# Patient Record
Sex: Male | Born: 1945 | Race: White | Hispanic: No | Marital: Married | Smoking: Former smoker
Health system: Southern US, Community
[De-identification: ages and names within clinical notes are randomized; demographics above are authoritative.]

## PROBLEM LIST (undated history)

## (undated) DIAGNOSIS — J449 Chronic obstructive pulmonary disease, unspecified: Secondary | ICD-10-CM

## (undated) DIAGNOSIS — F419 Anxiety disorder, unspecified: Secondary | ICD-10-CM

## (undated) DIAGNOSIS — Z87442 Personal history of urinary calculi: Secondary | ICD-10-CM

## (undated) DIAGNOSIS — I10 Essential (primary) hypertension: Secondary | ICD-10-CM

## (undated) DIAGNOSIS — C801 Malignant (primary) neoplasm, unspecified: Secondary | ICD-10-CM

## (undated) DIAGNOSIS — R0602 Shortness of breath: Secondary | ICD-10-CM

## (undated) DIAGNOSIS — M1990 Unspecified osteoarthritis, unspecified site: Secondary | ICD-10-CM

## (undated) HISTORY — PX: EYE SURGERY: SHX253

## (undated) HISTORY — PX: OTHER SURGICAL HISTORY: SHX169

## (undated) HISTORY — PX: BACK SURGERY: SHX140

---

## 2001-06-17 ENCOUNTER — Emergency Department (HOSPITAL_COMMUNITY): Admission: EM | Admit: 2001-06-17 | Discharge: 2001-06-17 | Payer: Self-pay | Admitting: Emergency Medicine

## 2003-09-08 ENCOUNTER — Emergency Department (HOSPITAL_COMMUNITY): Admission: EM | Admit: 2003-09-08 | Discharge: 2003-09-08 | Payer: Self-pay | Admitting: Emergency Medicine

## 2003-09-15 ENCOUNTER — Encounter (INDEPENDENT_AMBULATORY_CARE_PROVIDER_SITE_OTHER): Payer: Self-pay | Admitting: Specialist

## 2003-09-15 ENCOUNTER — Ambulatory Visit (HOSPITAL_BASED_OUTPATIENT_CLINIC_OR_DEPARTMENT_OTHER): Admission: RE | Admit: 2003-09-15 | Discharge: 2003-09-15 | Payer: Self-pay | Admitting: Urology

## 2003-09-15 ENCOUNTER — Ambulatory Visit (HOSPITAL_COMMUNITY): Admission: RE | Admit: 2003-09-15 | Discharge: 2003-09-15 | Payer: Self-pay | Admitting: Urology

## 2004-01-12 ENCOUNTER — Emergency Department (HOSPITAL_COMMUNITY): Admission: EM | Admit: 2004-01-12 | Discharge: 2004-01-13 | Payer: Self-pay | Admitting: Emergency Medicine

## 2004-04-16 ENCOUNTER — Ambulatory Visit: Payer: Self-pay | Admitting: Internal Medicine

## 2004-06-26 ENCOUNTER — Ambulatory Visit: Payer: Self-pay | Admitting: Internal Medicine

## 2004-07-05 ENCOUNTER — Ambulatory Visit: Payer: Self-pay | Admitting: Gastroenterology

## 2004-07-17 ENCOUNTER — Ambulatory Visit (HOSPITAL_COMMUNITY): Admission: RE | Admit: 2004-07-17 | Discharge: 2004-07-17 | Payer: Self-pay | Admitting: Gastroenterology

## 2004-07-17 ENCOUNTER — Ambulatory Visit: Payer: Self-pay | Admitting: Gastroenterology

## 2004-08-06 ENCOUNTER — Ambulatory Visit: Payer: Self-pay | Admitting: Internal Medicine

## 2004-09-24 ENCOUNTER — Ambulatory Visit: Payer: Self-pay | Admitting: Internal Medicine

## 2004-10-17 ENCOUNTER — Ambulatory Visit: Payer: Self-pay | Admitting: Internal Medicine

## 2005-01-09 ENCOUNTER — Ambulatory Visit: Payer: Self-pay | Admitting: Internal Medicine

## 2005-01-15 ENCOUNTER — Ambulatory Visit: Payer: Self-pay

## 2005-02-19 ENCOUNTER — Ambulatory Visit: Payer: Self-pay | Admitting: Internal Medicine

## 2005-04-23 ENCOUNTER — Ambulatory Visit: Payer: Self-pay | Admitting: Internal Medicine

## 2005-06-23 ENCOUNTER — Ambulatory Visit: Payer: Self-pay | Admitting: Internal Medicine

## 2005-08-18 ENCOUNTER — Ambulatory Visit: Payer: Self-pay | Admitting: Internal Medicine

## 2005-10-20 ENCOUNTER — Ambulatory Visit: Payer: Self-pay | Admitting: Internal Medicine

## 2005-11-27 ENCOUNTER — Ambulatory Visit: Payer: Self-pay | Admitting: Internal Medicine

## 2006-01-26 ENCOUNTER — Ambulatory Visit: Payer: Self-pay | Admitting: Internal Medicine

## 2006-03-31 ENCOUNTER — Ambulatory Visit: Payer: Self-pay | Admitting: Internal Medicine

## 2006-05-25 ENCOUNTER — Ambulatory Visit: Payer: Self-pay | Admitting: Internal Medicine

## 2006-07-11 ENCOUNTER — Emergency Department (HOSPITAL_COMMUNITY): Admission: EM | Admit: 2006-07-11 | Discharge: 2006-07-11 | Payer: Self-pay | Admitting: Emergency Medicine

## 2006-07-13 ENCOUNTER — Ambulatory Visit: Payer: Self-pay | Admitting: Internal Medicine

## 2006-07-15 ENCOUNTER — Ambulatory Visit: Payer: Self-pay | Admitting: Internal Medicine

## 2006-07-17 ENCOUNTER — Inpatient Hospital Stay (HOSPITAL_COMMUNITY): Admission: EM | Admit: 2006-07-17 | Discharge: 2006-07-20 | Payer: Self-pay | Admitting: Emergency Medicine

## 2006-07-17 ENCOUNTER — Ambulatory Visit: Payer: Self-pay | Admitting: Internal Medicine

## 2006-07-19 ENCOUNTER — Ambulatory Visit: Payer: Self-pay | Admitting: Internal Medicine

## 2006-07-21 ENCOUNTER — Ambulatory Visit: Payer: Self-pay | Admitting: Internal Medicine

## 2006-07-21 ENCOUNTER — Inpatient Hospital Stay (HOSPITAL_COMMUNITY): Admission: EM | Admit: 2006-07-21 | Discharge: 2006-07-25 | Payer: Self-pay | Admitting: Emergency Medicine

## 2006-07-22 ENCOUNTER — Ambulatory Visit: Payer: Self-pay | Admitting: Vascular Surgery

## 2006-07-24 ENCOUNTER — Encounter: Payer: Self-pay | Admitting: Neurosurgery

## 2006-07-29 ENCOUNTER — Ambulatory Visit: Payer: Self-pay | Admitting: Internal Medicine

## 2006-08-14 ENCOUNTER — Ambulatory Visit (HOSPITAL_COMMUNITY): Admission: RE | Admit: 2006-08-14 | Discharge: 2006-08-14 | Payer: Self-pay | Admitting: Neurosurgery

## 2006-08-26 ENCOUNTER — Ambulatory Visit: Payer: Self-pay | Admitting: Internal Medicine

## 2006-09-03 ENCOUNTER — Ambulatory Visit: Payer: Self-pay | Admitting: Cardiology

## 2006-09-03 ENCOUNTER — Inpatient Hospital Stay (HOSPITAL_COMMUNITY): Admission: AC | Admit: 2006-09-03 | Discharge: 2006-09-16 | Payer: Self-pay

## 2006-09-03 ENCOUNTER — Ambulatory Visit: Payer: Self-pay | Admitting: Cardiothoracic Surgery

## 2006-09-04 ENCOUNTER — Ambulatory Visit: Payer: Self-pay | Admitting: Internal Medicine

## 2006-09-16 ENCOUNTER — Ambulatory Visit: Payer: Self-pay | Admitting: Psychiatry

## 2006-09-16 ENCOUNTER — Inpatient Hospital Stay (HOSPITAL_COMMUNITY): Admission: RE | Admit: 2006-09-16 | Discharge: 2006-09-18 | Payer: Self-pay | Admitting: Psychiatry

## 2006-09-24 ENCOUNTER — Ambulatory Visit: Payer: Self-pay | Admitting: Family Medicine

## 2006-10-01 ENCOUNTER — Ambulatory Visit: Payer: Self-pay | Admitting: Family Medicine

## 2006-10-06 ENCOUNTER — Ambulatory Visit (HOSPITAL_COMMUNITY): Admission: RE | Admit: 2006-10-06 | Discharge: 2006-10-06 | Payer: Self-pay | Admitting: Orthopedic Surgery

## 2006-10-12 ENCOUNTER — Ambulatory Visit: Payer: Self-pay | Admitting: Family Medicine

## 2006-10-12 ENCOUNTER — Encounter: Admission: RE | Admit: 2006-10-12 | Discharge: 2006-10-12 | Payer: Self-pay | Admitting: Family Medicine

## 2006-10-13 ENCOUNTER — Encounter: Admission: RE | Admit: 2006-10-13 | Discharge: 2006-10-13 | Payer: Self-pay | Admitting: Family Medicine

## 2006-10-27 ENCOUNTER — Ambulatory Visit: Payer: Self-pay | Admitting: Family Medicine

## 2006-10-28 ENCOUNTER — Encounter: Admission: RE | Admit: 2006-10-28 | Discharge: 2006-10-28 | Payer: Self-pay | Admitting: Family Medicine

## 2006-10-28 ENCOUNTER — Ambulatory Visit: Payer: Self-pay | Admitting: Family Medicine

## 2006-11-05 ENCOUNTER — Ambulatory Visit: Payer: Self-pay | Admitting: Family Medicine

## 2006-11-24 ENCOUNTER — Ambulatory Visit: Payer: Self-pay | Admitting: Cardiology

## 2006-11-24 LAB — CONVERTED CEMR LAB
Basophils Relative: 0.9 % (ref 0.0–1.0)
Bilirubin, Direct: 0.1 mg/dL (ref 0.0–0.3)
CO2: 20 meq/L (ref 19–32)
Cortisol, Plasma: 8.5 ug/dL
Creatinine, Ser: 2 mg/dL — ABNORMAL HIGH (ref 0.4–1.5)
Free T4: 0.9 ng/dL (ref 0.6–1.6)
GFR calc Af Amer: 44 mL/min
Glucose, Bld: 97 mg/dL (ref 70–99)
HCT: 31.4 % — ABNORMAL LOW (ref 39.0–52.0)
Hemoglobin: 10.7 g/dL — ABNORMAL LOW (ref 13.0–17.0)
Lymphocytes Relative: 24.2 % (ref 12.0–46.0)
Monocytes Absolute: 1 10*3/uL — ABNORMAL HIGH (ref 0.2–0.7)
Monocytes Relative: 9.4 % (ref 3.0–11.0)
Neutro Abs: 6.4 10*3/uL (ref 1.4–7.7)
Neutrophils Relative %: 60.1 % (ref 43.0–77.0)
Potassium: 4.7 meq/L (ref 3.5–5.1)
RDW: 16.7 % — ABNORMAL HIGH (ref 11.5–14.6)
Sodium: 134 meq/L — ABNORMAL LOW (ref 135–145)
T3, Free: 2.4 pg/mL (ref 2.3–4.2)
Total Bilirubin: 0.5 mg/dL (ref 0.3–1.2)
Total Protein: 7 g/dL (ref 6.0–8.3)

## 2006-11-25 ENCOUNTER — Ambulatory Visit: Payer: Self-pay | Admitting: Internal Medicine

## 2006-11-26 ENCOUNTER — Ambulatory Visit (HOSPITAL_COMMUNITY): Admission: RE | Admit: 2006-11-26 | Discharge: 2006-11-26 | Payer: Self-pay | Admitting: Cardiology

## 2006-12-01 ENCOUNTER — Ambulatory Visit (HOSPITAL_COMMUNITY): Admission: RE | Admit: 2006-12-01 | Discharge: 2006-12-01 | Payer: Self-pay | Admitting: Internal Medicine

## 2006-12-09 ENCOUNTER — Encounter: Payer: Self-pay | Admitting: Cardiology

## 2006-12-09 ENCOUNTER — Ambulatory Visit: Payer: Self-pay

## 2006-12-10 ENCOUNTER — Ambulatory Visit: Payer: Self-pay | Admitting: Internal Medicine

## 2006-12-16 ENCOUNTER — Ambulatory Visit: Payer: Self-pay | Admitting: Cardiology

## 2006-12-22 ENCOUNTER — Encounter: Payer: Self-pay | Admitting: Internal Medicine

## 2006-12-22 ENCOUNTER — Ambulatory Visit: Payer: Self-pay | Admitting: Internal Medicine

## 2006-12-26 DIAGNOSIS — F329 Major depressive disorder, single episode, unspecified: Secondary | ICD-10-CM

## 2006-12-26 DIAGNOSIS — I10 Essential (primary) hypertension: Secondary | ICD-10-CM

## 2006-12-26 DIAGNOSIS — R51 Headache: Secondary | ICD-10-CM | POA: Insufficient documentation

## 2006-12-26 DIAGNOSIS — K219 Gastro-esophageal reflux disease without esophagitis: Secondary | ICD-10-CM

## 2006-12-26 DIAGNOSIS — R519 Headache, unspecified: Secondary | ICD-10-CM | POA: Insufficient documentation

## 2007-01-04 ENCOUNTER — Ambulatory Visit: Payer: Self-pay | Admitting: Family Medicine

## 2007-01-18 ENCOUNTER — Ambulatory Visit: Payer: Self-pay | Admitting: Family Medicine

## 2007-01-26 ENCOUNTER — Encounter: Admission: RE | Admit: 2007-01-26 | Discharge: 2007-04-26 | Payer: Self-pay | Admitting: Family Medicine

## 2007-02-09 ENCOUNTER — Ambulatory Visit: Payer: Self-pay | Admitting: Family Medicine

## 2007-04-01 ENCOUNTER — Inpatient Hospital Stay (HOSPITAL_COMMUNITY): Admission: AD | Admit: 2007-04-01 | Discharge: 2007-04-03 | Payer: Self-pay | Admitting: General Surgery

## 2007-04-05 ENCOUNTER — Ambulatory Visit: Payer: Self-pay | Admitting: Family Medicine

## 2007-05-21 ENCOUNTER — Encounter (INDEPENDENT_AMBULATORY_CARE_PROVIDER_SITE_OTHER): Payer: Self-pay | Admitting: Urology

## 2007-05-21 ENCOUNTER — Ambulatory Visit (HOSPITAL_BASED_OUTPATIENT_CLINIC_OR_DEPARTMENT_OTHER): Admission: RE | Admit: 2007-05-21 | Discharge: 2007-05-21 | Payer: Self-pay | Admitting: Urology

## 2007-07-28 ENCOUNTER — Emergency Department (HOSPITAL_COMMUNITY): Admission: EM | Admit: 2007-07-28 | Discharge: 2007-07-28 | Payer: Self-pay | Admitting: Emergency Medicine

## 2007-10-02 DIAGNOSIS — F1010 Alcohol abuse, uncomplicated: Secondary | ICD-10-CM | POA: Insufficient documentation

## 2007-10-02 DIAGNOSIS — E785 Hyperlipidemia, unspecified: Secondary | ICD-10-CM | POA: Insufficient documentation

## 2007-10-02 DIAGNOSIS — R0602 Shortness of breath: Secondary | ICD-10-CM

## 2007-10-02 DIAGNOSIS — R5383 Other fatigue: Secondary | ICD-10-CM | POA: Insufficient documentation

## 2007-10-02 DIAGNOSIS — K222 Esophageal obstruction: Secondary | ICD-10-CM | POA: Insufficient documentation

## 2007-10-02 DIAGNOSIS — C679 Malignant neoplasm of bladder, unspecified: Secondary | ICD-10-CM | POA: Insufficient documentation

## 2007-10-02 DIAGNOSIS — S21109A Unspecified open wound of unspecified front wall of thorax without penetration into thoracic cavity, initial encounter: Secondary | ICD-10-CM | POA: Insufficient documentation

## 2007-10-02 DIAGNOSIS — R5381 Other malaise: Secondary | ICD-10-CM | POA: Insufficient documentation

## 2007-10-02 DIAGNOSIS — K208 Other esophagitis: Secondary | ICD-10-CM

## 2007-10-02 DIAGNOSIS — K449 Diaphragmatic hernia without obstruction or gangrene: Secondary | ICD-10-CM | POA: Insufficient documentation

## 2010-06-30 ENCOUNTER — Encounter: Payer: Self-pay | Admitting: Internal Medicine
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
## 2010-10-22 NOTE — Discharge Summary (Signed)
[name], [name]                ACCOUNT NO.:  [id number]   MEDICAL RECORD NO.:  [id number]          PATIENT TYPE:  INP   LOCATION:  3025                         FACILITY:  [facility]   PHYSICIAN:  Coletta Memos, M.D.     DATE OF BIRTH:  02-Mar-1946   DATE OF ADMISSION:  07/22/2006  DATE OF DISCHARGE:  07/25/2006                               DISCHARGE SUMMARY   ADMISSION DIAGNOSIS:  Right lower extremity pain.   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   PROCEDURE:  Right L3-4 semihemilaminectomy and diskectomy.   DISCHARGE STATUS:  Alive and well.   DISCHARGE DESTINATION:  Home.   MEDICATIONS:  Include:  1. Percocet.  2. Flexeril.   COMPLICATIONS:  None.  Wound clean, dry, and no signs of infection.   INDICATION:  [name] presented to the hospital on July 24, 2006.  He was at [facility] where he was admitted and I was asked to provide a  consultation secondary to right lower extremity pain.  A scan was done  and it showed that he had a herniated disk at L3-4 on the right side  which I felt was causing his pain in a far lateral position.  He  initially did not want surgery, then he went ahead and wanted to try an  epidural steroid injection.  He changed his mind and then decided he  wanted to have surgery.  He was actually admitted on July 22, 2006.  The fact that he was transferred to [facility] does not mean he was admitted  on the 15th since this is one hospital system, he was never discharged  from [facility].           ______________________________  Coletta Memos, M.D.     KC/MEDQ  D:  01/08/2007  T:  01/09/2007  Job:  [id number]

## 2010-10-22 NOTE — Assessment & Plan Note (Signed)
[facility]                            CARDIOLOGY OFFICE NOTE   NAME:Belknap, Nida Boatman                       MRN:          [id number]  DATE:12/16/2006                            DOB:          30-Jun-1945    FOLLOWUP VISIT   PRIMARY CARE PHYSICIAN:  Sharlot Gowda, M.D.   REASON FOR VISIT:  Followup testing.   HISTORY OF PRESENT ILLNESS:  I saw Mr. Hritz back in June.  His history  is detailed in my previous note.  I referred him for a number of studies  for further investigation of general weakness, dyspnea on exertion, and  increased heart rate.  From a cardiac perspective, he underwent an  echocardiogram, which demonstrated normal left ventricular systolic  function at 55-60% with possible mild hypokinesis of the basal inferior  posterior wall, but no major valvular abnormalities.  He did have  evidence of mild diastolic dysfunction.  An adenosine Myoview did not  indicate any obvious ischemia.  His ejection fraction was 59% and he had  some decreased activity in the inferior wall, which could have been  related to soft tissue attenuation, less likely an element of scarring.  These studies were, overall, low risk, and I reviewed these with the  patient and his daughter today.  He is not reporting any active chest  pain and actually states that he feels better after we cut back his  enalapril and discontinued his triamterine hydrochlorothiazide.  I also  had him undergo a ventilation perfusion lung scan, which was low  probability for pulmonary embolus.  These studies, including a chest x-  ray, suggested underlying emphysema.  Followup blood work showed a mild  anemia with a hemoglobin of 10.7, WBCs 10.7, platelets 501,000, sodium  134, potassium 4.7, BUN and creatinine 28 and 2.0, which was down from a  creatinine high of 2.4.  His TSH was normal at 1.94, and his liver  function tests were also normal.  Microscopic urinalysis showed no white  cells  or red cells.  His urine sodium was 114.  In reviewing all of this  information, I doubt that we need to proceed with any additional cardiac  testing.  He has a relatively low blood pressure at baseline, and some  degree of orthostasis on his last visit.  I suspect that he may have  been intravascularly volume depleted, and at this point I would not  reinstitute diuretic therapy.  It may even be that he needs to  discontinue enalapril all together.   ALLERGIES:  MORPHINE.   PRESENT MEDICATIONS:  1. Megace 625 mg p.o. daily.  2. Citalopram 4 mg p.o. daily.  3. Enalapril 10 mg p.o. daily.   REVIEW OF SYSTEMS:  As described in the history of present illness.   EXAMINATION:  Blood pressure is 104/66, heart rate is 88, weight is 138  pounds.  Of note, his weight has increased 5 pounds since his last visit  and his heart rate has come down nicely from 106 to 88.  There has been no major change in his baseline examination other  than  this.   IMPRESSION/RECOMMENDATIONS:  1. Relative fatigue, dyspnea on exertion, and elevated heart rate,      largely improved in the last few weeks.  I suspect that      intravascular volume depletion was at least a fair part of these      symptoms based on testing and his progress following      discontinuation of diuretic therapy and decrease in his enalapril.      I doubt that additional cardiac testing is required at this point,      based on his relatively low risk studies.  I have recommended that      he continue to follow up with Dr. Susann Givens for close blood pressure      evaluation.  It may be that he needs to be taken off of enalapril      all together, although at this point he seems to still be      improving.  I would not reinstitute diuretics at this point.  His      heart rate has improved and he had no evidence of thromboembolic      disease based on a low-probability ventilation perfusion scan.  We      can see him back as needed.  2.  Continue regular followup with Dr. Susann Givens.     Jonelle Sidle, MD  Electronically Signed    SGM/MedQ  DD: 12/16/2006  DT: 12/17/2006  Job #: [id number]   cc:   Sharlot Gowda, M.D.
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
## 2010-10-22 NOTE — Op Note (Signed)
[name], [name]                ACCOUNT NO.:  [id number]   MEDICAL RECORD NO.:  [id number]          PATIENT TYPE:  AMB   LOCATION:  [facility]                         FACILITY:  [facility]   PHYSICIAN:  Mark C. Vernie Ammons, M.D.  DATE OF BIRTH:  [birth year]   DATE OF PROCEDURE:  05/21/2007  DATE OF DISCHARGE:                               OPERATIVE REPORT   PREOPERATIVE DIAGNOSIS:  Recurrent bladder tumor.   POSTOPERATIVE DIAGNOSIS:  Recurrent bladder tumor.   PROCEDURES:  1. Cystoscopy with urethral dilatation.  2. Transurethral resection of bladder tumor (approximately 1 cm).  3. Bladder biopsies.   SURGEON:  Mark C. Vernie Ammons, M.D.   ANESTHESIA:  General.   SPECIMENS:  1. Bladder tumor.  2. Cold cup biopsy of posterior wall.  3. Cold cup biopsy near right ureteral orifice.   All to pathology.   BLOOD LOSS:  Minimal.   DRAINS:  None.   COMPLICATIONS:  None.   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  It was found at the time of an incidental ureteroscopy for a right  ureteral stone.  He was scheduled for surveillance cystoscopy, however  failed to return.  When he returned to my office earlier this month,  cystoscopy was performed and the bladder tumor was identified.  We  discussed the risks, complications and alternatives to surgical  resection of the tumor.  He understands and has elected to proceed.   DESCRIPTION OF OPERATION:  After informed consent, the patient brought  to the major OR, placed on the table, administered general anesthesia,  then moved to the dorsal lithotomy position.  His genitalia was  sterilely prepped and draped and an official time-out was performed.   I initially attempted to pass the 28-French resectoscope with the  Timberlake obturator but the meatus would not accept this easily.  I  therefore dilated with Sissy Hoff sounds starting at 24-French  and  progressing to 30-French.  I was then able to pass the sheath with  obturator.  I then removed the obturator and inserted the resectoscope  element with 30-degree lens and then passed the scope on down the  urethra, through the prostatic urethra into the bladder.   The bladder was then fully inspected.  I found the tumor on the right  wall.  It did not appear to involve the bladder neck.  It was papillary  in configuration, and then I noted some areas that were slightly redder  than the surrounding mucosa, primarily near the right ureteral orifice,  posterior and lateral to that.  There were no definite papillary tumors.  There was also with a patch of this reddened area on the posterior wall  just to the right of midline.   Resection was then performed of the bladder tumor.  Once this was fully  resected, I then fulgurated the edges and the base.   The cold cup biopsy forceps were then inserted and I obtained a cold cup  biopsy of the reddened the  area of the posterior wall just to the right  of midline and also a biopsy just superior and lateral to the right  ureteral orifice.  I then reinserted the resectoscope element and  fulgurated these two areas, and the remaining areas of slight reddened  mucosa were fully fulgurated until no areas were left untreated in the  bladder.  I then drained the bladder and the specimens were sent to  pathology.  At the end of the procedure there was some bleeding from the  meatus at the 6 o'clock position that was treated with gauze and  pressure with good control.  The patient tolerated the procedure well.  There were no intraoperative complications.   He will be given a prescription for 24 Vicodin HP and 24 Pyridium Plus.  He will be discharged from the recovery area with written instructions  and follow up in my office in 1-2 weeks to discuss the pathology report  and further treatment based upon that.      Mark C. Vernie Ammons, M.D.   Electronically Signed     MCO/MEDQ  D:  05/21/2007  T:  05/21/2007  Job:  [id number]

## 2010-10-25 NOTE — Op Note (Signed)
[name], [name]                          ACCOUNT NO.:  [id number]   MEDICAL RECORD NO.:  [id number]                   PATIENT TYPE:  AMB   LOCATION:  [facility]C                                 FACILITY:  [facility]   PHYSICIAN:  Mark C. Vernie Ammons, M.D.               DATE OF BIRTH:  [birth year]   DATE OF PROCEDURE:  09/15/2003  DATE OF DISCHARGE:                                 OPERATIVE REPORT   PREOPERATIVE DIAGNOSIS:  Right distal ureteral calculus.   POSTOPERATIVE DIAGNOSES:  1. Right distal ureteral calculus.  2. Bladder tumor (approximately 2 cm).   OPERATION/PROCEDURE:  1. Cystoscopy.  2. Right retrograde pyelogram with interpretation.  3. Right ureteroscopy with laser lithotripsy of stone.  4. Stent placement.  5. Transurethral resection of bladder tumor.   SURGEON:  Mark C. Vernie Ammons, M.D.   ASSISTANT:  Thyra Breed, M.D.   ANESTHESIA:  General.   DRAINS:  18-French Foley catheter.   ESTIMATED BLOOD LOSS:  Approximately 5 mL.   SPECIMENS:  Bladder tumor to pathology.   COMPLICATIONS:  None.   INDICATIONS:  The patient is a 65 year old white male who was seen in office  consultation today for further evaluation of right flank pain with radiation  into the testis and associated gross hematuria and irritative voiding  symptoms.  His CT scan revealed hydronephrosis, bilateral renal calculi and  a right renal cyst as well as 5 mm stone in the distal right ureter.  He is  brought to the OR today for ureteroscopic extraction of his stone.  He  understands the risks, complications, alternatives, and limitations.   DESCRIPTION OF PROCEDURE:  After informed consent was obtained, the patient  was brought to the major OR and placed on the table, administered general  anesthesia, then moved to the dorsal lithotomy position.  His genitalia was  sterilely prepped and draped.  Initially a 23-French cystoscope with 12-  degree lens was inserted in the bladder.  The bladder was fully  inspected  and noted to have a large bladder tumor that was papillary in configuration  but there was some nodularity just posterior to it.  It encompassed the area  of the right ureteral orifice and extended to near the bladder neck level at  the 8 o'clock position.  The patient was administered indigo carmine IV and  the remainder of the bladder was then fully inspected with both 70-degree  and 12-degree lenses and noted to be free of any further tumor, stones or  inflammatory lesions.   The right orifice was easily identified and I observed for an extended  period of time the area that I felt was consistent with a right ureteral  orifice and eventually saw some blue dye emanating from this region.  I was  able to get an open-ended ureteral catheter into this area, injected  contrast and determined that this was in fact the right ureteral orifice.  I  was unable to negotiate a guidewire up the right ureter through the open-  ended stent, so I removed the cystoscope and stent and inserted the 6-French  rigid ureteroscope.  I was able to identify the orifice and passed the  ureteroscope into the orifice and identified the stone impacted in the  distal ureter.  I tried to pass a guidewire through the scope and around the  stone but was unsuccessful.  I, therefore, used the holmium laser to  fragment the stone completely.  I was able to get the scope past the stone  and noted no lesions within the ureter.  The stone was fully fragmented and  I, therefore, passed a guidewire through the ureteroscope into the renal  pelvis.  The scope was backed out leaving the guidewire in place and noted  there did not appear to be any intraluminal filling defects or abnormalities  in the intramural ureteral region.   The open-ended ureteral stent was then passed over the guidewire into the  renal pelvis.  The guidewire was removed and then I inserted the  resectoscope.  I used the resectoscope to resect  the tumor from the bladder  and completely around the ureteral orifice.  I used only pure cut in the  area of the orifice.  To prevent any risk of scarring over the tube, did  encompass the entire ureteral orifice.  I resected circumferentially around  this and then up to the bladder neck at the 8 o'clock position where the  tumor extended.  After I was complete, all visible papillary tumor was  completely resected.  I fulgurated the base and all bleeders.  I then used  the [facility] evacuator to remove all of the bladder tumor from the bladder.  I  then reinspected and noted the bladder to be intact with no perforation.  I  backloaded the cystoscope over the open-ended stent, passed the guidewire up  the ureter, removed the open-ended stent and passed a double-J stent into  the renal pelvis.  A 68-French 66 year old stent was used.  No string was  left affixed.  I removed the guidewire with a good curl being noted in the  bladder and renal pelvis.  I then drained the bladder and put an 18-French  Foley catheter in.  This was connected to closed system drainage.   Mitomycin 40 mL in 40 mL of saline was then instilled in the bladder and  maintained in the recovery room.  The catheter will be removed and the  bladder irrigated and the patient will be given a voiding trial prior to his  discharge.  I will contact him with the results of his pathology and plan to  see him back in the office in approximately one week.  He has a previously  prescribed for [facility].  I will give him a prescription for  Pyridium.                                               Mark C. Vernie Ammons, M.D.    MCO/MEDQ  D:  09/15/2003  T:  09/15/2003  Job:  [id number]

## 2010-10-25 NOTE — H&P (Signed)
[name], [name]                ACCOUNT NO.:  [id number]   MEDICAL RECORD NO.:  [id number]          PATIENT TYPE:  INP   LOCATION:  0115                         FACILITY:  [facility]   PHYSICIAN:  Georgina Quint. Plotnikov, MDDATE OF BIRTH:  [birth year]   DATE OF ADMISSION:  07/21/2006  DATE OF DISCHARGE:                              HISTORY & PHYSICAL   CHIEF COMPLAINT:  Severe pain in the back, severe pain in the right  thigh. I am ready to shoot a bullet in my head. He started to complain  of chest/epigastric pain earlier today. They are nonexertional and get  better with drinking water.   HISTORY OF PRESENT ILLNESS:  The patient is a 65 year old male with  about 2 weeks history of severe refractory to outpatient treatment low  back pain and right leg pain. He was hospitalized prior and on last  weekend for pain control and a workup. MRI did not show any significant  pathology. He was seen by Dr. Lequita Halt and discharged with  recommendations to see a neurosurgeon, Dr. Venetia Maxon. His family brings the  patient back in with the above complaint. He is totally miserable,  unable to function. He has baseline severe pain in the back and in the  leg and severe jolt of sharp pain when he moves around.   Past medical history, allergies, social history, family history, review  of systems, and current medicines as per my history and physical on  July 17, 2006.   CURRENT MEDICATIONS:  Reviewed and he is currently taking Duragesic,  oxycodone and diazepam for back pain.   REVIEW OF SYSTEMS:  Severe pain as above. Pain medications do not work.  He is mixed up in days due to exhausting pain and pain medications,  unable to sleep. He sounds quite desperate. The rest of the 18-point  review of systems is fairly negative.   PHYSICAL EXAMINATION:  VITAL SIGNS:  Blood pressure 145/86, temperature  99.1, pulse 104, weight 164 pounds.  GENERAL:  He is in moderate acute distress due to pain, flushed  face,  dysphoric.  HEENT:  Slightly dryish oral mucosa.  NECK:  Supple.  LUNGS:  Clear.  HEART:  Regular.  ABDOMEN:  Soft, nontender.  EXTREMITIES:  Lower extremity is without edema. Calves nontender. LS  spine tender to palpation. Right thigh anteriorly slightly tender to  palpation. When asked to walk to the exam table, he gets a spell of pain  that almost puts him down on his knees. Straight leg elevation is  negative. Hip, pelvis and thigh exam fairly unremarkable. He is very  uncomfortable when trying to lay down or get up from supine position.  SKIN:  Without rash.   ASSESSMENT/PLAN:  1. Severe refractory low back pain/right lower extremity pain. His      previous hospitalization MRI results were reviewed with the      patient. Will have to readmit for pain control and neurosurgical      consultation. Will ask Dr. Venetia Maxon or his colleagues to evaluate the      patient. May need a CT myelogram or  other test to define his      pathology      a little better. Will increase Duragesic patch.  2. Atypical chest pain. Will admit to telemetry, rule out myocardial      infarction, cardiac enzymes x3, EKG, D-dimer, start aspirin,      Lovenox.      Georgina Quint. Plotnikov, MD  Electronically Signed     AVP/MEDQ  D:  07/21/2006  T:  07/22/2006  Job:  [id number]

## 2010-10-25 NOTE — Discharge Summary (Signed)
[name], [name]                ACCOUNT NO.:  [id number]   MEDICAL RECORD NO.:  [id number]          PATIENT TYPE:  INP   LOCATION:  1514                         FACILITY:  [facility]   PHYSICIAN:  [name], MDDATE OF BIRTH:  [birth year]   DATE OF ADMISSION:  07/17/2006  DATE OF DISCHARGE:  07/20/2006                               DISCHARGE SUMMARY   DISCHARGE DIAGNOSES:  1. Severe and intractable right greater than left lower extremity pain      with low back pain, no definitive cause identified, see details      below.  Continue pain management with outpatient orthopedic      followup.  2. Shallow L5-S1 right disk protrusion, no definite entrapment, no      spinal stenosis on MRI February 9, done at [facility].  3. Mild dehydration with acute renal insufficiency resolved status      post IV fluid, discharge creatinine 1.1.  4. Hypertension.  5. Hyperglycemia secondary to steroids.  Outpatient followup to      include checking A1c.  6. Tobacco abuse, the patient educated on need for cessation.  7. Constipation likely narcotic-induced.   DISCHARGE MEDICATIONS:  1. Duragesic 50 mcg patch change q. 72 hours.  2. Prednisone taper 60 mg today then 50 and 40, etc. status post IV      Solu-Medrol dosing this admission.  3. Robaxin 750 mg 1 t.i.d. scheduled for spasms the next 3 days then      p.r.n.  Other medications are as prior to admission without change and include:  1. Valium 5 mg p.r.n. anxiety.  2. Elavil 25 mg q.h.s.  3. Enalapril 20 mg b.i.d.  4. Oxycodone 15 mg 1-2 q.i.d. p.r.n. breakthrough pain.  5. Omeprazole 40 mg p.o. q.a.m.  6. Maxzide 37.5/25 p.o. q.a.m.  7. Atenolol 25 mg b.i.d.  8. Wellbutrin SR 150 mg p.o. b.i.d.   Hospital followup is to be arranged with primary care physician, Dr.  Sonda Primes, to call for appointment in 2 weeks or as needed.  Also  with orthopedist Dr. Ollen Gross to call for appointment in 3-4 weeks  or as needed.   CONDITION ON DISCHARGE:  Pain improved, understands plans for followup  as needed.   HOSPITAL COURSE:  1. Intractable low back pain with right greater than left extremity      pain.  The patient is a 65 year old gentleman with a history of      hypertension and ongoing tobacco abuse who has had progressive pain      in his back down his right leg through his hip causing spasm from      his quad and above his knee.  He has been treated on outpatient      basis with empiric prednisone, Valium, and increasing doses of      oxycodone by his primary MD with little improvement or change in      the nature of his help.  Because of persisting pain with failure to      respond to outpatient conservative medical management, he was  admitted for further evaluation and control of his pain.  An MRI      was ordered which initially could not be performed due to the      patient's inability to lie flat due to pain.  He was treated with      high-dose IV Solu-Medrol as well as IV Dilaudid and the placement      of a Duragesic patch this hospitalization which did improve his      pain.  An MRI was performed February 9 which was suboptimal due to      motion artifact but with adequate to deny the presence of spinal      stenosis or a disk herniation.  There was mild L5-S1 protrusion on      the right without impingement or edema.  However, as this was      located on the right side where a majority of the patient's      complaints were located, it was felt appropriate to be seen in      consultation by orthopedics and Dr. Lequita Halt, who was on call, saw      the patient for further evaluation.  He also ordered a plain film      of the pelvis and hip to rule out osteoarthritis or avascular      necrosis, both of which were excluded by normal findings on plain      film.  I also placed the patient on scheduled Robaxin 750 t.i.d.      for the spasms, the combination of which has seemed to have       improved his pain.  His IV Solu-Medrol was tapered and then changed      back to p.o. prednisone on the day of discharge.  He still has      flares of his pain, but orthopedics feels at this time there is no      particularly convincing explanation for the patient's source of      pain with relatively benign MRI findings and negative x-ray films.      Continue current pain management as tolerated with plans for      outpatient followup to consider an epidural steroid injection in      this region should pain persist.  The patient is agreeable to this      plan and anxious for discharge home.  We will continue the      Duragesic as well as recurring prednisone taper as prior to      admission and Robaxin for muscle spasm.  The patient is allowed to      remain taking his Valium additionally for anxiety component related      to pain and oxycodone p.r.n. breakthrough pain above and beyond      that which is already covered with the above.  2. Other medical issues.  The patient's other medical issues remained      stable this hospitalization.  He did receive IV fluids while      holding his Maxzide due to mild dehydration but at that time of      discharge, is resumed on all previous medications.      Connor A. Felicity Coyer, MD  Electronically Signed     VAL/MEDQ  D:  07/20/2006  T:  07/20/2006  Job:  [id number]

## 2010-10-25 NOTE — Op Note (Signed)
[name], [name]                ACCOUNT NO.:  [id number]   MEDICAL RECORD NO.:  [id number]          PATIENT TYPE:  INP   LOCATION:  3025                         FACILITY:  [facility]   PHYSICIAN:  Coletta Memos, M.D.     DATE OF BIRTH:  05/23/1946   DATE OF PROCEDURE:  07/24/2006  DATE OF DISCHARGE:                               OPERATIVE REPORT   PREOPERATIVE DIAGNOSES:  1. Displaced disk right L3-4.  2. 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   POSTOPERATIVE DIAGNOSES:  1. Displaced disk right L3-4.  2. 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   PROCEDURE:  Right L3-4 semihemilaminectomy and diskectomy with  microdissection.   COMPLICATIONS:  None.   SURGEON:  Coletta Memos, M.D.   ASSISTANT:  Clydene Fake, M.D.   INDICATIONS:  [name] is a 65 year old gentleman who has been an  inpatient at [facility] transferred today.  He has been in  severe pain, has had two previous ER visits within the last three weeks  secondary to severe pain in his back and right lower extremity.  I  therefore recommended after reviewing his scans which showed a lateral  disk herniation at L3-4 and I thought operative decompression would be  best.  He has agreed.   OPERATIVE NOTE:  [name] was brought to the operating room intubated  and placed under general anesthetic without difficulty.  He was rolled  prone onto a Wilson frame and all pressure points were properly padded.  His back was prepped and he was draped in sterile fashion.  I  infiltrated 10 mL 0.5% lidocaine with 1:200,000 strength epinephrine  into the lumbar region.  I opened the skin with a #10 blade and took  this down to the thoracolumbar fascia.  I then exposed the lamina of L3  and L4, confirming our location with an intraoperative x-ray.  I then  performed a semihemilaminectomy of L3 using a high-speed drill and  Kerrison punches.  I removed the ligamentum flavum and expose the thecal  sac.  I brought the microscope into the operative  field.  I then was  able to retract the thecal sac medially and expose what was a very large  disk herniation.  With Dr. Doreen Beam assistance, we then removed the disk  in a progressive fashion using pituitary rongeurs, Kerrison punches and  Epstein curettes.  I decompressed the disk space until I had felt that  there was no pressure whatsoever left on the L3 or L4 nerve roots.  Bleeding was controlled.  I then irrigated the wound.  I then closed  wound in layered fashion.  Microdissection was used in order to protect  thecal sac and to thoroughly decompress both the thecal sac and L3-L4  nerve roots.  I  irrigated the wound.  I then closed the wound in layered fashion using  Vicryl sutures to reapproximate the thoracolumbar fascia, subcuticular  tissue, subcutaneous layer and subcuticular layer.  Dermabond was used  for sterile dressing.  The patient was extubated moving all extremities  postop.           ______________________________  Ronaldo Miyamoto  Franky Macho, M.D.     KC/MEDQ  D:  07/24/2006  T:  07/25/2006  Job:  [id number]

## 2010-10-25 NOTE — Consult Note (Signed)
[name]                ACCOUNT NO.:  [id number]   MEDICAL RECORD NO.:  [id number]          PATIENT TYPE:  INP   LOCATION:  1429                         FACILITY:  [facility]   PHYSICIAN:  Coletta Memos, M.D.     DATE OF BIRTH:  1945-07-08   DATE OF CONSULTATION:  07/22/2006  DATE OF DISCHARGE:                                 CONSULTATION   CHIEF COMPLAINT:  Right lower extremity pain.   INDICATIONS:  [name] is a 65 year old gentleman who first started  having pain on July 10, 2006.  At that point in time, he stated his  back and right leg had been hurting.  He was moving a cabinet in his  garage and stacked it.  Said that the pain did not start then but  started approximately a day later.  When he came into the emergency  room, he came in at 0108.  He was holding his back while ambulating, by  their account, to steady out his gait.  Family is at his bedside.  The  pain has been unrelenting at that time.  He had two separate admissions,  one on February 8th and the second on February 12th.  He has had a  number of tests to try to identify the etiology of his pain, including  CT angio of the chest.  He had hip x-rays.  He had a consultation by Dr.  Despina Hick.  He had an MRI which was read as not showing a clear-cut  etiology for his pain.  This was secondary to motion degradation of the  film; however, the MRI on my review certainly suggested a right L3-4  lateral disk.  He had a plain CT of the lumbar spine performed today.  That, in my point of view, confirms the right L3-4 lateral disk  herniation.  The radiologist, Dr. Chestine Spore, has read it the same.   [name] today is much more comfortable.  He is being treated with IV  pain medication.   CURRENT MEDICATIONS:  Morphine, Duragesic patch, oxycodone, Protonix,  Phenergan, Valium, aspirin, Lovenox.  He had been on steroids  previously.  Family reports he has had hiccups since the steroids have  started.   He has no  known drug allergies.   Home medications include Elavil, atenolol, bupropion, Valium, enalapril,  methocarbamol, prednisone, triamterene, hydrochlorothiazide, Xanax.   PAST MEDICAL HISTORY:  Hypertension.  He smokes.  Drinks occasionally.  He is on a full Lovenox protocol.   He is married.  He has two sons.   PHYSICAL EXAMINATION:  VITAL SIGNS:  Temperature 98.4, pulse 102,  respiratory rate 18, pulse oxygenation with 2 liters nasal cannula is  approximately 96%.  He had been in the 80s without nasal cannula.  Systolic blood pressure 128, diastolic 86.  He is 5 feet 6 inches tall,  weighing 72 kg.  GENERAL:  He is alert and oriented x4.  Answers all questions  appropriately.  He is in obvious distress.  When he stands, he favors  significantly to the right lower extremity, almost hopping on that side.  He had full strength in the right lower extremity on manual examination.  Reflexes are 2+ at the knees and ankles bilaterally.  Intact  proprioception.  Intact light touch.  Multiple tone, bulk, coordination  are normal.  He has symmetric facial movements.  Hearing intact to  finger rub bilaterally.  Full extraocular motions with his pupils.  Tongue and uvula in the midline.  Shoulder shrug is normal.  Strength  5/5 in the upper extremities.  LUNGS:  Clear.  HEART:  Regular rate and rhythm.  No murmurs or rubs appreciated.  EXTREMITIES:  Right groin inspection, where he is also complaining of  some pain, showed no abnormalities nor discoloration.  No edema was  present.   DIAGNOSIS:  1. Right far lateral disk herniation, L3-4.  2. Right L3 radiculopathy.   I spoke with [name] and his family for a long period of time.  We  went over the options, which I think are reasonable at this point in  time.  I told them that I think surgery is his best option right now  because he seems to be so miserable.  Secondary to the pain, I think an  operation could be safely done to remove the  disk fragment and relieve  the pressure on the nerve root and allow him to improve with regards to  the pain.  We also discussed a steroid injection, epidural steroid  injection to that L3-4 area.  This is what he would actually like to  pursue, as he is not at this time enthusiastic about an operation.  He  fully understands that it may take a while for that to take effect, and  it may not work at all, but there is little to no risk involved in  getting that injection.  I will go ahead and write an order for the  epidural steroid injection.  I have called Dr. Posey Rea and will speak  with him with regards to my findings, assessment, and plan.           ______________________________  Coletta Memos, M.D.     KC/MEDQ  D:  07/22/2006  T:  07/22/2006  Job:  [id number]

## 2010-10-25 NOTE — H&P (Signed)
[name], RESS                ACCOUNT NO.:  [id number]   MEDICAL RECORD NO.:  [id number]          PATIENT TYPE:  INP   LOCATION:  0103                         [facility]AC[facility]  [facility]   PHYSICIAN:  Georgina Quint. Plotnikov, MDDATE OF BIRTH:  Nov 14, 1945   DATE OF ADMISSION:  07/17/2006  DATE OF DISCHARGE:                              HISTORY & PHYSICAL   CHIEF COMPLAINT:  Severe back pain and pain in the legs.   HISTORY OF PRESENT ILLNESS:  The patient is a 65 year old male with one  week history of increasingly worsening pain in the back and in the right  leg.  He rates his pain at 11/10.  He has not been able to sleep over  the past several nights.  Pain medicines were of very little help.  Yesterday we started him on a high dose of oral prednisone, Valium, and  higher dosages of oxycodone.  He may be feeling a tad better.  He was  early in the office and received an injection of Demerol/Phenergan.  Continues to be in a lot of pain.  We will admit him for pain control.   PAST MEDICAL HISTORY:  1. Chronic osteoarthritis.  2. Hypertension.  3. Tobacco smoking.  4. Depression.   ALLERGIES:  No known drug allergies.   SOCIAL HISTORY:  He is married.  He is on disability.  Lives with his  wife.  He is a smoker.  Denies alcohol.   FAMILY HISTORY:  Positive for heart disease.   REVIEW OF SYSTEMS:  No chest pain or shortness of breath.  He has been  constipated lately.  Pain, as above.  The rest of the 18-point review of  systems is negative.  No urinary retention.   CURRENT MEDICATIONS:  Clonidine.  Other blood pressure meds not  available at present.  Oxycodone 15 mg q.i.d.  Diazepam 10 mg 1-2 b.i.d.  Prednisone taper.   PHYSICAL EXAMINATION:  VITAL SIGNS:  Blood pressure 118/75, pulse 92,  temperature 97.3.  GENERAL:  He is in mild acute distress due to pain.  Very difficult to  ambulate.  Severe pain with minimal motion in the back.  HEENT:  Moist mucosa.  NECK:  Supple.  LUNGS:  Clear.  No wheezes.  HEART:  S1 and S2.  No gallop.  ABDOMEN:  Soft, nontender.  No organomegaly.  No mass felt.  EXTREMITIES:  Lower extremities without edema.  Calves nontender.  Straight leg elevation is positive, actually, bilaterally.  LS tender  with minimal range of motion.  Paraspinal muscles dense and tender.  SKIN:  Without rashes.  NEUROLOGIC:  Alert, oriented and cooperative.   LABS:  None available.   ASSESSMENT/PLAN:  1. Severe low back pain with right-sided radiculopathy refractory to      outpatient treatment.  Will switch to IV steroids.  MRI of the LS-      spine tonight.  May need a neurosurgical/orthopedic surgical      consult.  2. Radiculopathy:  Plan as above.  IV morphine.  Duragesic patch.      Diazepam p.r.n. spasms.  3. Hypertension:  Continue current therapy.  4. Tobacco abuse:  Will use a patch.      Georgina Quint. Plotnikov, MD  Electronically Signed     AVP/MEDQ  D:  07/17/2006  T:  07/17/2006  Job:  [id number]
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
## 2010-11-01 ENCOUNTER — Other Ambulatory Visit: Payer: Self-pay | Admitting: Gastroenterology

## 2011-02-28 LAB — DIFFERENTIAL
Basophils Absolute: 0.1
Lymphocytes Relative: 24
Lymphs Abs: 2.3
Monocytes Absolute: 0.7
Neutro Abs: 6.1

## 2011-02-28 LAB — COMPREHENSIVE METABOLIC PANEL
Albumin: 4.1
BUN: 8
Calcium: 9.4
Chloride: 101
Creatinine, Ser: 1.12
GFR calc non Af Amer: >60
Total Bilirubin: 0.4

## 2011-02-28 LAB — SAMPLE TO BLOOD BANK

## 2011-02-28 LAB — CBC
HCT: 42.3
MCHC: 33.6
MCV: 92.1
Platelets: 406 — ABNORMAL HIGH
RDW: 16.7 — ABNORMAL HIGH
WBC: 9.4

## 2011-02-28 LAB — URINALYSIS, ROUTINE W REFLEX MICROSCOPIC
Ketones, ur: NEGATIVE
Nitrite: NEGATIVE
Protein, ur: NEGATIVE

## 2011-02-28 LAB — LIPASE, BLOOD: Lipase: 31

## 2011-03-17 LAB — POCT HEMOGLOBIN-HEMACUE
Hemoglobin: 13.4
Operator id: 268271

## 2011-03-19 LAB — CBC
MCHC: 34
MCV: 92.2
Platelets: 444 — ABNORMAL HIGH
RDW: 14.5 — ABNORMAL HIGH

## 2011-03-19 LAB — COMPREHENSIVE METABOLIC PANEL
AST: 17
Albumin: 4
Calcium: 9.8
Creatinine, Ser: 1.11
GFR calc Af Amer: >60
GFR calc non Af Amer: >60
Total Protein: 6.6

## 2011-03-19 LAB — DIFFERENTIAL
Eosinophils Relative: 3
Lymphocytes Relative: 20
Lymphs Abs: 2
Monocytes Absolute: 1 — ABNORMAL HIGH

## 2011-05-03 ENCOUNTER — Emergency Department (HOSPITAL_COMMUNITY): Payer: Medicare Other

## 2011-05-03 ENCOUNTER — Encounter: Payer: Self-pay | Admitting: Nurse Practitioner

## 2011-05-03 ENCOUNTER — Emergency Department (HOSPITAL_COMMUNITY)
Admission: EM | Admit: 2011-05-03 | Discharge: 2011-05-03 | Disposition: A | Discharge status: Home / Self Care | Payer: Medicare Other | Attending: Emergency Medicine | Admitting: Emergency Medicine

## 2011-05-03 DIAGNOSIS — M545 Low back pain, unspecified: Secondary | ICD-10-CM | POA: Insufficient documentation

## 2011-05-03 DIAGNOSIS — M549 Dorsalgia, unspecified: Secondary | ICD-10-CM

## 2011-05-03 DIAGNOSIS — I10 Essential (primary) hypertension: Secondary | ICD-10-CM | POA: Insufficient documentation

## 2011-05-03 DIAGNOSIS — G8929 Other chronic pain: Secondary | ICD-10-CM | POA: Insufficient documentation

## 2011-05-03 DIAGNOSIS — M79609 Pain in unspecified limb: Secondary | ICD-10-CM | POA: Insufficient documentation

## 2011-05-03 DIAGNOSIS — M5380 Other specified dorsopathies, site unspecified: Secondary | ICD-10-CM | POA: Insufficient documentation

## 2011-05-03 DIAGNOSIS — M25569 Pain in unspecified knee: Secondary | ICD-10-CM | POA: Insufficient documentation

## 2011-05-03 DIAGNOSIS — Z79899 Other long term (current) drug therapy: Secondary | ICD-10-CM | POA: Insufficient documentation

## 2011-05-03 HISTORY — DX: Essential (primary) hypertension: I10

## 2011-05-03 MED ORDER — TRAMADOL HCL 50 MG PO TABS: 50.0000 mg | ORAL_TABLET | Freq: Four times a day (QID) | ORAL | Status: AC | PRN

## 2011-05-03 MED ORDER — METHOCARBAMOL 500 MG PO TABS: 500.0000 mg | ORAL_TABLET | Freq: Two times a day (BID) | ORAL | Status: AC

## 2011-05-03 MED ORDER — KETOROLAC TROMETHAMINE 60 MG/2ML IM SOLN
60.0000 mg | Freq: Once | INTRAMUSCULAR | Status: AC
  Administered 2011-05-03: 60 mg via INTRAMUSCULAR
  Filled 2011-05-03: qty 2

## 2011-05-03 MED ORDER — DEXAMETHASONE SODIUM PHOSPHATE 10 MG/ML IJ SOLN
10.0000 mg | Freq: Once | INTRAMUSCULAR | Status: AC
  Administered 2011-05-03: 10 mg via INTRAMUSCULAR
  Filled 2011-05-03: qty 1

## 2011-05-03 MED ORDER — METHOCARBAMOL 500 MG PO TABS
500.0000 mg | ORAL_TABLET | Freq: Once | ORAL | Status: AC
  Administered 2011-05-03: 500 mg via ORAL
  Filled 2011-05-03: qty 1

## 2011-05-03 NOTE — ED Provider Notes (Signed)
History     CSN: [id number] Arrival date & time: 05/03/2011  9:31 AM   First MD Initiated Contact with Patient 05/03/11 1007     HPI [name] is a 65 y.o. male complaining of bilateral lower back pain. States he has a significant history of chronic back pain for several years. Most recent episode of back pain began yesterday and has worsened since. States pain radiates to bilateral lower extremities down to knees. Otherwise denies numbness, tingling, weakness, saddle anesthesias, or perineal numbness, abdominal pain, urinary symptoms, fever, new injury. Patient replies he is using Goody powders for pain without relief. States pain is typical for his chronic back pain. Patient is a 65 y.o. male presenting with back pain. The history is provided by the patient.  Back Pain  This is a chronic problem. The current episode started yesterday. The problem occurs constantly. The problem has been gradually worsening. The pain is associated with no known injury. The pain is present in the lumbar spine. The quality of the pain is described as stabbing and shooting. The pain radiates to the right knee, right thigh, left knee and left thigh. The pain is severe. The symptoms are aggravated by certain positions, twisting and bending. Pertinent negatives include no chest pain, no fever, no numbness, no headaches, no abdominal pain, no bowel incontinence, no perianal numbness, no bladder incontinence, no dysuria, no pelvic pain, no leg pain, no paresthesias, no paresis, no tingling and no weakness. He has tried NSAIDs for the symptoms. The treatment provided no relief.    Past Medical History  Diagnosis Date  . Hypertension     History reviewed. No pertinent past surgical history.  History reviewed. No pertinent family history.  History  Substance Use Topics  . Smoking status: Current Everyday Smoker -- 1.0 packs/day  . Smokeless tobacco: Not on file  . Alcohol Use: No      Review of Systems    Constitutional: Negative for fever and chills.  HENT: Negative for neck pain.   Respiratory: Negative for cough and shortness of breath.   Cardiovascular: Negative for chest pain and palpitations.  Gastrointestinal: Negative for nausea, vomiting, abdominal pain and bowel incontinence.  Genitourinary: Negative for bladder incontinence, dysuria, hematuria, flank pain and pelvic pain.  Musculoskeletal: Positive for back pain. Negative for myalgias and gait problem.       Denies saddle anesthesias, perineal numbness, bowel incontinence, urinary incontinence  Neurological: Negative for dizziness, tingling, weakness, numbness, headaches and paresthesias.  All other systems reviewed and are negative.    Allergies  Review of patient's allergies indicates no known allergies.  Home Medications   Current Outpatient Rx  Name Route Sig Dispense Refill  . AMLODIPINE BESYLATE 5 MG PO TABS Oral Take 5 mg by mouth daily.      Marland Kitchen VALSARTAN 160 MG PO TABS Oral Take 160 mg by mouth daily.        BP 144/83  Pulse 91  Temp(Src) 97.9 F (36.6 C) (Oral)  Resp 20  SpO2 98%  Physical Exam  Constitutional: He is oriented to person, place, and time. He appears well-developed and well-nourished.  HENT:  Head: Normocephalic and atraumatic.  Eyes: Conjunctivae are normal. Pupils are equal, round, and reactive to light.  Neck: Normal range of motion. Neck supple.  Cardiovascular: Normal rate, regular rhythm and normal heart sounds.   Pulmonary/Chest: Effort normal and breath sounds normal.  Abdominal: Soft. Bowel sounds are normal.  Musculoskeletal:  Thoracic back: Normal.       Lumbar back: He exhibits decreased range of motion, bony tenderness, pain and spasm. He exhibits no swelling, no edema, no deformity, no laceration and normal pulse.       Pain mildly reproduced with palpation of the lumbar paraspinal area. States pain will radiate down bilateral lower extremities. Positive straight leg  raise. Full range of motion however of the lower extremities. Normal distal pulses and sensation. Normal strength distally. Ambulation normal.  Neurological: He is alert and oriented to person, place, and time.  Skin: Skin is warm and dry. No rash noted. No erythema. No pallor.  Psychiatric: He has a normal mood and affect. His behavior is normal.    ED Course  Procedures   Dg Lumbar Spine Complete  05/03/2011  *RADIOLOGY REPORT*  Clinical Data:  lower back pain  LUMBAR SPINE - COMPLETE 4+ VIEW  Comparison: 07/28/2007  Findings: Normal alignment of the lumbar spine.  The vertebral body heights and disc spaces are well preserved.  Mild multilevel disc space narrowing and ventral endplate spurring is noted.  The facet joints are well aligned.  IMPRESSION:  1.  No acute findings. 2.  Lumbar spondylosis noted.  Original Report Authenticated By: Rosealee Albee, M.D.      MDM   Thomasene Lot, PA 05/03/11 1116  Thomasene Lot, [place] 05/03/11 1146

## 2011-05-03 NOTE — ED Notes (Signed)
C/o lower back, hip and BLE pain onset yesterday am, reports hx of back pain. Denies new injuries. Ambulatory, MAE. Denies bowel/bladder changes.

## 2011-05-03 NOTE — ED Provider Notes (Signed)
Evaluation and management procedures were performed by the PA/NP under my supervision/collaboration.   Geneva Barrero, MD 05/03/11 1547 

## 2011-05-04 MED ORDER — BACLOFEN 10 MG PO TABS: 10.0000 mg | ORAL_TABLET | Freq: Three times a day (TID) | ORAL | Status: AC

## 2011-05-04 NOTE — ED Provider Notes (Signed)
pts insurance does not cover robaxin or flexiril, requesting baclofen.  Dorthula Matas, PA 05/04/11 2020

## 2011-05-05 NOTE — ED Notes (Signed)
Called in an Rx for Baclofen 10 mg, 1 tab TID, x 30 tabs.  Prescribed by T. Neva Seat PA.  Called into [facility] at [phone] due to expense of Robaxin & Flexeril.

## 2011-05-06 NOTE — ED Provider Notes (Signed)
Evaluation and management procedures were performed by the PA/NP under my supervision/collaboration.   Dione Booze, MD 05/06/11 [phone]

## 2013-10-03 ENCOUNTER — Emergency Department (HOSPITAL_COMMUNITY)
Admission: EM | Admit: 2013-10-03 | Discharge: 2013-10-03 | Disposition: A | Discharge status: Home / Self Care | Payer: Medicare Other | Attending: Emergency Medicine | Admitting: Emergency Medicine

## 2013-10-03 ENCOUNTER — Encounter (HOSPITAL_COMMUNITY): Payer: Self-pay | Admitting: Emergency Medicine

## 2013-10-03 DIAGNOSIS — Z79899 Other long term (current) drug therapy: Secondary | ICD-10-CM | POA: Insufficient documentation

## 2013-10-03 DIAGNOSIS — X503XXA Overexertion from repetitive movements, initial encounter: Secondary | ICD-10-CM | POA: Insufficient documentation

## 2013-10-03 DIAGNOSIS — Y9389 Activity, other specified: Secondary | ICD-10-CM | POA: Insufficient documentation

## 2013-10-03 DIAGNOSIS — X500XXA Overexertion from strenuous movement or load, initial encounter: Secondary | ICD-10-CM | POA: Insufficient documentation

## 2013-10-03 DIAGNOSIS — IMO0002 Reserved for concepts with insufficient information to code with codable children: Secondary | ICD-10-CM | POA: Insufficient documentation

## 2013-10-03 DIAGNOSIS — F17200 Nicotine dependence, unspecified, uncomplicated: Secondary | ICD-10-CM | POA: Insufficient documentation

## 2013-10-03 DIAGNOSIS — Y9289 Other specified places as the place of occurrence of the external cause: Secondary | ICD-10-CM | POA: Insufficient documentation

## 2013-10-03 DIAGNOSIS — M549 Dorsalgia, unspecified: Secondary | ICD-10-CM

## 2013-10-03 DIAGNOSIS — I10 Essential (primary) hypertension: Secondary | ICD-10-CM | POA: Insufficient documentation

## 2013-10-03 DIAGNOSIS — G8929 Other chronic pain: Secondary | ICD-10-CM

## 2013-10-03 DIAGNOSIS — Z9889 Other specified postprocedural states: Secondary | ICD-10-CM | POA: Insufficient documentation

## 2013-10-03 MED ORDER — KETOROLAC TROMETHAMINE 60 MG/2ML IM SOLN
60.0000 mg | Freq: Once | INTRAMUSCULAR | Status: DC
  Filled 2013-10-03: qty 2

## 2013-10-03 NOTE — ED Notes (Signed)
Pt reports lower back since Thursday. Is chronic but got worse after he did heavy lifting. Is ambulatory. Denies urinary or bowel issues. Is a x 4.

## 2013-10-03 NOTE — Discharge Instructions (Signed)
Back Pain, Adult Low back pain is very common. About 1 in 5 people have back pain.The cause of low back pain is rarely dangerous. The pain often gets better over time.About half of people with a sudden onset of back pain feel better in just 2 weeks. About 8 in 10 people feel better by 6 weeks.  CAUSES Some common causes of back pain include:  Strain of the muscles or ligaments supporting the spine.  Wear and tear (degeneration) of the spinal discs.  Arthritis.  Direct injury to the back. DIAGNOSIS Most of the time, the direct cause of low back pain is not known.However, back pain can be treated effectively even when the exact cause of the pain is unknown.Answering your caregiver's questions about your overall health and symptoms is one of the most accurate ways to make sure the cause of your pain is not dangerous. If your caregiver needs more information, he or she may order lab work or imaging tests (X-rays or MRIs).However, even if imaging tests show changes in your back, this usually does not require surgery. HOME CARE INSTRUCTIONS For many people, back pain returns.Since low back pain is rarely dangerous, it is often a condition that people can learn to [facility] their own.   Remain active. It is stressful on the back to sit or stand in one place. Do not sit, drive, or stand in one place for more than 30 minutes at a time. Take short walks on level surfaces as soon as pain allows.Try to increase the length of time you walk each day.  Do not stay in bed.Resting more than 1 or 2 days can delay your recovery.  Do not avoid exercise or work.Your body is made to move.It is not dangerous to be active, even though your back may hurt.Your back will likely heal faster if you return to being active before your pain is gone.  Pay attention to your body when you bend and lift. Many people have less discomfortwhen lifting if they bend their knees, keep the load close to their bodies,and  avoid twisting. Often, the most comfortable positions are those that put less stress on your recovering back.  Find a comfortable position to sleep. Use a firm mattress and lie on your side with your knees slightly bent. If you lie on your back, put a pillow under your knees.  Only take over-the-counter or prescription medicines as directed by your caregiver. Over-the-counter medicines to reduce pain and inflammation are often the most helpful.Your caregiver may prescribe muscle relaxant drugs.These medicines help dull your pain so you can more quickly return to your normal activities and healthy exercise.  Put ice on the injured area.  Put ice in a plastic bag.  Place a towel between your skin and the bag.  Leave the ice on for 15-20 minutes, 03-04 times a day for the first 2 to 3 days. After that, ice and heat may be alternated to reduce pain and spasms.  Ask your caregiver about trying back exercises and gentle massage. This may be of some benefit.  Avoid feeling anxious or stressed.Stress increases muscle tension and can worsen back pain.It is important to recognize when you are anxious or stressed and learn ways to manage it.Exercise is a great option. SEEK MEDICAL CARE IF:  You have pain that is not relieved with rest or medicine.  You have pain that does not improve in 1 week.  You have new symptoms.  You are generally not feeling well. SEEK  IMMEDIATE MEDICAL CARE IF:   You have pain that radiates from your back into your legs.  You develop new bowel or bladder control problems.  You have unusual weakness or numbness in your arms or legs.  You develop nausea or vomiting.  You develop abdominal pain.  You feel faint. Document Released: 05/26/2005 Document Revised: 11/25/2011 Document Reviewed: 10/14/2010 [employer] Patient Information 2014 [place], [place].  Chronic Back Pain  When back pain lasts longer than 3 months, it is called chronic back pain.People with  chronic back pain often go through certain periods that are more intense (flare-ups).  CAUSES Chronic back pain can be caused by wear and tear (degeneration) on different structures in your back. These structures include:  The bones of your spine (vertebrae) and the joints surrounding your spinal cord and nerve roots (facets).  The strong, fibrous tissues that connect your vertebrae (ligaments). Degeneration of these structures may result in pressure on your nerves. This can lead to constant pain. HOME CARE INSTRUCTIONS  Avoid bending, heavy lifting, prolonged sitting, and activities which make the problem worse.  Take brief periods of rest throughout the day to reduce your pain. Lying down or standing usually is better than sitting while you are resting.  Take over-the-counter or prescription medicines only as directed by your caregiver. SEEK IMMEDIATE MEDICAL CARE IF:   You have weakness or numbness in one of your legs or feet.  You have trouble controlling your bladder or bowels.  You have nausea, vomiting, abdominal pain, shortness of breath, or fainting. Document Released: 07/03/2004 Document Revised: 08/18/2011 Document Reviewed: 05/10/2011 [facility] Patient Information 2014 [place], [place].

## 2013-10-03 NOTE — ED Notes (Signed)
Gwenlyn Perking, PA at bedside for evaluation.

## 2013-10-03 NOTE — ED Provider Notes (Signed)
CSN: [id number]     Arrival date & time 10/03/13  1245 History  This chart was scribed for non-physician practitioner, Michele Mcalpine, PA-C, working with Wandra Arthurs, MD, by Sydell Axon, ED Scribe. This patient was seen in room TR06C/TR06C and the patient's care was started at 2:33 PM.  The history is provided by the patient. No language interpreter was used.   HPI Comments: [name] is a 68 y.o. male who presents to the Emergency Department with a chief complaint of chronic lower back pain with the most recent episode occuring 4 days ago. Patient reports symptoms worsened following heavy lifting occuring 4 days ago when he felt his back give out. He characterizes his pain as a sharp pain which shoots down to his legs, bilaterally. Additionally, patient reports that symptoms are made worse with certain movements and changes in position (twisting/bending). Patient is ambulatory. He denies any CP, SOB, bowel or bladder incontinence, numbness/tingling, fever, night sweats. Patient reports taking 1 hydrocodone/day for his chronic low back pain and states this has provided minimal relief for this new symptom presentation. PCP is at [facility].  Past Medical History  Diagnosis Date  . Hypertension    Past Surgical History  Procedure Laterality Date  . Back surgery     No family history on file. History  Substance Use Topics  . Smoking status: Current Every Day Smoker -- 1.00 packs/day  . Smokeless tobacco: Not on file  . Alcohol Use: No   Review of Systems  Constitutional: Negative for fever and chills.  Respiratory: Negative for shortness of breath.   Cardiovascular: Negative for chest pain.  Gastrointestinal: Negative for nausea, vomiting, abdominal pain and diarrhea.  Musculoskeletal: Positive for back pain. Negative for gait problem, neck pain and neck stiffness.  Neurological: Negative for dizziness, weakness, numbness and headaches.  A complete 10 system review of systems  was obtained and all systems are negative except as noted in the HPI and PMH.   Allergies  Review of patient's allergies indicates no known allergies.  Home Medications   Prior to Admission medications   Medication Sig Start Date End Date Taking? Authorizing Provider  amLODipine (NORVASC) 5 MG tablet Take 5 mg by mouth daily.      Historical Provider, MD  valsartan (DIOVAN) 160 MG tablet Take 160 mg by mouth daily.      Historical Provider, MD   Triage Vitals: BP 157/86  Pulse 103  Temp(Src) 98.1 F (36.7 C) (Oral)  Wt 147 lb (66.679 kg)  SpO2 97%  Physical Exam  Nursing note and vitals reviewed. Constitutional: He is oriented to person, place, and time. He appears well-developed and well-nourished. No distress.  HENT:  Head: Normocephalic and atraumatic.  Mouth/Throat: Oropharynx is clear and moist.  Eyes: Conjunctivae are normal.  Neck: Normal range of motion. Neck supple. No spinous process tenderness and no muscular tenderness present.  Cardiovascular: Normal rate, regular rhythm and normal heart sounds.   Pulmonary/Chest: Effort normal and breath sounds normal. No respiratory distress.  Musculoskeletal: Normal range of motion. He exhibits no edema and no tenderness.  Lower lumbar muscle nontender. No spinous process tenderness.  Neurological: He is alert and oriented to person, place, and time. He has normal strength.  Strength lower extremities 5/5 and equal bilateral. Sensation intact. Slow, but normal gait.  Skin: Skin is warm and dry. No rash noted. He is not diaphoretic.  Psychiatric: He has a normal mood and affect. His behavior is normal.  ED Course  Procedures (including critical care time)  COORDINATION OF CARE: 2:44 PM-Recommended patient to follow up with PCP to have an MRI performed. Treatment plan discussed with patient and patient agrees.  Labs Review Labs Reviewed - No data to display  Imaging Review No results found.   EKG Interpretation None       MDM   Final diagnoses:  Chronic back pain   Patient presenting with worsening chronic back pain after a mechanical injury of lifting. He is well appearing and in no apparent distress. No tachycardia on my exam. No red flags concerning patient's back pain. No s/s of central cord compression or cauda equina. Lower extremities are neurovascularly intact and patient is able to ambulate. He received narcotic pain medications, 30 day supply at a time from PCP. I discussed that he will need to call his PCP for further Pain medicine. Pt refusing toradol. Stable for discharge. Return precautions given. Patient states understanding of treatment care plan and is agreeable.    I personally performed the services described in this documentation, which was scribed in my presence. The recorded information has been reviewed and is accurate.    Illene Labrador, PA-C 10/03/13 [place], PA-C 10/03/13 [phone]

## 2013-10-04 NOTE — ED Provider Notes (Signed)
Medical screening examination/treatment/procedure(s) were performed by non-physician practitioner and as supervising physician I was immediately available for consultation/collaboration.   EKG Interpretation None        Wandra Arthurs, MD 10/04/13 [phone]

## 2013-11-02 ENCOUNTER — Other Ambulatory Visit (HOSPITAL_COMMUNITY): Payer: Self-pay | Admitting: Internal Medicine

## 2013-11-02 ENCOUNTER — Ambulatory Visit (HOSPITAL_COMMUNITY)
Admission: RE | Admit: 2013-11-02 | Discharge: 2013-11-02 | Disposition: A | Discharge status: Home / Self Care | Payer: Medicare Other | Source: Ambulatory Visit | Attending: Internal Medicine | Admitting: Internal Medicine

## 2013-11-02 DIAGNOSIS — R079 Chest pain, unspecified: Secondary | ICD-10-CM | POA: Insufficient documentation

## 2013-11-02 DIAGNOSIS — J209 Acute bronchitis, unspecified: Secondary | ICD-10-CM

## 2013-11-06 ENCOUNTER — Inpatient Hospital Stay (HOSPITAL_COMMUNITY)
Admission: EM | Admit: 2013-11-06 | Discharge: 2013-11-07 | DRG: 191 | Disposition: A | Discharge status: Home Health | Payer: Medicare Other | Attending: Internal Medicine | Admitting: Internal Medicine

## 2013-11-06 ENCOUNTER — Encounter (HOSPITAL_COMMUNITY): Payer: Self-pay | Admitting: Emergency Medicine

## 2013-11-06 ENCOUNTER — Emergency Department (HOSPITAL_COMMUNITY): Payer: Medicare Other

## 2013-11-06 DIAGNOSIS — E871 Hypo-osmolality and hyponatremia: Secondary | ICD-10-CM | POA: Diagnosis present

## 2013-11-06 DIAGNOSIS — D72829 Elevated white blood cell count, unspecified: Secondary | ICD-10-CM | POA: Diagnosis present

## 2013-11-06 DIAGNOSIS — Z79899 Other long term (current) drug therapy: Secondary | ICD-10-CM | POA: Diagnosis not present

## 2013-11-06 DIAGNOSIS — K222 Esophageal obstruction: Secondary | ICD-10-CM

## 2013-11-06 DIAGNOSIS — K208 Other esophagitis without bleeding: Secondary | ICD-10-CM

## 2013-11-06 DIAGNOSIS — F3289 Other specified depressive episodes: Secondary | ICD-10-CM

## 2013-11-06 DIAGNOSIS — S21109A Unspecified open wound of unspecified front wall of thorax without penetration into thoracic cavity, initial encounter: Secondary | ICD-10-CM

## 2013-11-06 DIAGNOSIS — R0902 Hypoxemia: Secondary | ICD-10-CM | POA: Diagnosis present

## 2013-11-06 DIAGNOSIS — R5381 Other malaise: Secondary | ICD-10-CM

## 2013-11-06 DIAGNOSIS — F17200 Nicotine dependence, unspecified, uncomplicated: Secondary | ICD-10-CM | POA: Diagnosis present

## 2013-11-06 DIAGNOSIS — I10 Essential (primary) hypertension: Secondary | ICD-10-CM | POA: Diagnosis present

## 2013-11-06 DIAGNOSIS — E785 Hyperlipidemia, unspecified: Secondary | ICD-10-CM

## 2013-11-06 DIAGNOSIS — R51 Headache: Secondary | ICD-10-CM

## 2013-11-06 DIAGNOSIS — R0602 Shortness of breath: Secondary | ICD-10-CM

## 2013-11-06 DIAGNOSIS — IMO0002 Reserved for concepts with insufficient information to code with codable children: Secondary | ICD-10-CM

## 2013-11-06 DIAGNOSIS — C679 Malignant neoplasm of bladder, unspecified: Secondary | ICD-10-CM

## 2013-11-06 DIAGNOSIS — T380X5A Adverse effect of glucocorticoids and synthetic analogues, initial encounter: Secondary | ICD-10-CM | POA: Diagnosis present

## 2013-11-06 DIAGNOSIS — F1010 Alcohol abuse, uncomplicated: Secondary | ICD-10-CM

## 2013-11-06 DIAGNOSIS — R5383 Other fatigue: Secondary | ICD-10-CM

## 2013-11-06 DIAGNOSIS — K449 Diaphragmatic hernia without obstruction or gangrene: Secondary | ICD-10-CM

## 2013-11-06 DIAGNOSIS — J42 Unspecified chronic bronchitis: Secondary | ICD-10-CM

## 2013-11-06 DIAGNOSIS — F329 Major depressive disorder, single episode, unspecified: Secondary | ICD-10-CM

## 2013-11-06 DIAGNOSIS — J441 Chronic obstructive pulmonary disease with (acute) exacerbation: Principal | ICD-10-CM | POA: Diagnosis present

## 2013-11-06 DIAGNOSIS — K219 Gastro-esophageal reflux disease without esophagitis: Secondary | ICD-10-CM

## 2013-11-06 HISTORY — DX: Malignant (primary) neoplasm, unspecified: C80.1

## 2013-11-06 HISTORY — DX: Anxiety disorder, unspecified: F41.9

## 2013-11-06 HISTORY — DX: Shortness of breath: R06.02

## 2013-11-06 HISTORY — DX: Unspecified osteoarthritis, unspecified site: M19.90

## 2013-11-06 LAB — CBC WITH DIFFERENTIAL/PLATELET
BASOS PCT: 1 % (ref 0–1)
Basophils Absolute: 0.1 10*3/uL (ref 0.0–0.1)
EOS ABS: 0.5 10*3/uL (ref 0.0–0.7)
Eosinophils Relative: 3 % (ref 0–5)
HCT: 41.5 % (ref 39.0–52.0)
Hemoglobin: 14.4 g/dL (ref 13.0–17.0)
Lymphocytes Relative: 13 % (ref 12–46)
Lymphs Abs: 2.1 10*3/uL (ref 0.7–4.0)
MCH: 31.1 pg (ref 26.0–34.0)
MCHC: 34.7 g/dL (ref 30.0–36.0)
MCV: 89.6 fL (ref 78.0–100.0)
Monocytes Absolute: 1.5 10*3/uL — ABNORMAL HIGH (ref 0.1–1.0)
Monocytes Relative: 9 % (ref 3–12)
Neutro Abs: 12.5 10*3/uL — ABNORMAL HIGH (ref 1.7–7.7)
Neutrophils Relative %: 74 % (ref 43–77)
PLATELETS: 497 10*3/uL — AB (ref 150–400)
RBC: 4.63 MIL/uL (ref 4.22–5.81)
RDW: 15.2 % (ref 11.5–15.5)
WBC: 16.6 10*3/uL — AB (ref 4.0–10.5)

## 2013-11-06 LAB — BASIC METABOLIC PANEL
BUN: 11 mg/dL (ref 6–23)
CO2: 24 mEq/L (ref 19–32)
CREATININE: 0.95 mg/dL (ref 0.50–1.35)
Calcium: 9.8 mg/dL (ref 8.4–10.5)
Chloride: 95 mEq/L — ABNORMAL LOW (ref 96–112)
GFR calc Af Amer: >90 mL/min (ref >90)
GFR, EST NON AFRICAN AMERICAN: 83 mL/min — AB (ref >90)
Glucose, Bld: 107 mg/dL — ABNORMAL HIGH (ref 70–99)
Potassium: 4.2 mEq/L (ref 3.7–5.3)
SODIUM: 133 meq/L — AB (ref 137–147)

## 2013-11-06 LAB — TROPONIN I

## 2013-11-06 LAB — PRO B NATRIURETIC PEPTIDE: Pro B Natriuretic peptide (BNP): 133 pg/mL — ABNORMAL HIGH (ref 0–125)

## 2013-11-06 MED ORDER — ACETAMINOPHEN 325 MG PO TABS
650.0000 mg | ORAL_TABLET | Freq: Four times a day (QID) | ORAL | Status: DC | PRN
  Administered 2013-11-06 – 2013-11-07 (×2): 650 mg via ORAL
  Filled 2013-11-06 (×2): qty 2

## 2013-11-06 MED ORDER — FOLIC ACID 1 MG PO TABS
1.0000 mg | ORAL_TABLET | Freq: Every day | ORAL | Status: DC
  Administered 2013-11-06 – 2013-11-07 (×2): 1 mg via ORAL
  Filled 2013-11-06 (×2): qty 1

## 2013-11-06 MED ORDER — SODIUM CHLORIDE 0.9 % IJ SOLN: 3.0000 mL | Freq: Two times a day (BID) | INTRAMUSCULAR | Status: DC

## 2013-11-06 MED ORDER — LEVOFLOXACIN IN D5W 750 MG/150ML IV SOLN
750.0000 mg | Freq: Once | INTRAVENOUS | Status: AC
  Administered 2013-11-06: 750 mg via INTRAVENOUS
  Filled 2013-11-06: qty 150

## 2013-11-06 MED ORDER — IPRATROPIUM BROMIDE 0.02 % IN SOLN
0.5000 mg | Freq: Once | RESPIRATORY_TRACT | Status: DC
  Administered 2013-11-06: 0.5 mg via RESPIRATORY_TRACT
  Filled 2013-11-06: qty 2.5

## 2013-11-06 MED ORDER — HYDROCODONE-ACETAMINOPHEN 5-325 MG PO TABS
1.0000 | ORAL_TABLET | Freq: Every day | ORAL | Status: DC
  Administered 2013-11-06 – 2013-11-07 (×2): 1 via ORAL
  Filled 2013-11-06 (×2): qty 1

## 2013-11-06 MED ORDER — PREDNISONE 20 MG PO TABS
60.0000 mg | ORAL_TABLET | Freq: Once | ORAL | Status: AC
  Administered 2013-11-06: 60 mg via ORAL
  Filled 2013-11-06: qty 3

## 2013-11-06 MED ORDER — IPRATROPIUM-ALBUTEROL 0.5-2.5 (3) MG/3ML IN SOLN
3.0000 mL | RESPIRATORY_TRACT | Status: DC
  Administered 2013-11-06 – 2013-11-07 (×2): 3 mL via RESPIRATORY_TRACT
  Filled 2013-11-06 (×2): qty 3

## 2013-11-06 MED ORDER — SENNOSIDES-DOCUSATE SODIUM 8.6-50 MG PO TABS: 1.0000 | ORAL_TABLET | Freq: Every evening | ORAL | Status: DC | PRN

## 2013-11-06 MED ORDER — BIOTENE DRY MOUTH MT LIQD
15.0000 mL | Freq: Two times a day (BID) | OROMUCOSAL | Status: DC
  Administered 2013-11-06 – 2013-11-07 (×2): 15 mL via OROMUCOSAL

## 2013-11-06 MED ORDER — LEVOFLOXACIN IN D5W 750 MG/150ML IV SOLN
750.0000 mg | INTRAVENOUS | Status: DC
  Administered 2013-11-07: 750 mg via INTRAVENOUS
  Filled 2013-11-06 (×2): qty 150

## 2013-11-06 MED ORDER — SODIUM CHLORIDE 0.9 % IV SOLN: INTRAVENOUS | Status: DC

## 2013-11-06 MED ORDER — ACETAMINOPHEN 650 MG RE SUPP: 650.0000 mg | Freq: Four times a day (QID) | RECTAL | Status: DC | PRN

## 2013-11-06 MED ORDER — ONDANSETRON HCL 4 MG PO TABS: 4.0000 mg | ORAL_TABLET | Freq: Four times a day (QID) | ORAL | Status: DC | PRN

## 2013-11-06 MED ORDER — BENZONATATE 100 MG PO CAPS
100.0000 mg | ORAL_CAPSULE | Freq: Three times a day (TID) | ORAL | Status: DC | PRN
  Filled 2013-11-06: qty 1

## 2013-11-06 MED ORDER — PREDNISONE 50 MG PO TABS
60.0000 mg | ORAL_TABLET | Freq: Every day | ORAL | Status: DC
  Administered 2013-11-07: 60 mg via ORAL
  Filled 2013-11-06 (×2): qty 1

## 2013-11-06 MED ORDER — IPRATROPIUM BROMIDE 0.02 % IN SOLN
0.5000 mg | RESPIRATORY_TRACT | Status: DC
  Administered 2013-11-06: 0.5 mg via RESPIRATORY_TRACT
  Filled 2013-11-06: qty 2.5

## 2013-11-06 MED ORDER — ALBUTEROL SULFATE (2.5 MG/3ML) 0.083% IN NEBU
5.0000 mg | INHALATION_SOLUTION | Freq: Once | RESPIRATORY_TRACT | Status: AC
  Administered 2013-11-06: 5 mg via RESPIRATORY_TRACT
  Filled 2013-11-06: qty 6

## 2013-11-06 MED ORDER — VITAMIN B-1 100 MG PO TABS
100.0000 mg | ORAL_TABLET | Freq: Every day | ORAL | Status: DC
  Administered 2013-11-06 – 2013-11-07 (×2): 100 mg via ORAL
  Filled 2013-11-06 (×2): qty 1

## 2013-11-06 MED ORDER — SODIUM CHLORIDE 0.9 % IV SOLN
INTRAVENOUS | Status: DC
  Administered 2013-11-06: 15:00:00 via INTRAVENOUS

## 2013-11-06 MED ORDER — ALBUTEROL SULFATE (2.5 MG/3ML) 0.083% IN NEBU
5.0000 mg | INHALATION_SOLUTION | RESPIRATORY_TRACT | Status: DC
  Administered 2013-11-06 (×2): 5 mg via RESPIRATORY_TRACT
  Filled 2013-11-06 (×2): qty 6

## 2013-11-06 MED ORDER — ENOXAPARIN SODIUM 40 MG/0.4ML ~~LOC~~ SOLN
40.0000 mg | SUBCUTANEOUS | Status: DC
  Administered 2013-11-06: 40 mg via SUBCUTANEOUS
  Filled 2013-11-06 (×2): qty 0.4

## 2013-11-06 MED ORDER — ONDANSETRON HCL 4 MG/2ML IJ SOLN: 4.0000 mg | Freq: Four times a day (QID) | INTRAMUSCULAR | Status: DC | PRN

## 2013-11-06 MED ORDER — IPRATROPIUM BROMIDE 0.02 % IN SOLN
0.5000 mg | Freq: Once | RESPIRATORY_TRACT | Status: AC
  Administered 2013-11-06: 0.5 mg via RESPIRATORY_TRACT
  Filled 2013-11-06: qty 2.5

## 2013-11-06 MED ORDER — ALBUTEROL SULFATE (2.5 MG/3ML) 0.083% IN NEBU
2.5000 mg | INHALATION_SOLUTION | RESPIRATORY_TRACT | Status: DC | PRN
Start: 2013-11-06 — End: 2013-11-07

## 2013-11-06 NOTE — ED Provider Notes (Signed)
CSN: [id number]     Arrival date & time 11/06/13  1138 History   First MD Initiated Contact with Patient 11/06/13 1148     Chief Complaint  Patient presents with  . Cough  . Shortness of Breath     (Consider location/radiation/quality/duration/timing/severity/associated sxs/prior Treatment) HPI Comments: Patient with no previous history of COPD however with prolonged history of smoking -- presents with complaint of shortness of breath and cough for the past 3 weeks. Cough is productive of green sputum. Patient is unable to do much activity at home since this began because he becomes very short of breath with any sort of activity. Patient denies fever, chest pain, nausea or vomiting. No abdominal pain or urinary symptoms. Patient was recently placed on a "Z-Pak" which he finished taking today. He has received no relief from this or from the [place] he was prescribed. Patient is not currently on oxygen at home. He is at 90% on room air upon arrival to the emergency department. Onset of symptoms acute. Course is constant. Activity makes the symptoms worse. Nothing makes it better.  The history is provided by the patient and the spouse.    Past Medical History  Diagnosis Date  . Hypertension    Past Surgical History  Procedure Laterality Date  . Back surgery     No family history on file. History  Substance Use Topics  . Smoking status: Current Every Day Smoker -- 1.00 packs/day  . Smokeless tobacco: Not on file  . Alcohol Use: No    Review of Systems  Constitutional: Negative for fever.  HENT: Negative for rhinorrhea and sore throat.   Eyes: Negative for redness.  Respiratory: Positive for cough and shortness of breath. Negative for chest tightness and wheezing.   Cardiovascular: Negative for chest pain and leg swelling.  Gastrointestinal: Negative for nausea, vomiting, abdominal pain and diarrhea.  Genitourinary: Negative for dysuria.  Musculoskeletal: Negative for myalgias.   Skin: Negative for rash.  Neurological: Negative for headaches.   Allergies  Review of patient's allergies indicates no known allergies.  Home Medications   Prior to Admission medications   Medication Sig Start Date End Date Taking? Authorizing Provider  amLODipine (NORVASC) 5 MG tablet Take 5 mg by mouth daily.      Historical Provider, MD  HYDROcodone-acetaminophen (NORCO/VICODIN) 5-325 MG per tablet Take 1 tablet by mouth daily.    Historical Provider, MD  valsartan (DIOVAN) 160 MG tablet Take 160 mg by mouth daily.      Historical Provider, MD   BP 130/90  Pulse 89  Temp(Src) 98.1 F (36.7 C) (Oral)  Resp 20  SpO2 90%  Physical Exam  Nursing note and vitals reviewed. Constitutional: He appears well-developed and well-nourished.  HENT:  Head: Normocephalic and atraumatic.  Mouth/Throat: Oropharynx is clear and moist.  Eyes: Conjunctivae are normal. Right eye exhibits no discharge. Left eye exhibits no discharge.  Neck: Normal range of motion. Neck supple. No JVD present.  Cardiovascular: Normal rate, regular rhythm and normal heart sounds.   No murmur heard. Pulmonary/Chest: Effort normal. No respiratory distress. He has wheezes (mild, scattered, end-expiratory). He has no rales.  Abdominal: Soft. There is no tenderness. There is no rebound and no guarding.  Neurological: He is alert.  Skin: Skin is warm and dry.  Psychiatric: He has a normal mood and affect.    ED Course  Procedures (including critical care time) Labs Review Labs Reviewed  CBC WITH DIFFERENTIAL - Abnormal; Notable for the following:  WBC 16.6 (*)    Platelets 497 (*)    Neutro Abs 12.5 (*)    Monocytes Absolute 1.5 (*)    All other components within normal limits  BASIC METABOLIC PANEL - Abnormal; Notable for the following:    Sodium 133 (*)    Chloride 95 (*)    Glucose, Bld 107 (*)    GFR calc non Af Amer 83 (*)    All other components within normal limits    Imaging Review Dg Chest  2 View  11/06/2013   CLINICAL DATA:  Cough and shortness of breath for 3 weeks  EXAM: CHEST  2 VIEW  COMPARISON:  11/02/2013  FINDINGS: The heart size and vascular pattern are normal. There is hyperinflation consistent with COPD. There is mild to moderate bilateral bronchitic change. There is no consolidation or effusion. Interstitial changes in the perihilar areas are progressive when compared to 2008.  IMPRESSION: No evidence of bacterial pneumonia. There is COPD with mild to moderate bilateral bronchitic change.   Electronically Signed   By: Skipper Cliche M.D.   On: 11/06/2013 13:48     EKG Interpretation None      Patient seen and examined. Work-up initiated. EKG reviewed. Medications ordered. D/w Dr. Venora Maples who will see.   Vital signs reviewed and are as follows: Filed Vitals:   11/06/13 1146  BP: 130/90  Pulse: 89  Temp: 98.1 F (36.7 C)  Resp: 20   2:23 PM Pt very SOB with ambulation with desat to 87%. Patient agreeable to admission.   2:38 PM Spoke with Dr. Tyrell Antonio who will see.   MDM   Final diagnoses:  Hypoxia  Chronic bronchitis   Patient likely with COPD exacerbation. Admit for hypoxia, SOB, in setting of COPD exacerbation with increased sputum production.     Carlisle Cater, PA-C 11/06/13 1440

## 2013-11-06 NOTE — Progress Notes (Signed)
Called ED for report Dietrich Pates, [place]) at [phone]. Awaiting pt arrival to unit.

## 2013-11-06 NOTE — ED Notes (Signed)
Pt is here with coughing and states chest pain with coughing.  Pt states thinks he has bronchitis, coughing up green sputum.  Smoker

## 2013-11-06 NOTE — Progress Notes (Signed)
Pt admitted to the unit at 1545. Pt mental status is A & O x 4. Pt oriented to room, staff, and call bell. Skin is intact. Full assessment charted in [facility]. Call bell within reach. Visitor guidelines reviewed w/ pt and/or family.

## 2013-11-06 NOTE — ED Provider Notes (Signed)
Medical screening examination/treatment/procedure(s) were conducted as a shared visit with non-physician practitioner(s) and myself.  I personally evaluated the patient during the encounter.   EKG Interpretation   Date/Time:  Sunday Nov 06 2013 11:43:36 EDT Ventricular Rate:  88 PR Interval:  146 QRS Duration: 78 QT Interval:  354 QTC Calculation: 428 R Axis:   76 Text Interpretation:  Normal sinus rhythm Biatrial enlargement Nonspecific  ST abnormality Abnormal ECG nonspecific st changes as compared to prior  ecg Confirmed by Jurgen Groeneveld  MD, Lennette Bihari ([id number]) on 11/06/2013 12:26:08 PM      Patient will be admitted for COPD exacerbation.  Hoy Morn, MD 11/06/13 [phone]

## 2013-11-06 NOTE — H&P (Signed)
[facility] History and Physical  [name] DOB: Jul 19, 1945 DOA: 11/06/2013  Referring physician: Dr Venora Maples.  PCP: Pcp Not In System   Chief Complaint: SOB  HPI: [name] is a 68 y.o. male with PMH significant for hypertension, current smoker who presents with 3 weeks history of SOB, productive cough. He saw his PCP 4 days prior to admission he was prescribe albuterol, Z pack. He continue to have worsening SOB for that reason he presents to ED. He also relates some chest pain after coughing. He is feeling better after receiving nebulizer treatments, prednisone. In the ED he was found to be hypoxic at 87. Chest x ray with finding consistent with COPD. Patient does not have prior diagnosis of COPD.     Review of Systems:  Negative, except as per HPI.    Past Medical History  Diagnosis Date  . Hypertension    Past Surgical History  Procedure Laterality Date  . Back surgery     Social History:  reports that he has been smoking.  He does not have any smokeless tobacco history on file. He reports that he does not drink alcohol or use illicit drugs.  No Known Allergies  Family history; no history of COPD or lung disease in his family.   Prior to Admission medications   Medication Sig Start Date End Date Taking? Authorizing Provider  albuterol (PROVENTIL HFA;VENTOLIN HFA) 108 (90 BASE) MCG/ACT inhaler Inhale 1-2 puffs into the lungs every 6 (six) hours as needed for wheezing or shortness of breath.   Yes Historical Provider, MD  amLODipine (NORVASC) 5 MG tablet Take 5 mg by mouth daily.     Yes Historical Provider, MD  benzonatate (TESSALON) 100 MG capsule Take 100 mg by mouth 3 (three) times daily as needed for cough.   Yes Historical Provider, MD  HYDROcodone-acetaminophen (NORCO/VICODIN) 5-325 MG per tablet Take 1 tablet by mouth daily.   Yes Historical Provider, MD  valsartan (DIOVAN) 160 MG tablet Take 160 mg by mouth daily.     Yes Historical  Provider, MD   Physical Exam: Filed Vitals:   11/06/13 1321  BP: 113/64  Pulse: 83  Temp:   Resp: 16    BP 113/64  Pulse 83  Temp(Src) 98.1 F (36.7 C) (Oral)  Resp 16  SpO2 87%  General:  Appears calm and comfortable Eyes: PERRL, normal lids, irises & conjunctiva ENT: grossly normal hearing, lips & tongue Neck: no LAD, masses or thyromegaly Cardiovascular: RRR, no m/r/g. No LE edema. Telemetry: SR, no arrhythmias  Respiratory: no crackles or wheezing, Normal respiratory effort. Abdomen: soft, ntnd Skin: no rash or induration seen on limited exam Musculoskeletal: grossly normal tone BUE/BLE Psychiatric: grossly normal mood and affect, speech fluent and appropriate Neurologic: grossly non-focal.          Labs on Admission:  Basic Metabolic Panel:  Recent Labs Lab 11/06/13 1220  NA 133*  K 4.2  CL 95*  CO2 24  GLUCOSE 107*  BUN 11  CREATININE 0.95  CALCIUM 9.8   Liver Function Tests: No results found for this basename: AST, ALT, ALKPHOS, BILITOT, PROT, ALBUMIN,  in the last 168 hours No results found for this basename: LIPASE, AMYLASE,  in the last 168 hours No results found for this basename: AMMONIA,  in the last 168 hours CBC:  Recent Labs Lab 11/06/13 1220  WBC 16.6*  NEUTROABS 12.5*  HGB 14.4  HCT 41.5  MCV 89.6  PLT 497*  Cardiac Enzymes: No results found for this basename: CKTOTAL, CKMB, CKMBINDEX, TROPONINI,  in the last 168 hours  BNP (last 3 results) No results found for this basename: PROBNP,  in the last 8760 hours CBG: No results found for this basename: GLUCAP,  in the last 168 hours  Radiological Exams on Admission: Dg Chest 2 View  11/06/2013   CLINICAL DATA:  Cough and shortness of breath for 3 weeks  EXAM: CHEST  2 VIEW  COMPARISON:  11/02/2013  FINDINGS: The heart size and vascular pattern are normal. There is hyperinflation consistent with COPD. There is mild to moderate bilateral bronchitic change. There is no consolidation  or effusion. Interstitial changes in the perihilar areas are progressive when compared to 2008.  IMPRESSION: No evidence of bacterial pneumonia. There is COPD with mild to moderate bilateral bronchitic change.   Electronically Signed   By: Skipper Cliche M.D.   On: 11/06/2013 13:48    EKG: Independently reviewed. Sinus rhythm, non specific T wave abnormalities.   Assessment/Plan Active Problems:   HYPERTENSION   COPD exacerbation   Leukocytosis   1-Acute COPD exacerbation; patient presents with SOB, wheezing on lung exam perform by ED, chest x ray negative for PNA. I will continue with albuterol, ipratropium, prednisone and Levaquin. Will need to repeat oxygen on ambulation tomorrow. Will also check BNP.   2-Hypertension; SBP in the 120 range. Will hold BP medications for now. Assess need to resume medications in am.   3-Hyponatremia; mild. IV fluids.   4-Chest pain; after coughing but due to age and risk factors will cycle cardiac enzymes.   5-Leukocytosis; chest x ray negative for PNA. On Levaquin for COPD exacerbation. Check ua.   Code Status: Presume full code.  Family Communication: care discussed with multiple family member at bedside.  Disposition Plan: admit for treatment COPS exacerbation, and hypoxemia. Hospital stay depending on symptoms improvement.   Time spent: 75 minutes.   [facility] Pager [phone]  Disclaimer: This note may have been dictated with voice recognition software. Similar sounding words can inadvertently be transcribed and this note may contain transcription errors which may not have been corrected upon publication of note

## 2013-11-07 DIAGNOSIS — J441 Chronic obstructive pulmonary disease with (acute) exacerbation: Secondary | ICD-10-CM

## 2013-11-07 DIAGNOSIS — D72829 Elevated white blood cell count, unspecified: Secondary | ICD-10-CM

## 2013-11-07 DIAGNOSIS — I10 Essential (primary) hypertension: Secondary | ICD-10-CM

## 2013-11-07 LAB — URINE MICROSCOPIC-ADD ON

## 2013-11-07 LAB — BASIC METABOLIC PANEL
BUN: 13 mg/dL (ref 6–23)
CALCIUM: 9.7 mg/dL (ref 8.4–10.5)
CHLORIDE: 98 meq/L (ref 96–112)
CO2: 24 meq/L (ref 19–32)
Creatinine, Ser: 0.96 mg/dL (ref 0.50–1.35)
GFR calc Af Amer: >90 mL/min (ref >90)
GFR calc non Af Amer: 83 mL/min — ABNORMAL LOW (ref >90)
GLUCOSE: 138 mg/dL — AB (ref 70–99)
Potassium: 4.3 mEq/L (ref 3.7–5.3)
Sodium: 135 mEq/L — ABNORMAL LOW (ref 137–147)

## 2013-11-07 LAB — CBC
HEMATOCRIT: 39.1 % (ref 39.0–52.0)
Hemoglobin: 13 g/dL (ref 13.0–17.0)
MCH: 29.7 pg (ref 26.0–34.0)
MCHC: 33.2 g/dL (ref 30.0–36.0)
MCV: 89.3 fL (ref 78.0–100.0)
PLATELETS: 491 10*3/uL — AB (ref 150–400)
RBC: 4.38 MIL/uL (ref 4.22–5.81)
RDW: 15.1 % (ref 11.5–15.5)
WBC: 14 10*3/uL — AB (ref 4.0–10.5)

## 2013-11-07 LAB — URINALYSIS, ROUTINE W REFLEX MICROSCOPIC
Bilirubin Urine: NEGATIVE
HGB URINE DIPSTICK: NEGATIVE
Ketones, ur: NEGATIVE mg/dL
LEUKOCYTES UA: NEGATIVE
Nitrite: NEGATIVE
PH: 5.5 (ref 5.0–8.0)
PROTEIN: NEGATIVE mg/dL
Specific Gravity, Urine: 1.023 (ref 1.005–1.030)
Urobilinogen, UA: 0.2 mg/dL (ref 0.0–1.0)

## 2013-11-07 LAB — EXPECTORATED SPUTUM ASSESSMENT W REFEX TO RESP CULTURE

## 2013-11-07 LAB — TROPONIN I

## 2013-11-07 LAB — EXPECTORATED SPUTUM ASSESSMENT W GRAM STAIN, RFLX TO RESP C

## 2013-11-07 MED ORDER — MOMETASONE FURO-FORMOTEROL FUM 100-5 MCG/ACT IN AERO: 2.0000 | INHALATION_SPRAY | Freq: Two times a day (BID) | RESPIRATORY_TRACT | Status: DC

## 2013-11-07 MED ORDER — MOMETASONE FURO-FORMOTEROL FUM 100-5 MCG/ACT IN AERO
2.0000 | INHALATION_SPRAY | Freq: Two times a day (BID) | RESPIRATORY_TRACT | Status: DC
  Filled 2013-11-07: qty 8.8

## 2013-11-07 MED ORDER — LEVOFLOXACIN 750 MG PO TABS: 750.0000 mg | ORAL_TABLET | Freq: Every day | ORAL | Status: DC

## 2013-11-07 MED ORDER — IPRATROPIUM-ALBUTEROL 0.5-2.5 (3) MG/3ML IN SOLN: 3.0000 mL | Freq: Four times a day (QID) | RESPIRATORY_TRACT | Status: DC

## 2013-11-07 MED ORDER — PREDNISONE 10 MG PO TABS
60.0000 mg | ORAL_TABLET | Freq: Every day | ORAL | Status: DC
Start: 2013-11-07 — End: 2013-11-21

## 2013-11-07 MED ORDER — IPRATROPIUM-ALBUTEROL 0.5-2.5 (3) MG/3ML IN SOLN
3.0000 mL | Freq: Four times a day (QID) | RESPIRATORY_TRACT | Status: DC
  Administered 2013-11-07: 3 mL via RESPIRATORY_TRACT
  Filled 2013-11-07: qty 3

## 2013-11-07 MED ORDER — ALBUTEROL SULFATE (2.5 MG/3ML) 0.083% IN NEBU: 2.5000 mg | INHALATION_SOLUTION | RESPIRATORY_TRACT | Status: DC | PRN

## 2013-11-07 MED ORDER — DEXTROMETHORPHAN POLISTIREX 30 MG/5ML PO LQCR
30.0000 mg | Freq: Two times a day (BID) | ORAL | Status: DC | PRN
  Administered 2013-11-07: 30 mg via ORAL
  Filled 2013-11-07 (×2): qty 5

## 2013-11-07 NOTE — Care Management Note (Signed)
    Page 1 of 1   11/07/2013     2:14:13 PM CARE MANAGEMENT NOTE 11/07/2013  Patient:  RICHEY, DOOLITTLE   Account Number:  [id number]  Date Initiated:  11/07/2013  Documentation initiated by:  Tomi Bamberger  Subjective/Objective Assessment:   dx copd ex, hx bladder ca  admit- lives with spouse.  pta indep.     Action/Plan:   Anticipated DC Date:  11/07/2013   Anticipated DC Plan:  [place]  CM consult      Choice offered to / List presented to:          [facility] arranged  [place] - 11 Patient Refused      Status of service:  Completed, signed off Medicare Important Message given?  NA - LOS <3 / Initial given by admissions (If response is "NO", the following Medicare IM given date fields will be blank) Date Medicare IM given:   Date Additional Medicare IM given:    Discharge Disposition:  HOME/SELF CARE  Per UR Regulation:  Reviewed for med. necessity/level of care/duration of stay  If discussed at [place] of Stay Meetings, dates discussed:    Comments:  11/07/13 [place], BSN [phone] patient lives with spouse, pta indep.  NCM received referrral for resp care, patient refused stating he does not need home health.

## 2013-11-07 NOTE — Progress Notes (Signed)
[name] to be D/C'd Home per MD order.  Discussed with the patient and all questions fully answered.    Medication List         albuterol 108 (90 BASE) MCG/ACT inhaler  Commonly known as:  PROVENTIL HFA;VENTOLIN HFA  Inhale 1-2 puffs into the lungs every 6 (six) hours as needed for wheezing or shortness of breath.     albuterol (2.5 MG/3ML) 0.083% nebulizer solution  Commonly known as:  PROVENTIL  Take 3 mLs (2.5 mg total) by nebulization every 2 (two) hours as needed for wheezing.     amLODipine 5 MG tablet  Commonly known as:  NORVASC  Take 5 mg by mouth daily.     benzonatate 100 MG capsule  Commonly known as:  TESSALON  Take 100 mg by mouth 3 (three) times daily as needed for cough.     HYDROcodone-acetaminophen 5-325 MG per tablet  Commonly known as:  NORCO/VICODIN  Take 1 tablet by mouth daily.     ipratropium-albuterol 0.5-2.5 (3) MG/3ML Soln  Commonly known as:  DUONEB  Take 3 mLs by nebulization every 6 (six) hours.     levofloxacin 750 MG tablet  Commonly known as:  LEVAQUIN  Take 1 tablet (750 mg total) by mouth daily.     mometasone-formoterol 100-5 MCG/ACT Aero  Commonly known as:  DULERA  Inhale 2 puffs into the lungs 2 (two) times daily.     predniSONE 10 MG tablet  Commonly known as:  DELTASONE  Take 6 tablets (60 mg total) by mouth daily with breakfast. Take 5 tabs with breakfast on 6/2 then decrease by one tab each morning.  Ex.  Take 4 tabs on 6/3, 3 tabs on 6/4 etc...     valsartan 160 MG tablet  Commonly known as:  DIOVAN  Take 160 mg by mouth daily.        VVS, Skin clean, dry and intact without evidence of skin break down, no evidence of skin tears noted. IV catheter discontinued intact. Site without signs and symptoms of complications. Dressing and pressure applied.  An After Visit Summary was printed and given to the patient. Patient escorted via [place], and D/C home via private auto.  Olene Floss 11/07/2013 12:50 PM

## 2013-11-07 NOTE — Discharge Summary (Signed)
Physician Discharge Summary  [name] NFA:[id number] DOB: 08/12/1945 DOA: 11/06/2013  PCP: Pcp Not In System  Admit date: 11/06/2013 Discharge date: 11/07/2013  Time spent: 50 minutes  Recommendations for Outpatient Follow-up:  1. Stop Smoking 2. Follow up cbc / bmet. 3. Hyperglycemia - please eval for DM once he is off of prednisone.  Discharge Diagnoses:  Active Problems:   HYPERTENSION   COPD exacerbation   Leukocytosis   Discharge Condition: still with some shortness of breath, but improved.  Diet recommendation: regular diet.  Filed Weights   11/06/13 1621 11/07/13 0528  Weight: 60.328 kg (133 lb) 61.871 kg (136 lb 6.4 oz)    History of present illness:  [name] is a 68 y.o. male with PMH significant for hypertension, current smoker who presents with 3 weeks history of SOB, productive cough. He saw his PCP 4 days prior to admission he was prescribed albuterol, Z pack. He continued to have worsening SOB for that reason he presented to ED. He also relates some chest pain after coughing. He is feeling better after receiving nebulizer treatments, prednisone. In the ED he was found to be hypoxic at 87. Chest x ray with finding consistent with COPD. Patient does not have prior diagnosis of COPD.    Hospital Course:  1-Acute COPD exacerbation; Patient presented with SOB,  chest x ray negative for PNA. Placed on albuterol, ipratropium, prednisone and Levaquin.  Improved overnight.  Desires discharge to home. No longer requiring oxygen. Will discharge with a total antibiotic course of 7 days, duonebs, advair. Patient commits to stop smoking.  Refuses nicotine patch  2-Hypertension;  BP elevated.  Will restart home BP medications on discharge.  3-Hyponatremia;  mild. Improved with IV fluids. Follow up BMET at PCP follow up visit.  4-Chest pain; After coughing.  Troponins negative.  BNP normal.  5-Leukocytosis; Due to steroids.  chest x ray negative for PNA.  Urinalysis negative for infection.Marland Kitchen    Discharge Exam: Filed Vitals:   11/07/13 0842  BP: 134/71  Pulse: 87  Temp: 97.6 F (36.4 C)  Resp:     General: wd, thin, elder male, sitting up in bed, nad.  Wife at bedside. Cardiovascular: rrr no m/r/g, no chest pain with deep inspiration or palpation. Respiratory: CTA, no w/c/r.  No increased work of breathing. Abdomin:  Thin, Soft, nt, nd, +bs, no masses. Extremities:  No swelling, cyanosis.  Able to ambulate.  Discharge Instructions      Discharge Instructions   Diet - low sodium heart healthy    Complete by:  As directed      Increase activity slowly    Complete by:  As directed             Medication List         albuterol 108 (90 BASE) MCG/ACT inhaler  Commonly known as:  PROVENTIL HFA;VENTOLIN HFA  Inhale 1-2 puffs into the lungs every 6 (six) hours as needed for wheezing or shortness of breath.     albuterol (2.5 MG/3ML) 0.083% nebulizer solution  Commonly known as:  PROVENTIL  Take 3 mLs (2.5 mg total) by nebulization every 2 (two) hours as needed for wheezing.     amLODipine 5 MG tablet  Commonly known as:  NORVASC  Take 5 mg by mouth daily.     benzonatate 100 MG capsule  Commonly known as:  TESSALON  Take 100 mg by mouth 3 (three) times daily as needed for cough.  HYDROcodone-acetaminophen 5-325 MG per tablet  Commonly known as:  NORCO/VICODIN  Take 1 tablet by mouth daily.     ipratropium-albuterol 0.5-2.5 (3) MG/3ML Soln  Commonly known as:  DUONEB  Take 3 mLs by nebulization every 6 (six) hours.     levofloxacin 750 MG tablet  Commonly known as:  LEVAQUIN  Take 1 tablet (750 mg total) by mouth daily.     mometasone-formoterol 100-5 MCG/ACT Aero  Commonly known as:  DULERA  Inhale 2 puffs into the lungs 2 (two) times daily.     predniSONE 10 MG tablet  Commonly known as:  DELTASONE  Take 6 tablets (60 mg total) by mouth daily with breakfast. Take 5 tabs with breakfast on 6/2 then decrease  by one tab each morning.  Ex.  Take 4 tabs on 6/3, 3 tabs on 6/4 etc...     valsartan 160 MG tablet  Commonly known as:  DIOVAN  Take 160 mg by mouth daily.       No Known Allergies Follow-up Information   Follow up with Pcp Not In System.      Please follow up. (Follow up with the doctor in 1 - 2 weeks to have breathing checked.  Follow up sooner if your breathing becomes worse.)        The results of significant diagnostics from this hospitalization (including imaging, microbiology, ancillary and laboratory) are listed below for reference.    Significant Diagnostic Studies: Dg Chest 2 View  11/06/2013   CLINICAL DATA:  Cough and shortness of breath for 3 weeks  EXAM: CHEST  2 VIEW  COMPARISON:  11/02/2013  FINDINGS: The heart size and vascular pattern are normal. There is hyperinflation consistent with COPD. There is mild to moderate bilateral bronchitic change. There is no consolidation or effusion. Interstitial changes in the perihilar areas are progressive when compared to 2008.  IMPRESSION: No evidence of bacterial pneumonia. There is COPD with mild to moderate bilateral bronchitic change.   Electronically Signed   By: Skipper Cliche M.D.   On: 11/06/2013 13:48   Dg Chest 2 View  11/02/2013   CLINICAL DATA:  68 year old male with chest pain. Acute bronchitis. Initial encounter.  EXAM: CHEST  2 VIEW  COMPARISON:  11/26/2006.  FINDINGS: Stable somewhat large lung volumes. Normal cardiac size and mediastinal contours. Visualized tracheal air column is within normal limits. Chronic increased interstitial markings have mildly progressed. No pneumothorax, pulmonary edema, pleural effusion or confluent pulmonary opacity. No acute osseous abnormality identified.  IMPRESSION: No acute cardiopulmonary abnormality.   Electronically Signed   By: Lars Pinks M.D.   On: 11/02/2013 13:58    Microbiology: Recent Results (from the past 240 hour(s))  CULTURE, EXPECTORATED SPUTUM-ASSESSMENT      Status: None   Collection Time    11/07/13  3:46 AM      Result Value Ref Range Status   Specimen Description SPUTUM   Final   Special Requests NONE   Final   Sputum evaluation     Final   Value: THIS SPECIMEN IS ACCEPTABLE. RESPIRATORY CULTURE REPORT TO FOLLOW.   Report Status 11/07/2013 FINAL   Final     Labs: Basic Metabolic Panel:  Recent Labs Lab 11/06/13 1220 11/07/13 0630  NA 133* 135*  K 4.2 4.3  CL 95* 98  CO2 24 24  GLUCOSE 107* 138*  BUN 11 13  CREATININE 0.95 0.96  CALCIUM 9.8 9.7   CBC:  Recent Labs Lab 11/06/13 1220 11/07/13 0630  WBC 16.6* 14.0*  NEUTROABS 12.5*  --   HGB 14.4 13.0  HCT 41.5 39.1  MCV 89.6 89.3  PLT 497* 491*   Cardiac Enzymes:  Recent Labs Lab 11/06/13 1610 11/06/13 2057 11/07/13 0630  TROPONINI <0.30 <0.30 <0.30   BNP: BNP (last 3 results)  Recent Labs  11/06/13 1610  PROBNP 133.0*      Signed:  Karen Kitchens [phone]  [facility] 11/07/2013, 11:05 AM

## 2013-11-07 NOTE — Progress Notes (Signed)
Patient states he does not want [place] services, he is refusing [place] services.

## 2013-11-07 NOTE — Discharge Instructions (Signed)
STOP SMOKING!!!!!! 

## 2013-11-07 NOTE — Progress Notes (Signed)
Utilization review completed.  

## 2013-11-09 LAB — CULTURE, RESPIRATORY: Culture: NORMAL

## 2013-11-15 ENCOUNTER — Ambulatory Visit (HOSPITAL_COMMUNITY)
Admission: RE | Admit: 2013-11-15 | Discharge: 2013-11-15 | Disposition: A | Discharge status: Home / Self Care | Payer: Medicare Other | Source: Ambulatory Visit | Attending: Internal Medicine | Admitting: Internal Medicine

## 2013-11-15 ENCOUNTER — Other Ambulatory Visit (HOSPITAL_COMMUNITY): Payer: Self-pay | Admitting: Internal Medicine

## 2013-11-15 DIAGNOSIS — J40 Bronchitis, not specified as acute or chronic: Secondary | ICD-10-CM | POA: Insufficient documentation

## 2013-11-15 DIAGNOSIS — J209 Acute bronchitis, unspecified: Secondary | ICD-10-CM

## 2013-11-15 DIAGNOSIS — R0602 Shortness of breath: Secondary | ICD-10-CM | POA: Insufficient documentation

## 2013-11-21 ENCOUNTER — Ambulatory Visit (INDEPENDENT_AMBULATORY_CARE_PROVIDER_SITE_OTHER): Payer: Medicare Other | Admitting: Pulmonary Disease

## 2013-11-21 ENCOUNTER — Encounter: Payer: Self-pay | Admitting: Pulmonary Disease

## 2013-11-21 VITALS — BP 134/88 | HR 92 | Temp 97.0°F | Ht 66.0 in | Wt 139.2 lb

## 2013-11-21 DIAGNOSIS — J441 Chronic obstructive pulmonary disease with (acute) exacerbation: Secondary | ICD-10-CM

## 2013-11-21 DIAGNOSIS — J449 Chronic obstructive pulmonary disease, unspecified: Secondary | ICD-10-CM

## 2013-11-21 MED ORDER — ALBUTEROL SULFATE HFA 108 (90 BASE) MCG/ACT IN AERS: 2.0000 | INHALATION_SPRAY | Freq: Four times a day (QID) | RESPIRATORY_TRACT | Status: DC | PRN

## 2013-11-21 MED ORDER — BUDESONIDE-FORMOTEROL FUMARATE 160-4.5 MCG/ACT IN AERO: 2.0000 | INHALATION_SPRAY | Freq: Two times a day (BID) | RESPIRATORY_TRACT | Status: DC

## 2013-11-21 NOTE — Patient Instructions (Signed)
You have smoking related lung disease - COPD Lung capacity is at 43% You have to QUIT smoking STOP DUlera  Stay on symbicort 160 2 puffs twice daily Sample of spiriva daily -c all [place] for Rx if this works Albuterol Rx sent to pharmacy - to use 2 puffs upto 4 times daily as needed for RESCUE OK to use nebs only in emergency Pulmonary rehab referral

## 2013-11-21 NOTE — Progress Notes (Signed)
   Subjective:    Patient ID: [name], male    DOB: 10-Jun-1945, 68 y.o.   MRN: [id number]  HPI PCP - Warren Danes  68 y.o.Smoker Presents for evaluation of COPD. He was adm 5/31- 11/07/13 with  3 weeks history of SOB, productive cough. He saw his PCP 4 days prior to admission he was prescribed albuterol, Z pack. He continued to have worsening SOB , improved withnebulizer treatments, prednisone.He was found to be hypoxic at 87. Chest x ray with finding consistent with COPD -new diagnosis for patient. No longer requiring oxygen.   CRX 5/31 & 6/9 reviewed -hyperinflation, no obvious infx CT chest from 10/2006 reviewed Spirometry showed FEV1 43% - 1.31 - with ratio 53 Post bronchodilator, FEV1 improved to 1.56 -significant bronchodilator response. He did not desaturate on walking. He smoked a pack a day since the age of 15-about 40 pack years.he reports occasional wheezing, denies pedal edema or palpitations. He also reports occasional substernal chest pain which is nonexertional and not related to coughing.  Past Medical History  Diagnosis Date  . Hypertension   . Cancer     Bladder Ca 2013, surgically removed  . Anxiety   . Shortness of breath   . Arthritis     Past Surgical History  Procedure Laterality Date  . Back surgery    . Cancer removed     No Known Allergies  History   Social History  . Marital Status: Married    Spouse Name: N/A    Number of Children: N/A  . Years of Education: N/A   Occupational History  . Not on file.   Social History Main Topics  . Smoking status: Former Smoker -- 1.00 packs/day for 50 years    Types: Cigarettes    Quit date: 09/21/2013  . Smokeless tobacco: Not on file  . Alcohol Use: No  . Drug Use: No  . Sexual Activity: Yes    Birth Control/ Protection: None   Other Topics Concern  . Not on file   Social History Narrative  . No narrative on file    Family History  Problem Relation Age of Onset  . Cancer Brother   .  Cancer Father       Review of Systems  Constitutional: Negative for fever and unexpected weight change.  HENT: Positive for rhinorrhea. Negative for congestion, dental problem, ear pain, nosebleeds, postnasal drip, sinus pressure, sneezing, sore throat and trouble swallowing.   Eyes: Negative for redness and itching.  Respiratory: Positive for cough and shortness of breath. Negative for chest tightness and wheezing.   Cardiovascular: Negative for palpitations and leg swelling.  Gastrointestinal: Negative for nausea and vomiting.  Genitourinary: Negative for dysuria.  Musculoskeletal: Negative for joint swelling.  Skin: Negative for rash.  Neurological: Negative for headaches.  Hematological: Does not bruise/bleed easily.  Psychiatric/Behavioral: Negative for dysphoric mood. The patient is not nervous/anxious.        Objective:   Physical Exam  Gen. Pleasant, thin male, in no distress, normal affect ENT - no lesions, no post nasal drip Neck: No JVD, no thyromegaly, no carotid bruits Lungs: no use of accessory muscles, no dullness to percussion, decreased without rales or rhonchi  Cardiovascular: Rhythm regular, heart sounds  normal, no murmurs or gallops, no peripheral edema Abdomen: soft and non-tender, no hepatosplenomegaly, BS normal. Musculoskeletal: No deformities, no cyanosis or clubbing Neuro:  alert, non focal        Assessment & Plan:

## 2013-11-21 NOTE — Assessment & Plan Note (Addendum)
You have smoking related lung disease - COPD Lung capacity is at 43% You have to QUIT smoking STOP DUlera  Stay on symbicort 160 2 puffs twice daily Sample of spiriva daily -c all [place] for Rx if this works Albuterol Rx sent to pharmacy - to use 2 puffs upto 4 times daily as needed for RESCUE OK to use nebs only in emergency Pulmonary rehab referral If chest pain persists he may need cardiac evaluation in the future

## 2013-11-21 NOTE — Discharge Summary (Signed)
Discharge summary reviewed. Agree with assessment and plan outlined.

## 2013-12-05 ENCOUNTER — Telehealth: Payer: Self-pay | Admitting: Pulmonary Disease

## 2013-12-05 NOTE — Telephone Encounter (Signed)
lmomtcb for [employer]

## 2013-12-06 NOTE — Telephone Encounter (Signed)
Return call.Stanley A Dalton °

## 2013-12-06 NOTE — Telephone Encounter (Signed)
lmtcb x1 for [name]. Pt has 2 spiro orders in epic 1 of the orders is pre and other is post

## 2013-12-06 NOTE — Telephone Encounter (Signed)
Second spirometry is a post BD effort

## 2013-12-06 NOTE — Telephone Encounter (Signed)
Pt had spirometry done at 6.15.15 [facility] and spoke with Cloyde Reams who reported that referring for COPD pt must have a FULL PFT w/ post bronchodilator.  Any other diagnosis (ie chronic bronchitis or emphysema are covered by Medicare) will not need a PFT.  Per Cloyde Reams, no call back is needed - she will look out for the order and she will call the patient.  Dr Elsworth Soho please advise, thank you.

## 2013-12-07 NOTE — Telephone Encounter (Signed)
Pulmonary rehab staff does not work on Wednesdays  Called spoke with Thayer Headings and left detailed message about the 2 spirometries.  Thayer Headings will relay to [place]. Nothing further needed at this time; will sign off.

## 2013-12-08 ENCOUNTER — Telehealth (HOSPITAL_COMMUNITY): Payer: Self-pay

## 2013-12-08 NOTE — Telephone Encounter (Signed)
Spoke with patient regarding entrance into Pulmonary Rehab.  Patient states that he doesn't think he needs it but he will think about it. I will follow up with the patient.

## 2013-12-13 ENCOUNTER — Institutional Professional Consult (permissible substitution): Payer: Medicare Other | Admitting: Pulmonary Disease

## 2014-01-02 ENCOUNTER — Ambulatory Visit (INDEPENDENT_AMBULATORY_CARE_PROVIDER_SITE_OTHER): Payer: Medicare Other | Admitting: Pulmonary Disease

## 2014-01-02 ENCOUNTER — Encounter: Payer: Self-pay | Admitting: Pulmonary Disease

## 2014-01-02 VITALS — BP 130/80 | HR 84 | Ht 66.0 in | Wt 149.2 lb

## 2014-01-02 DIAGNOSIS — J441 Chronic obstructive pulmonary disease with (acute) exacerbation: Secondary | ICD-10-CM

## 2014-01-02 MED ORDER — TIOTROPIUM BROMIDE MONOHYDRATE 18 MCG IN CAPS: 18.0000 ug | ORAL_CAPSULE | Freq: Every day | RESPIRATORY_TRACT | Status: DC

## 2014-01-02 NOTE — Progress Notes (Signed)
   Subjective:    Patient ID: [name], male    DOB: 1946-05-19, 68 y.o.   MRN: [id number]  HPI  PCP - Warren Danes  68 y.o.Smoker Presents for FU of COPD.  He was adm 5/31- 11/07/13 with COPD flare. No longer requiring oxygen.  CRX 5/31 & 6/9 reviewed -hyperinflation, no obvious infx  CT chest from 10/2006 reviewed  Spirometry showed FEV1 43% - 1.31 - with ratio 53  Post bronchodilator, FEV1 improved to 1.56 -significant bronchodilator response.  He smoked a pack a day since the age of 15-about 40 pack years  01/02/2014  Chief Complaint  Patient presents with  . Follow-up    Pt reports breathing is better than prior visit. C/o occas prod cough-green phlem. Pt denies any wheezing/chest tx.   CP is improved Breathing much better Did not start rehab program - c/o back hurting , bulging discs - waiting on surgical referral He quit smoking 09/2013   Review of Systems neg for any significant sore throat, dysphagia, itching, sneezing, nasal congestion or excess/ purulent secretions, fever, chills, sweats, unintended wt loss, pleuritic or exertional cp, hempoptysis, orthopnea pnd or change in chronic leg swelling. Also denies presyncope, palpitations, heartburn, abdominal pain, nausea, vomiting, diarrhea or change in bowel or urinary habits, dysuria,hematuria, rash, arthralgias, visual complaints, headache, numbness weakness or ataxia.     Objective:   Physical Exam  Gen. Pleasant, thin man, in no distress ENT - no lesions, no post nasal drip Neck: No JVD, no thyromegaly, no carotid bruits Lungs: no use of accessory muscles, no dullness to percussion, decreased without rales or rhonchi  Cardiovascular: Rhythm regular, heart sounds  normal, no murmurs or gallops, no peripheral edema Musculoskeletal: No deformities, no cyanosis or clubbing        Assessment & Plan:

## 2014-01-02 NOTE — Assessment & Plan Note (Signed)
Rx for spiriva will be sent OK to stop taking prednisone Can enroll in pulmonary rehab program once your back is better Ct symbicort bid

## 2014-01-02 NOTE — Patient Instructions (Signed)
Rx for spiriva will be sent OK to stop taking prednisone Can enroll in pulmonary rehab program once your back is better

## 2014-02-07 ENCOUNTER — Ambulatory Visit (INDEPENDENT_AMBULATORY_CARE_PROVIDER_SITE_OTHER): Payer: Medicare Other | Admitting: General Surgery

## 2014-03-06 ENCOUNTER — Telehealth (HOSPITAL_COMMUNITY): Payer: Self-pay

## 2014-03-06 NOTE — Telephone Encounter (Signed)
Spoke with patient regarding entrance into Pulmonary Rehab.  Patient states his back is still bothering him but he states he is interested in the program.  Patient was told he will have a large co-pay with his insurance.  Patient states he wants to think about the cost and he will call me back.

## 2014-03-17 ENCOUNTER — Encounter: Payer: Self-pay | Admitting: Adult Health

## 2014-03-17 ENCOUNTER — Ambulatory Visit (INDEPENDENT_AMBULATORY_CARE_PROVIDER_SITE_OTHER): Payer: Medicare Other | Admitting: Adult Health

## 2014-03-17 VITALS — BP 140/80 | HR 93 | Temp 98.4°F | Ht 66.0 in | Wt 153.2 lb

## 2014-03-17 DIAGNOSIS — J449 Chronic obstructive pulmonary disease, unspecified: Secondary | ICD-10-CM

## 2014-03-17 NOTE — Progress Notes (Signed)
   Subjective:    Patient ID: [name], male    DOB: [birth year], 68 y.o.   MRN: [id number]  HPI   PCP - Warren Danes  68 y.o.Smoker Presents for FU of COPD.  He was adm 5/31- 11/07/13 with COPD flare. No longer requiring oxygen.  CRX 5/31 & 6/9 reviewed -hyperinflation, no obvious infx  CT chest from 10/2006 reviewed  Spirometry showed FEV1 43% - 1.31 - with ratio 53  Post bronchodilator, FEV1 improved to 1.56 -significant bronchodilator response.  He smoked a pack a day since the age of 15-about 40 pack years  01/02/14  CP is improved Breathing much better Did not start rehab program - c/o back hurting , bulging discs - waiting on surgical referral He quit smoking 09/2013  03/17/2014 Followup COPD Patient returns for a, a three-month followup for COPD Patient remains on Symbicort, and Spiriva. Patient reports breathing is about the same as always . Has some daily dry cough that comes and goes.  Denies any increased SOB, wheezing, cough. Last chest x-ray was in June with chronic changes,  no acute process.  Declines flu and pneumonia shot .   Review of Systems  neg for any significant sore throat, dysphagia, itching, sneezing, nasal congestion or excess/ purulent secretions, fever, chills, sweats, unintended wt loss, pleuritic or exertional cp, hempoptysis, orthopnea pnd or change in chronic leg swelling. Also denies presyncope, palpitations, heartburn, abdominal pain, nausea, vomiting, diarrhea or change in bowel or urinary habits, dysuria,hematuria, rash, arthralgias, visual complaints, headache, numbness weakness or ataxia.     Objective:   Physical Exam   Gen. Pleasant, thin man, in no distress ENT - no lesions, no post nasal drip Neck: No JVD, no thyromegaly, no carotid bruits Lungs: no use of accessory muscles, no dullness to percussion, decreased without rales or rhonchi  Cardiovascular: Rhythm regular, heart sounds  normal, no murmurs or gallops, no peripheral  edema Musculoskeletal: No deformities, no cyanosis or clubbing        Assessment & Plan:

## 2014-03-17 NOTE — Assessment & Plan Note (Addendum)
Compensated on current regimen without flare. Patient declines a flu and Pneumovax vaccine  Plan  Continue on Symbicort 2 puff twice daily, rinse after use. Continue on Spiriva once daily. Followup with Dr. Elsworth Soho in 6 months and as needed

## 2014-03-17 NOTE — Patient Instructions (Signed)
Continue on Symbicort 2 puff twice daily, rinse after use. Continue on Spiriva once daily. Followup with Dr. Elsworth Soho in 6 months and as needed

## 2014-03-20 NOTE — Progress Notes (Signed)
Reviewed & agree with plan  

## 2014-03-24 ENCOUNTER — Emergency Department (HOSPITAL_COMMUNITY)
Admission: EM | Admit: 2014-03-24 | Discharge: 2014-03-24 | Disposition: A | Discharge status: Home / Self Care | Payer: Medicare Other | Attending: Emergency Medicine | Admitting: Emergency Medicine

## 2014-03-24 ENCOUNTER — Emergency Department (HOSPITAL_COMMUNITY): Payer: Medicare Other

## 2014-03-24 ENCOUNTER — Encounter (HOSPITAL_COMMUNITY): Payer: Self-pay | Admitting: Emergency Medicine

## 2014-03-24 DIAGNOSIS — K219 Gastro-esophageal reflux disease without esophagitis: Secondary | ICD-10-CM | POA: Diagnosis not present

## 2014-03-24 DIAGNOSIS — Z8659 Personal history of other mental and behavioral disorders: Secondary | ICD-10-CM | POA: Diagnosis not present

## 2014-03-24 DIAGNOSIS — Z7951 Long term (current) use of inhaled steroids: Secondary | ICD-10-CM | POA: Insufficient documentation

## 2014-03-24 DIAGNOSIS — Z8551 Personal history of malignant neoplasm of bladder: Secondary | ICD-10-CM | POA: Diagnosis not present

## 2014-03-24 DIAGNOSIS — R079 Chest pain, unspecified: Secondary | ICD-10-CM | POA: Diagnosis not present

## 2014-03-24 DIAGNOSIS — I10 Essential (primary) hypertension: Secondary | ICD-10-CM | POA: Diagnosis not present

## 2014-03-24 DIAGNOSIS — M1990 Unspecified osteoarthritis, unspecified site: Secondary | ICD-10-CM | POA: Diagnosis not present

## 2014-03-24 DIAGNOSIS — Z87891 Personal history of nicotine dependence: Secondary | ICD-10-CM | POA: Insufficient documentation

## 2014-03-24 LAB — BASIC METABOLIC PANEL
Anion gap: 17 — ABNORMAL HIGH (ref 5–15)
BUN: 12 mg/dL (ref 6–23)
CO2: 21 mEq/L (ref 19–32)
CREATININE: 1.1 mg/dL (ref 0.50–1.35)
Calcium: 9.9 mg/dL (ref 8.4–10.5)
Chloride: 96 mEq/L (ref 96–112)
GFR calc non Af Amer: 67 mL/min — ABNORMAL LOW (ref >90)
GFR, EST AFRICAN AMERICAN: 77 mL/min — AB (ref >90)
GLUCOSE: 106 mg/dL — AB (ref 70–99)
Potassium: 4.1 mEq/L (ref 3.7–5.3)
Sodium: 134 mEq/L — ABNORMAL LOW (ref 137–147)

## 2014-03-24 LAB — CBC
HEMATOCRIT: 45.2 % (ref 39.0–52.0)
Hemoglobin: 15.7 g/dL (ref 13.0–17.0)
MCH: 31.2 pg (ref 26.0–34.0)
MCHC: 34.7 g/dL (ref 30.0–36.0)
MCV: 89.9 fL (ref 78.0–100.0)
Platelets: 399 10*3/uL (ref 150–400)
RBC: 5.03 MIL/uL (ref 4.22–5.81)
RDW: 15.3 % (ref 11.5–15.5)
WBC: 9.1 10*3/uL (ref 4.0–10.5)

## 2014-03-24 LAB — TROPONIN I: Troponin I: <0.3 ng/mL (ref <0.30)

## 2014-03-24 LAB — I-STAT TROPONIN, ED: Troponin i, poc: 0.02 ng/mL (ref 0.00–0.08)

## 2014-03-24 MED ORDER — MORPHINE SULFATE 4 MG/ML IJ SOLN: 4.0000 mg | Freq: Once | INTRAMUSCULAR | Status: DC

## 2014-03-24 MED ORDER — ASPIRIN 81 MG PO CHEW
324.0000 mg | CHEWABLE_TABLET | Freq: Once | ORAL | Status: AC
  Administered 2014-03-24: 324 mg via ORAL
  Filled 2014-03-24: qty 4

## 2014-03-24 MED ORDER — PANTOPRAZOLE SODIUM 20 MG PO TBEC: 20.0000 mg | DELAYED_RELEASE_TABLET | Freq: Every day | ORAL | Status: DC

## 2014-03-24 MED ORDER — SUCRALFATE 1 GM/10ML PO SUSP: 1.0000 g | Freq: Three times a day (TID) | ORAL | Status: DC

## 2014-03-24 MED ORDER — NITROGLYCERIN 0.4 MG SL SUBL
0.4000 mg | SUBLINGUAL_TABLET | SUBLINGUAL | Status: DC | PRN
  Administered 2014-03-24: 0.4 mg via SUBLINGUAL
  Filled 2014-03-24: qty 1

## 2014-03-24 MED ORDER — GI COCKTAIL ~~LOC~~
30.0000 mL | Freq: Once | ORAL | Status: AC
  Administered 2014-03-24: 30 mL via ORAL
  Filled 2014-03-24: qty 30

## 2014-03-24 NOTE — ED Notes (Signed)
Pt refused more nitroglycerin.

## 2014-03-24 NOTE — ED Notes (Signed)
The pt is c/o mod-chest pain for 3-4 hours.  Also has some indigestion

## 2014-03-24 NOTE — ED Provider Notes (Signed)
CSN: [id number]     Arrival date & time 03/24/14  1816 History   First MD Initiated Contact with Patient 03/24/14 2049     Chief Complaint  Patient presents with  . Chest Pain     (Consider location/radiation/quality/duration/timing/severity/associated sxs/prior Treatment) HPI Comments: Patient presents to the ER for evaluation of chest pain. Patient reports that he had been experiencing discomfort across his chest for the last 3 or 4 hours before coming to the ER. Patient reports that symptoms came on at rest. He had nausea initially, that has resolved. No shortness of breath. Patient reports that the discomfort has been constant. He reports a "drawing" and pressure exposed to his chest with a sharp component in the center. He has not identified any alleviating or exacerbating factors.  Patient is a 68 y.o. male presenting with chest pain.  Chest Pain Associated symptoms: nausea   Associated symptoms: no shortness of breath and not vomiting     Past Medical History  Diagnosis Date  . Hypertension   . Cancer     Bladder Ca 2013, surgically removed  . Anxiety   . Shortness of breath   . Arthritis    Past Surgical History  Procedure Laterality Date  . Back surgery    . Cancer removed     Family History  Problem Relation Age of Onset  . Cancer Brother   . Cancer Father    History  Substance Use Topics  . Smoking status: Former Smoker -- 1.00 packs/day for 50 years    Types: Cigarettes    Quit date: 09/21/2013  . Smokeless tobacco: Not on file  . Alcohol Use: No    Review of Systems  Respiratory: Negative for shortness of breath.   Cardiovascular: Positive for chest pain.  Gastrointestinal: Positive for nausea. Negative for vomiting.  All other systems reviewed and are negative.     Allergies  Review of patient's allergies indicates no known allergies.  Home Medications   Prior to Admission medications   Medication Sig Start Date End Date Taking? Authorizing  Provider  albuterol (PROVENTIL HFA;VENTOLIN HFA) 108 (90 BASE) MCG/ACT inhaler Inhale 2 puffs into the lungs every 6 (six) hours as needed for wheezing or shortness of breath. 11/21/13  Yes Rigoberto Noel, MD  amLODipine (NORVASC) 5 MG tablet Take 10 mg by mouth daily.    Yes Historical Provider, MD  budesonide-formoterol (SYMBICORT) 160-4.5 MCG/ACT inhaler Inhale 2 puffs into the lungs 2 (two) times daily. 11/21/13  Yes Rigoberto Noel, MD  HYDROcodone-acetaminophen (NORCO/VICODIN) 5-325 MG per tablet Take 1 tablet by mouth every 6 (six) hours as needed for moderate pain.    Yes Historical Provider, MD  ipratropium-albuterol (DUONEB) 0.5-2.5 (3) MG/3ML SOLN Take 3 mLs by nebulization every 6 (six) hours as needed. 11/07/13  Yes Bobby Rumpf York, PA-C  Sennosides-Docusate Sodium (STOOL SOFTENER LAXATIVE PO) Take 1 capsule by mouth daily as needed (for elimination).   Yes Historical Provider, MD  tiotropium (SPIRIVA) 18 MCG inhalation capsule Place 1 capsule (18 mcg total) into inhaler and inhale daily. 01/02/14  Yes Rigoberto Noel, MD  valsartan (DIOVAN) 160 MG tablet Take 160 mg by mouth daily.     Yes Historical Provider, MD  pantoprazole (PROTONIX) 20 MG tablet Take 1 tablet (20 mg total) by mouth daily. 03/24/14   Orpah Greek, MD  sucralfate (CARAFATE) 1 GM/10ML suspension Take 10 mLs (1 g total) by mouth 4 (four) times daily -  with meals and at  bedtime. 03/24/14   Orpah Greek, MD   BP 138/72  Pulse 85  Temp(Src) 98.4 F (36.9 C) (Oral)  Resp 11  Ht 5\' 6"  (1.676 m)  Wt 153 lb (69.4 kg)  BMI 24.71 kg/m2  SpO2 97% Physical Exam  Constitutional: He is oriented to person, place, and time. He appears well-developed and well-nourished. No distress.  HENT:  Head: Normocephalic and atraumatic.  Right Ear: Hearing normal.  Left Ear: Hearing normal.  Nose: Nose normal.  Mouth/Throat: Oropharynx is clear and moist and mucous membranes are normal.  Eyes: Conjunctivae and EOM are  normal. Pupils are equal, round, and reactive to light.  Neck: Normal range of motion. Neck supple.  Cardiovascular: Regular rhythm, S1 normal and S2 normal.  Exam reveals no gallop and no friction rub.   No murmur heard. Pulmonary/Chest: Effort normal and breath sounds normal. No respiratory distress. He exhibits no tenderness.  Abdominal: Soft. Normal appearance and bowel sounds are normal. There is no hepatosplenomegaly. There is no tenderness. There is no rebound, no guarding, no tenderness at McBurney's point and negative Murphy's sign. No hernia.  Musculoskeletal: Normal range of motion.  Neurological: He is alert and oriented to person, place, and time. He has normal strength. No cranial nerve deficit or sensory deficit. Coordination normal. GCS eye subscore is 4. GCS verbal subscore is 5. GCS motor subscore is 6.  Skin: Skin is warm, dry and intact. No rash noted. No cyanosis.  Psychiatric: He has a normal mood and affect. His speech is normal and behavior is normal. Thought content normal.    ED Course  Procedures (including critical care time) Labs Review Labs Reviewed  BASIC METABOLIC PANEL - Abnormal; Notable for the following:    Sodium 134 (*)    Glucose, Bld 106 (*)    GFR calc non Af Amer 67 (*)    GFR calc Af Amer 77 (*)    Anion gap 17 (*)    All other components within normal limits  CBC  TROPONIN I  I-STAT TROPOININ, ED    Imaging Review Dg Chest 2 View  03/24/2014   CLINICAL DATA:  Chest pain  EXAM: CHEST  2 VIEW  COMPARISON:  11/15/2013  FINDINGS: Normal heart size. No pleural effusion or edema. There is no airspace consolidation identified. Coarsened interstitial markings are identified bilaterally. There is slight asymmetric elevation of left hemidiaphragm.  IMPRESSION: No active cardiopulmonary disease.   Electronically Signed   By: Kerby Moors M.D.   On: 03/24/2014 20:37     EKG Interpretation None      Date: 03/24/2014  Rate: 100  Rhythm: normal  sinus rhythm  QRS Axis: normal  Intervals: normal  ST/T Wave abnormalities: normal  Conduction Disutrbances:none  Narrative Interpretation:   Old EKG Reviewed: none available    MDM   Final diagnoses:  Chest pain, unspecified chest pain type  Gastroesophageal reflux disease, esophagitis presence not specified    He presented to the ER for evaluation of chest pain. Pain was somewhat atypical in nature. Any sharp bone in the center of his chest with a squeezing pressure across the chest. This came on at rest it was not related to exertion. There was no shortness of breath. Patient reports feeling like it was indigestion. He had no relief with aspirin or nitroglycerin. He had complete resolution with GI cocktail. The patient was monitored for any length of time in the ER and a repeat EKG was negative. As patient's symptoms are  very atypical, likely GI in nature, he is appropriate for outpatient treatment with Protonix, Carafate, follow up with primary doctor. Return if symptoms worsen.    Orpah Greek, MD 03/24/14 [phone]

## 2014-03-24 NOTE — Discharge Instructions (Signed)

## 2014-04-10 NOTE — Progress Notes (Signed)
Reviewed & agree with plan  

## 2014-06-13 ENCOUNTER — Telehealth (HOSPITAL_COMMUNITY): Payer: Self-pay | Admitting: *Deleted

## 2014-07-03 ENCOUNTER — Telehealth (HOSPITAL_COMMUNITY): Payer: Self-pay

## 2014-07-03 NOTE — Telephone Encounter (Signed)
I have called and left a message with [name] to inquire about participation in Pulmonary Rehab. Will send letter in mail and follow up.

## 2014-07-03 NOTE — Telephone Encounter (Signed)
Patient returned phone call regarding entrance into Pulmonary Rehab.  Patient states he does not feel the need for it but would like some time to think about it.  Will follow up next week.

## 2014-07-10 ENCOUNTER — Telehealth (HOSPITAL_COMMUNITY): Payer: Self-pay | Admitting: *Deleted

## 2014-08-25 ENCOUNTER — Encounter: Payer: Self-pay | Admitting: Adult Health

## 2014-08-25 ENCOUNTER — Ambulatory Visit (INDEPENDENT_AMBULATORY_CARE_PROVIDER_SITE_OTHER): Payer: Medicare Other | Admitting: Adult Health

## 2014-08-25 VITALS — BP 126/76 | HR 94 | Temp 98.0°F | Ht 66.0 in | Wt 161.0 lb

## 2014-08-25 DIAGNOSIS — J441 Chronic obstructive pulmonary disease with (acute) exacerbation: Secondary | ICD-10-CM

## 2014-08-25 MED ORDER — PREDNISONE 10 MG PO TABS: ORAL_TABLET | ORAL | Status: DC

## 2014-08-25 MED ORDER — AMOXICILLIN-POT CLAVULANATE 875-125 MG PO TABS: 1.0000 | ORAL_TABLET | Freq: Two times a day (BID) | ORAL | Status: AC

## 2014-08-25 NOTE — Patient Instructions (Signed)
Augmentin 875mg  Twice daily  For 7 days -take with food.  Mucinex DM Twice daily  As needed   Prednisone taper over next week.  Continue on Spiriva 1 puff daily  Follow up Dr. Elsworth Soho  In 6-8 weeks and As needed   Please contact office for sooner follow up if symptoms do not improve or worsen or seek emergency care

## 2014-08-25 NOTE — Assessment & Plan Note (Signed)
Exacerbation  Would benefit from ICS/LABA combo but pt declines.    Plan  Augmentin 875mg  Twice daily  For 7 days -take with food.  Mucinex DM Twice daily  As needed   Prednisone taper over next week.  Continue on Spiriva 1 puff daily  Follow up Dr. Elsworth Soho  In 6-8 weeks and As needed   Please contact office for sooner follow up if symptoms do not improve or worsen or seek emergency care

## 2014-08-25 NOTE — Progress Notes (Signed)
   Subjective:    Patient ID: [name], male    DOB: 05-06-1946, 69 y.o.   MRN: [id number]  HPI  PCP - Avubere  69 o.Smoker Presents for FU of COPD.  He was adm 5/31- 11/07/13 with COPD flare. No longer requiring oxygen.  CRX 5/31 & 6/9 reviewed -hyperinflation, no obvious infx  CT chest from 10/2006 reviewed  Spirometry showed FEV1 43% - 1.31 - with ratio 53  Post bronchodilator, FEV1 improved to 1.56 -significant bronchodilator response.  He smoked a pack a day since the age of 15-about 31 pack years  01/02/14  CP is improved Breathing much better Did not start rehab program - c/o back hurting , bulging discs - waiting on surgical referral He quit smoking 09/2013   03/17/14  Followup COPD Patient returns for a, a three-month followup for COPD Patient remains on Symbicort, and Spiriva. Patient reports breathing is about the same as always . Has some daily dry cough that comes and goes.  Denies any increased SOB, wheezing, cough. Last chest x-ray was in June with chronic changes,  no acute process.  Declines flu and pneumonia shot .  >>no change s  08/25/2014 Acute OV  Complains of 1 week of productive cough with thick white mucus, wheezing , cough and tightness .  No otc used Remains on Spiriva . Stopped Symbicort -does not feel he needs this.  No chest pain, orthopnea, edema , hemoptysis, calf pain, or fever.  Last cxr 03/2014 w/ chronic changes. -nad.       Review of Systems neg for any significant sore throat, dysphagia, itching, sneezing, nasal congestion or excess/ purulent secretions, fever, chills, sweats, unintended wt loss, pleuritic or exertional cp, hempoptysis, orthopnea pnd or change in chronic leg swelling. Also denies presyncope, palpitations, heartburn, abdominal pain, nausea, vomiting, diarrhea or change in bowel or urinary habits, dysuria,hematuria, rash, arthralgias, visual complaints, headache, numbness weakness or ataxia.     Objective:   Physical  Exam  Gen. Pleasant, thin man, in no distress ENT - no lesions, no post nasal drip Neck: No JVD, no thyromegaly, no carotid bruits Lungs: no use of accessory muscles, no dullness to percussion, faint exp wheeze  Cardiovascular: Rhythm regular, heart sounds  normal, no murmurs or gallops, no peripheral edema Musculoskeletal: No deformities, no cyanosis or clubbing        Assessment & Plan:

## 2014-08-28 NOTE — Progress Notes (Signed)
Reviewed & agree with plan  

## 2014-09-18 DIAGNOSIS — J449 Chronic obstructive pulmonary disease, unspecified: Secondary | ICD-10-CM | POA: Diagnosis not present

## 2014-09-18 DIAGNOSIS — Z23 Encounter for immunization: Secondary | ICD-10-CM | POA: Diagnosis not present

## 2014-09-18 DIAGNOSIS — M5126 Other intervertebral disc displacement, lumbar region: Secondary | ICD-10-CM | POA: Diagnosis not present

## 2014-09-18 DIAGNOSIS — R7301 Impaired fasting glucose: Secondary | ICD-10-CM | POA: Diagnosis not present

## 2014-09-18 DIAGNOSIS — I10 Essential (primary) hypertension: Secondary | ICD-10-CM | POA: Diagnosis not present

## 2014-09-25 ENCOUNTER — Emergency Department (HOSPITAL_COMMUNITY): Payer: Medicare Other

## 2014-09-25 ENCOUNTER — Encounter (HOSPITAL_COMMUNITY): Payer: Self-pay | Admitting: Emergency Medicine

## 2014-09-25 ENCOUNTER — Emergency Department (HOSPITAL_COMMUNITY)
Admission: EM | Admit: 2014-09-25 | Discharge: 2014-09-25 | Disposition: A | Discharge status: Home / Self Care | Payer: Medicare Other | Attending: Emergency Medicine | Admitting: Emergency Medicine

## 2014-09-25 DIAGNOSIS — Z8551 Personal history of malignant neoplasm of bladder: Secondary | ICD-10-CM | POA: Diagnosis not present

## 2014-09-25 DIAGNOSIS — M1990 Unspecified osteoarthritis, unspecified site: Secondary | ICD-10-CM | POA: Insufficient documentation

## 2014-09-25 DIAGNOSIS — Z7951 Long term (current) use of inhaled steroids: Secondary | ICD-10-CM | POA: Insufficient documentation

## 2014-09-25 DIAGNOSIS — Z79899 Other long term (current) drug therapy: Secondary | ICD-10-CM | POA: Insufficient documentation

## 2014-09-25 DIAGNOSIS — Z87891 Personal history of nicotine dependence: Secondary | ICD-10-CM | POA: Diagnosis not present

## 2014-09-25 DIAGNOSIS — J209 Acute bronchitis, unspecified: Secondary | ICD-10-CM | POA: Diagnosis not present

## 2014-09-25 DIAGNOSIS — J40 Bronchitis, not specified as acute or chronic: Secondary | ICD-10-CM | POA: Diagnosis not present

## 2014-09-25 DIAGNOSIS — I10 Essential (primary) hypertension: Secondary | ICD-10-CM | POA: Insufficient documentation

## 2014-09-25 DIAGNOSIS — F419 Anxiety disorder, unspecified: Secondary | ICD-10-CM | POA: Diagnosis not present

## 2014-09-25 DIAGNOSIS — R0602 Shortness of breath: Secondary | ICD-10-CM | POA: Diagnosis not present

## 2014-09-25 LAB — I-STAT TROPONIN, ED: Troponin i, poc: 0 ng/mL (ref 0.00–0.08)

## 2014-09-25 LAB — BASIC METABOLIC PANEL
ANION GAP: 10 (ref 5–15)
BUN: 12 mg/dL (ref 6–23)
CHLORIDE: 103 mmol/L (ref 96–112)
CO2: 23 mmol/L (ref 19–32)
Calcium: 9.3 mg/dL (ref 8.4–10.5)
Creatinine, Ser: 1.3 mg/dL (ref 0.50–1.35)
GFR calc Af Amer: 63 mL/min — ABNORMAL LOW (ref >90)
GFR calc non Af Amer: 54 mL/min — ABNORMAL LOW (ref >90)
GLUCOSE: 97 mg/dL (ref 70–99)
Potassium: 4.1 mmol/L (ref 3.5–5.1)
SODIUM: 136 mmol/L (ref 135–145)

## 2014-09-25 LAB — CBC
HCT: 42.8 % (ref 39.0–52.0)
Hemoglobin: 13.6 g/dL (ref 13.0–17.0)
MCH: 28.3 pg (ref 26.0–34.0)
MCHC: 31.8 g/dL (ref 30.0–36.0)
MCV: 89.2 fL (ref 78.0–100.0)
PLATELETS: 407 10*3/uL — AB (ref 150–400)
RBC: 4.8 MIL/uL (ref 4.22–5.81)
RDW: 15.3 % (ref 11.5–15.5)
WBC: 9.4 10*3/uL (ref 4.0–10.5)

## 2014-09-25 LAB — BRAIN NATRIURETIC PEPTIDE: B Natriuretic Peptide: 55.1 pg/mL (ref 0.0–100.0)

## 2014-09-25 MED ORDER — IPRATROPIUM BROMIDE 0.02 % IN SOLN
0.5000 mg | Freq: Once | RESPIRATORY_TRACT | Status: AC
  Administered 2014-09-25: 0.5 mg via RESPIRATORY_TRACT
  Filled 2014-09-25: qty 2.5

## 2014-09-25 MED ORDER — ALBUTEROL SULFATE (2.5 MG/3ML) 0.083% IN NEBU: 5.0000 mg | INHALATION_SOLUTION | Freq: Once | RESPIRATORY_TRACT | Status: DC

## 2014-09-25 MED ORDER — PREDNISONE 10 MG PO TABS: ORAL_TABLET | ORAL | Status: DC

## 2014-09-25 MED ORDER — ALBUTEROL SULFATE (2.5 MG/3ML) 0.083% IN NEBU
5.0000 mg | INHALATION_SOLUTION | Freq: Once | RESPIRATORY_TRACT | Status: AC
  Administered 2014-09-25: 5 mg via RESPIRATORY_TRACT
  Filled 2014-09-25: qty 6

## 2014-09-25 MED ORDER — PREDNISONE 20 MG PO TABS
60.0000 mg | ORAL_TABLET | Freq: Once | ORAL | Status: AC
  Administered 2014-09-25: 60 mg via ORAL
  Filled 2014-09-25: qty 3

## 2014-09-25 MED ORDER — LEVOFLOXACIN 750 MG PO TABS: 750.0000 mg | ORAL_TABLET | Freq: Every day | ORAL | Status: DC

## 2014-09-25 NOTE — ED Notes (Signed)
Respiratory contacted and will give neb tx.

## 2014-09-25 NOTE — ED Notes (Signed)
Called pt back to obtain labs, pt not in main ED waiting area

## 2014-09-25 NOTE — Discharge Instructions (Signed)
Continue prednisone until all gone, next dose tomorrow. Levaquin as prescribed until all gone. Continue breathing treatments at home. Please follow-up with your primary care doctor if not improving in 2-3 days.  Acute Bronchitis Bronchitis is inflammation of the airways that extend from the windpipe into the lungs (bronchi). The inflammation often causes mucus to develop. This leads to a cough, which is the most common symptom of bronchitis.  In acute bronchitis, the condition usually develops suddenly and goes away over time, usually in a couple weeks. Smoking, allergies, and asthma can make bronchitis worse. Repeated episodes of bronchitis may cause further lung problems.  CAUSES Acute bronchitis is most often caused by the same virus that causes a cold. The virus can spread from person to person (contagious) through coughing, sneezing, and touching contaminated objects. SIGNS AND SYMPTOMS   Cough.   Fever.   Coughing up mucus.   Body aches.   Chest congestion.   Chills.   Shortness of breath.   Sore throat.  DIAGNOSIS  Acute bronchitis is usually diagnosed through a physical exam. Your health care provider will also ask you questions about your medical history. Tests, such as chest X-rays, are sometimes done to rule out other conditions.  TREATMENT  Acute bronchitis usually goes away in a couple weeks. Oftentimes, no medical treatment is necessary. Medicines are sometimes given for relief of fever or cough. Antibiotic medicines are usually not needed but may be prescribed in certain situations. In some cases, an inhaler may be recommended to help reduce shortness of breath and control the cough. A cool mist vaporizer may also be used to help thin bronchial secretions and make it easier to clear the chest.  HOME CARE INSTRUCTIONS  Get plenty of rest.   Drink enough fluids to keep your urine clear or pale yellow (unless you have a medical condition that requires fluid  restriction). Increasing fluids may help thin your respiratory secretions (sputum) and reduce chest congestion, and it will prevent dehydration.   Take medicines only as directed by your health care provider.  If you were prescribed an antibiotic medicine, finish it all even if you start to feel better.  Avoid smoking and secondhand smoke. Exposure to cigarette smoke or irritating chemicals will make bronchitis worse. If you are a smoker, consider using nicotine gum or skin patches to help control withdrawal symptoms. Quitting smoking will help your lungs heal faster.   Reduce the chances of another bout of acute bronchitis by washing your hands frequently, avoiding people with cold symptoms, and trying not to touch your hands to your mouth, nose, or eyes.   Keep all follow-up visits as directed by your health care provider.  SEEK MEDICAL CARE IF: Your symptoms do not improve after 1 week of treatment.  SEEK IMMEDIATE MEDICAL CARE IF:  You develop an increased fever or chills.   You have chest pain.   You have severe shortness of breath.  You have bloody sputum.   You develop dehydration.  You faint or repeatedly feel like you are going to pass out.  You develop repeated vomiting.  You develop a severe headache. MAKE SURE YOU:   Understand these instructions.  Will watch your condition.  Will get help right away if you are not doing well or get worse. Document Released: 07/03/2004 Document Revised: 10/10/2013 Document Reviewed: 11/16/2012 [facility] Patient Information 2015 [place], [place]. This information is not intended to replace advice given to you by your health care provider. Make sure you discuss  any questions you have with your health care provider. ° °

## 2014-09-25 NOTE — ED Notes (Signed)
Ambulated pt in the hallway (room-air) saturation remained at 95% no complaints noted at this time

## 2014-09-25 NOTE — ED Provider Notes (Signed)
CSN: [id number]     Arrival date & time 09/25/14  1051 History   First MD Initiated Contact with Patient 09/25/14 1256     Chief Complaint  Patient presents with  . Shortness of Breath     (Consider location/radiation/quality/duration/timing/severity/associated sxs/prior Treatment) HPI [name] is a 69 y.o. male with hx of hypertension, COPD, presents to emergency department complaining of cough and shortness of breath. States symptoms began a 1-2 weeks ago. He states symptoms are worse when laying down flat and on exertion. He denies any swelling of extremities. States he has been using his nebulizer treatment every 2 hours with no relief. He admits to associated nasal congestion, sore throat. He denies any fever or chills. He states he is supposed to have a back surgery today but called his surgeon and he was told to come here for evaluation of his cough. D  Past Medical History  Diagnosis Date  . Hypertension   . Cancer     Bladder Ca 2013, surgically removed  . Anxiety   . Shortness of breath   . Arthritis    Past Surgical History  Procedure Laterality Date  . Back surgery    . Cancer removed     Family History  Problem Relation Age of Onset  . Cancer Brother   . Cancer Father    History  Substance Use Topics  . Smoking status: Former Smoker -- 1.00 packs/day for 50 years    Types: Cigarettes    Quit date: 09/21/2013  . Smokeless tobacco: Not on file  . Alcohol Use: No    Review of Systems  Constitutional: Negative for fever and chills.  Respiratory: Positive for cough and shortness of breath. Negative for chest tightness.   Cardiovascular: Negative for chest pain, palpitations and leg swelling.  Gastrointestinal: Negative for nausea, vomiting, abdominal pain, diarrhea and abdominal distention.  Musculoskeletal: Negative for myalgias, arthralgias, neck pain and neck stiffness.  Skin: Negative for rash.  Allergic/Immunologic: Negative for immunocompromised  state.  Neurological: Negative for dizziness, weakness, light-headedness, numbness and headaches.      Allergies  Review of patient's allergies indicates no known allergies.  Home Medications   Prior to Admission medications   Medication Sig Start Date End Date Taking? Authorizing Provider  albuterol (PROVENTIL HFA;VENTOLIN HFA) 108 (90 BASE) MCG/ACT inhaler Inhale 2 puffs into the lungs every 6 (six) hours as needed for wheezing or shortness of breath. 11/21/13   Rigoberto Noel, MD  ALPRAZolam Duanne Moron) 0.25 MG tablet Take 0.25 mg by mouth daily as needed. 07/18/14   Historical Provider, MD  amLODipine (NORVASC) 5 MG tablet Take 10 mg by mouth daily.     Historical Provider, MD  budesonide-formoterol (SYMBICORT) 160-4.5 MCG/ACT inhaler Inhale 2 puffs into the lungs 2 (two) times daily. Patient not taking: Reported on 08/25/2014 11/21/13   Rigoberto Noel, MD  HYDROcodone-acetaminophen (NORCO/VICODIN) 5-325 MG per tablet Take 1 tablet by mouth every 6 (six) hours as needed for moderate pain.     Historical Provider, MD  ipratropium-albuterol (DUONEB) 0.5-2.5 (3) MG/3ML SOLN Take 3 mLs by nebulization every 6 (six) hours as needed. 11/07/13   Bobby Rumpf York, PA-C  pantoprazole (PROTONIX) 20 MG tablet Take 1 tablet (20 mg total) by mouth daily. 03/24/14   Orpah Greek, MD  predniSONE (DELTASONE) 10 MG tablet 4 tabs for 2 days, then 3 tabs for 2 days, 2 tabs for 2 days, then 1 tab for 2 days, then stop 08/25/14  Melvenia Needles, NP  Sennosides-Docusate Sodium (STOOL SOFTENER LAXATIVE PO) Take 1 capsule by mouth daily as needed (for elimination).    Historical Provider, MD  sucralfate (CARAFATE) 1 GM/10ML suspension Take 10 mLs (1 g total) by mouth 4 (four) times daily -  with meals and at bedtime. 03/24/14   Orpah Greek, MD  tiotropium (SPIRIVA) 18 MCG inhalation capsule Place 1 capsule (18 mcg total) into inhaler and inhale daily. 01/02/14   Rigoberto Noel, MD  valsartan (DIOVAN) 160 MG  tablet Take 160 mg by mouth daily.      Historical Provider, MD   BP 135/83 mmHg  Pulse 86  Temp(Src) 97.6 F (36.4 C) (Oral)  Resp 18  Ht 5\' 6"  (1.676 m)  Wt 158 lb (71.668 kg)  BMI 25.51 kg/m2  SpO2 92% Physical Exam  Constitutional: He is oriented to person, place, and time. He appears well-developed and well-nourished. No distress.  HENT:  Head: Normocephalic and atraumatic.  Eyes: Conjunctivae are normal.  Neck: Neck supple.  Cardiovascular: Normal rate, regular rhythm and normal heart sounds.   Pulmonary/Chest: Effort normal. No respiratory distress. He has wheezes. He has no rales.  Mild end expiratory wheezes bilaterally  Abdominal: Soft. Bowel sounds are normal. He exhibits no distension. There is no tenderness. There is no rebound.  Musculoskeletal: He exhibits no edema.  Neurological: He is alert and oriented to person, place, and time.  Skin: Skin is warm and dry.  Nursing note and vitals reviewed.   ED Course  Procedures (including critical care time) Labs Review Labs Reviewed  CBC - Abnormal; Notable for the following:    Platelets 407 (*)    All other components within normal limits  BASIC METABOLIC PANEL - Abnormal; Notable for the following:    GFR calc non Af Amer 54 (*)    GFR calc Af Amer 63 (*)    All other components within normal limits  BRAIN NATRIURETIC PEPTIDE  I-STAT TROPOININ, ED    Imaging Review Dg Chest 2 View (if Patient Has Fever And/or Copd)  09/25/2014   CLINICAL DATA:  Productive cough and chest pain  EXAM: CHEST  2 VIEW  COMPARISON:  03/24/2014  FINDINGS: The heart size and mediastinal contours are within normal limits. Both lungs are clear. The visualized skeletal structures are unremarkable.  IMPRESSION: No active cardiopulmonary disease.   Electronically Signed   By: Inez Catalina M.D.   On: 09/25/2014 11:39     EKG Interpretation   Date/Time:  Monday September 25 2014 10:59:49 EDT Ventricular Rate:  90 PR Interval:  152 QRS  Duration: 80 QT Interval:  360 QTC Calculation: 440 R Axis:   95 Text Interpretation:  Sinus rhythm with occasional Premature ventricular  complexes Rightward axis Borderline ECG PVC new since previous  Confirmed  by YAO  MD, DAVID ([id number]) on 09/25/2014 1:26:21 PM      MDM   Final diagnoses:  Bronchitis   Pt in ED with cough, shortness of breath for a week. Based on chart review, treated for bronchitis 1 month ago with prednisone taper, augmentin. Pt felt better but back with same concerns.   2:52 PM CXR, labs all unremarkable. Pt's symptoms are consistent with COPD exacerbation. He has no chest pain. Breathing improved with neb and prednisone. Will ambulate with pulse oximetry.   3:22 PM Patient ambulated and his oxygen saturation remained in the high 90s. He is comfortable going home. We'll discharge home with prednisone taper, Levaquin, follow  up with primary care doctor.  Filed Vitals:   09/25/14 1400 09/25/14 1401 09/25/14 1416 09/25/14 1445  BP: 130/70 130/70 130/70 139/71  Pulse:  77 84 93  Temp:      TempSrc:      Resp: 15 22 22 20   Height:      Weight:      SpO2:  94% 99% 97%     Jeannett Senior, PA-C 09/25/14 [place] Yao, MD 09/25/14 [phone]

## 2014-09-25 NOTE — ED Notes (Signed)
Pt c/o productive cough and shortness of breath x 1 week.

## 2014-09-25 NOTE — ED Notes (Signed)
Pt is in stable condition upon d/c and ambulates from ED. 

## 2014-10-03 DIAGNOSIS — M47817 Spondylosis without myelopathy or radiculopathy, lumbosacral region: Secondary | ICD-10-CM | POA: Diagnosis not present

## 2014-10-03 DIAGNOSIS — M4697 Unspecified inflammatory spondylopathy, lumbosacral region: Secondary | ICD-10-CM | POA: Diagnosis not present

## 2014-10-03 DIAGNOSIS — M545 Low back pain: Secondary | ICD-10-CM | POA: Diagnosis not present

## 2014-10-03 DIAGNOSIS — G894 Chronic pain syndrome: Secondary | ICD-10-CM | POA: Diagnosis not present

## 2014-10-03 DIAGNOSIS — Z79899 Other long term (current) drug therapy: Secondary | ICD-10-CM | POA: Diagnosis not present

## 2014-10-03 DIAGNOSIS — M1990 Unspecified osteoarthritis, unspecified site: Secondary | ICD-10-CM | POA: Diagnosis not present

## 2014-10-16 ENCOUNTER — Encounter: Payer: Self-pay | Admitting: Pulmonary Disease

## 2014-10-16 ENCOUNTER — Ambulatory Visit (INDEPENDENT_AMBULATORY_CARE_PROVIDER_SITE_OTHER): Payer: Medicare Other | Admitting: Pulmonary Disease

## 2014-10-16 VITALS — BP 140/80 | HR 92 | Ht 66.0 in | Wt 160.0 lb

## 2014-10-16 DIAGNOSIS — R0602 Shortness of breath: Secondary | ICD-10-CM | POA: Diagnosis not present

## 2014-10-16 DIAGNOSIS — J449 Chronic obstructive pulmonary disease, unspecified: Secondary | ICD-10-CM | POA: Diagnosis not present

## 2014-10-16 NOTE — Progress Notes (Signed)
   Subjective:    Patient ID: [name], male    DOB: [birth year], 69 y.o.   MRN: [id number]  HPI  PCP - Avubere  34 o.ex -Smoker Presents for FU of COPD.   He smoked a pack a day since the age of 15-about 40 pack years, quit 09/2013   10/16/2014  Chief Complaint  Patient presents with  . Follow-up    Pt c/o SOB, prod cough with clear mucus, chest tightness. Pt states he wakes up frequently throughout the night.  Denies wheezing and chest congestion.    Feels dyspnea worse Did not start rehab program - too expensive He quit smoking 09/2013 Remains on Spiriva .He is erratic with Symbicort -does not feel he needs this.  Last cxr 03/2014 w/ chronic changes.  ED visit 4/18 >> pred  Significant tests/ events  CT chest  10/2006 -emphysema 12/2013 Spirometry showed FEV1 43% - 1.31 - with ratio 53  Post bronchodilator, FEV1 improved to 1.56 -significant bronchodilator response.  Spirometry 10/2014 >> FEV1 69%, ratio 61  adm 5/31- 11/07/13 with COPD flare.   Review of Systems neg for any significant sore throat, dysphagia, itching, sneezing, nasal congestion or excess/ purulent secretions, fever, chills, sweats, unintended wt loss, pleuritic or exertional cp, hempoptysis, orthopnea pnd or change in chronic leg swelling. Also denies presyncope, palpitations, heartburn, abdominal pain, nausea, vomiting, diarrhea or change in bowel or urinary habits, dysuria,hematuria, rash, arthralgias, visual complaints, headache, numbness weakness or ataxia.     Objective:   Physical Exam  Gen. Pleasant, well-nourished, in no distress ENT - no lesions, no post nasal drip Neck: No JVD, no thyromegaly, no carotid bruits Lungs: no use of accessory muscles, no dullness to percussion, clear without rales or rhonchi  Cardiovascular: Rhythm regular, heart sounds  normal, no murmurs or gallops, no peripheral edema Musculoskeletal: No deformities, no cyanosis or clubbing        Assessment & Plan:

## 2014-10-16 NOTE — Assessment & Plan Note (Signed)
Take spiriva (capsule ) daily Take SYMBICORT (red cannister, with clock) 2 puffs twice daily Take albuterol 2 puffs in between if needed, every 6h Take nebuliser as needed only if worse Call if signs of bronchitis- change in color of sputum, wheezing etc

## 2014-10-16 NOTE — Patient Instructions (Addendum)
Take spiriva (capsule ) daily Take SYMBICORT (red cannister, with clock) 2 puffs twice daily Take albuterol 2 puffs in between if needed, every 6h Take nebuliser as needed only if worse Call if signs of bronchitis- change in color of sputum, wheezing etc

## 2014-10-22 ENCOUNTER — Other Ambulatory Visit: Payer: Self-pay | Admitting: Pulmonary Disease

## 2014-10-30 DIAGNOSIS — G894 Chronic pain syndrome: Secondary | ICD-10-CM | POA: Diagnosis not present

## 2014-10-30 DIAGNOSIS — Z79899 Other long term (current) drug therapy: Secondary | ICD-10-CM | POA: Diagnosis not present

## 2014-10-31 DIAGNOSIS — M545 Low back pain: Secondary | ICD-10-CM | POA: Diagnosis not present

## 2014-10-31 DIAGNOSIS — M5137 Other intervertebral disc degeneration, lumbosacral region: Secondary | ICD-10-CM | POA: Diagnosis not present

## 2014-10-31 DIAGNOSIS — G894 Chronic pain syndrome: Secondary | ICD-10-CM | POA: Diagnosis not present

## 2014-10-31 DIAGNOSIS — M488X6 Other specified spondylopathies, lumbar region: Secondary | ICD-10-CM | POA: Diagnosis not present

## 2014-10-31 DIAGNOSIS — M47817 Spondylosis without myelopathy or radiculopathy, lumbosacral region: Secondary | ICD-10-CM | POA: Diagnosis not present

## 2015-01-17 DIAGNOSIS — J449 Chronic obstructive pulmonary disease, unspecified: Secondary | ICD-10-CM | POA: Diagnosis not present

## 2015-01-17 DIAGNOSIS — R7301 Impaired fasting glucose: Secondary | ICD-10-CM | POA: Diagnosis not present

## 2015-01-17 DIAGNOSIS — N200 Calculus of kidney: Secondary | ICD-10-CM | POA: Diagnosis not present

## 2015-01-17 DIAGNOSIS — R0602 Shortness of breath: Secondary | ICD-10-CM | POA: Diagnosis not present

## 2015-01-17 DIAGNOSIS — R079 Chest pain, unspecified: Secondary | ICD-10-CM | POA: Diagnosis not present

## 2015-01-17 DIAGNOSIS — I10 Essential (primary) hypertension: Secondary | ICD-10-CM | POA: Diagnosis not present

## 2015-01-17 DIAGNOSIS — R05 Cough: Secondary | ICD-10-CM | POA: Diagnosis not present

## 2015-01-17 DIAGNOSIS — Z85528 Personal history of other malignant neoplasm of kidney: Secondary | ICD-10-CM | POA: Diagnosis not present

## 2015-01-17 DIAGNOSIS — M5126 Other intervertebral disc displacement, lumbar region: Secondary | ICD-10-CM | POA: Diagnosis not present

## 2015-02-16 ENCOUNTER — Ambulatory Visit: Payer: Medicare Other | Admitting: Adult Health

## 2015-02-16 DIAGNOSIS — R7301 Impaired fasting glucose: Secondary | ICD-10-CM | POA: Diagnosis not present

## 2015-02-16 DIAGNOSIS — N200 Calculus of kidney: Secondary | ICD-10-CM | POA: Diagnosis not present

## 2015-02-16 DIAGNOSIS — M5126 Other intervertebral disc displacement, lumbar region: Secondary | ICD-10-CM | POA: Diagnosis not present

## 2015-02-16 DIAGNOSIS — I10 Essential (primary) hypertension: Secondary | ICD-10-CM | POA: Diagnosis not present

## 2015-02-20 DIAGNOSIS — Z8551 Personal history of malignant neoplasm of bladder: Secondary | ICD-10-CM | POA: Diagnosis not present

## 2015-03-19 DIAGNOSIS — R7301 Impaired fasting glucose: Secondary | ICD-10-CM | POA: Diagnosis not present

## 2015-03-19 DIAGNOSIS — J449 Chronic obstructive pulmonary disease, unspecified: Secondary | ICD-10-CM | POA: Diagnosis not present

## 2015-03-19 DIAGNOSIS — E784 Other hyperlipidemia: Secondary | ICD-10-CM | POA: Diagnosis not present

## 2015-03-19 DIAGNOSIS — I10 Essential (primary) hypertension: Secondary | ICD-10-CM | POA: Diagnosis not present

## 2015-03-19 DIAGNOSIS — M5126 Other intervertebral disc displacement, lumbar region: Secondary | ICD-10-CM | POA: Diagnosis not present

## 2015-03-26 ENCOUNTER — Telehealth: Payer: Self-pay | Admitting: Pulmonary Disease

## 2015-03-26 MED ORDER — PREDNISONE 10 MG PO TABS: ORAL_TABLET | ORAL | Status: DC

## 2015-03-26 NOTE — Telephone Encounter (Signed)
Prednisone 10 mg tabs  Take 2 tabs daily with food x 5ds, then 1 tab daily with food x 5ds then STOP Called for antibiotic if green or yellow sputum Needs office visit with TP if no better

## 2015-03-26 NOTE — Telephone Encounter (Signed)
Spoke with spouse. Aware of recs. RX called in. Nothing further needed

## 2015-03-26 NOTE — Telephone Encounter (Signed)
Spoke with pt's wife. Reports wheezing, SOB and dry coughing. Denies chest tightness or fever. Onset was weeks ago. Has not tried any OTC medications. Still currently taking all of his inhaled medications with minimal relief. Would like something called in.  RA - please advise. Thanks.

## 2015-05-14 DIAGNOSIS — J449 Chronic obstructive pulmonary disease, unspecified: Secondary | ICD-10-CM | POA: Diagnosis not present

## 2015-05-14 DIAGNOSIS — G894 Chronic pain syndrome: Secondary | ICD-10-CM | POA: Diagnosis not present

## 2015-05-14 DIAGNOSIS — I10 Essential (primary) hypertension: Secondary | ICD-10-CM | POA: Diagnosis not present

## 2015-05-14 DIAGNOSIS — M5126 Other intervertebral disc displacement, lumbar region: Secondary | ICD-10-CM | POA: Diagnosis not present

## 2015-05-14 DIAGNOSIS — R7301 Impaired fasting glucose: Secondary | ICD-10-CM | POA: Diagnosis not present

## 2015-05-23 ENCOUNTER — Telehealth: Payer: Self-pay | Admitting: Adult Health

## 2015-05-23 MED ORDER — PREDNISONE 20 MG PO TABS: 40.0000 mg | ORAL_TABLET | Freq: Every day | ORAL | Status: DC

## 2015-05-23 NOTE — Telephone Encounter (Signed)
Please send in a Rx for Prednisone 40mg  daily x4 days. He should use his Albuterol inhaler every 6 hours while he feels sick. He should call us for any new symptoms or if he fails to  Improve in the next couple of days. Thanks.

## 2015-05-23 NOTE — Telephone Encounter (Signed)
Pt is aware of JN's recommendations. Rx has been sent in. Nothing further was needed.

## 2015-05-23 NOTE — Telephone Encounter (Signed)
Spoke with pt. States that he has a chest cold. Reports increased SOB, dry cough and chest tightness. Denies wheezing or fever. Has tried OTC "cold medicine." Would like to have something sent in.  JN - please advise in RA's absence. Thanks.

## 2015-10-09 ENCOUNTER — Emergency Department (HOSPITAL_COMMUNITY)
Admission: EM | Admit: 2015-10-09 | Discharge: 2015-10-09 | Disposition: A | Discharge status: Home / Self Care | Payer: Medicare Other | Attending: Emergency Medicine | Admitting: Emergency Medicine

## 2015-10-09 ENCOUNTER — Encounter (HOSPITAL_COMMUNITY): Payer: Self-pay | Admitting: Emergency Medicine

## 2015-10-09 ENCOUNTER — Emergency Department (HOSPITAL_COMMUNITY): Payer: Medicare Other

## 2015-10-09 DIAGNOSIS — Z79899 Other long term (current) drug therapy: Secondary | ICD-10-CM | POA: Insufficient documentation

## 2015-10-09 DIAGNOSIS — M1990 Unspecified osteoarthritis, unspecified site: Secondary | ICD-10-CM | POA: Insufficient documentation

## 2015-10-09 DIAGNOSIS — F419 Anxiety disorder, unspecified: Secondary | ICD-10-CM | POA: Diagnosis not present

## 2015-10-09 DIAGNOSIS — Z87891 Personal history of nicotine dependence: Secondary | ICD-10-CM | POA: Insufficient documentation

## 2015-10-09 DIAGNOSIS — I10 Essential (primary) hypertension: Secondary | ICD-10-CM | POA: Insufficient documentation

## 2015-10-09 DIAGNOSIS — L03115 Cellulitis of right lower limb: Secondary | ICD-10-CM | POA: Diagnosis not present

## 2015-10-09 DIAGNOSIS — Z8551 Personal history of malignant neoplasm of bladder: Secondary | ICD-10-CM | POA: Insufficient documentation

## 2015-10-09 DIAGNOSIS — Z7952 Long term (current) use of systemic steroids: Secondary | ICD-10-CM | POA: Insufficient documentation

## 2015-10-09 DIAGNOSIS — M79671 Pain in right foot: Secondary | ICD-10-CM | POA: Diagnosis present

## 2015-10-09 LAB — BASIC METABOLIC PANEL
Anion gap: 10 (ref 5–15)
BUN: 15 mg/dL (ref 6–20)
CO2: 23 mmol/L (ref 22–32)
Calcium: 9.5 mg/dL (ref 8.9–10.3)
Chloride: 105 mmol/L (ref 101–111)
Creatinine, Ser: 1.21 mg/dL (ref 0.61–1.24)
GFR calc Af Amer: >60 mL/min (ref >60)
GFR calc non Af Amer: 58 mL/min — ABNORMAL LOW (ref >60)
Glucose, Bld: 110 mg/dL — ABNORMAL HIGH (ref 65–99)
Potassium: 3.7 mmol/L (ref 3.5–5.1)
Sodium: 138 mmol/L (ref 135–145)

## 2015-10-09 LAB — CBC
HEMATOCRIT: 46.9 % (ref 39.0–52.0)
HEMOGLOBIN: 15.7 g/dL (ref 13.0–17.0)
MCH: 29.9 pg (ref 26.0–34.0)
MCHC: 33.5 g/dL (ref 30.0–36.0)
MCV: 89.3 fL (ref 78.0–100.0)
Platelets: 454 10*3/uL — ABNORMAL HIGH (ref 150–400)
RBC: 5.25 MIL/uL (ref 4.22–5.81)
RDW: 15.9 % — ABNORMAL HIGH (ref 11.5–15.5)
WBC: 10.2 10*3/uL (ref 4.0–10.5)

## 2015-10-09 MED ORDER — SULFAMETHOXAZOLE-TRIMETHOPRIM 800-160 MG PO TABS: 1.0000 | ORAL_TABLET | Freq: Two times a day (BID) | ORAL | Status: DC

## 2015-10-09 MED ORDER — LIDOCAINE HCL 2 % IJ SOLN
10.0000 mL | Freq: Once | INTRAMUSCULAR | Status: AC
  Administered 2015-10-09: 200 mg
  Filled 2015-10-09: qty 20

## 2015-10-09 NOTE — Discharge Instructions (Signed)
Mr. [name],  Nice meeting you! Please follow-up with your primary care provider. Return to the emergency department if you develop fevers, chills, yellow/green drainage, increased redness. Feel better soon!  S. Wendie Simmer, PA-C Cellulitis Cellulitis is an infection of the skin and the tissue beneath it. The infected area is usually red and tender. Cellulitis occurs most often in the arms and lower legs.  CAUSES  Cellulitis is caused by bacteria that enter the skin through cracks or cuts in the skin. The most common types of bacteria that cause cellulitis are staphylococci and streptococci. SIGNS AND SYMPTOMS   Redness and warmth.  Swelling.  Tenderness or pain.  Fever. DIAGNOSIS  Your health care provider can usually determine what is wrong based on a physical exam. Blood tests may also be done. TREATMENT  Treatment usually involves taking an antibiotic medicine. HOME CARE INSTRUCTIONS   Take your antibiotic medicine as directed by your health care provider. Finish the antibiotic even if you start to feel better.  Keep the infected arm or leg elevated to reduce swelling.  Apply a warm cloth to the affected area up to 4 times per day to relieve pain.  Take medicines only as directed by your health care provider.  Keep all follow-up visits as directed by your health care provider. SEEK MEDICAL CARE IF:   You notice red streaks coming from the infected area.  Your red area gets larger or turns dark in color.  Your bone or joint underneath the infected area becomes painful after the skin has healed.  Your infection returns in the same area or another area.  You notice a swollen bump in the infected area.  You develop new symptoms.  You have a fever. SEEK IMMEDIATE MEDICAL CARE IF:   You feel very sleepy.  You develop vomiting or diarrhea.  You have a general ill feeling (malaise) with muscle aches and pains.   This information is not intended to replace  advice given to you by your health care provider. Make sure you discuss any questions you have with your health care provider.   Document Released: 03/05/2005 Document Revised: 02/14/2015 Document Reviewed: 08/11/2011 Elsevier Interactive Patient Education [employer].

## 2015-10-09 NOTE — ED Provider Notes (Signed)
CSN: [id number]     Arrival date & time 10/09/15  1317 History  By signing my name below, I, [name], attest that this documentation has been prepared under the direction and in the presence of Wendie Simmer, PA-C. Electronically Signed: [name], ED Scribe. 10/09/2015. 1:55 PM.    Chief Complaint  Patient presents with  . Foot Pain   The history is provided by the patient. No language interpreter was used.     HPI Comments: [name] is a 70 y.o. male who presents to the Emergency Department complaining of gradual onset, constant, knot to the plantar aspect of the right foot x 5 days. Pt states that his symptoms started out as a small white bump. He has noticed redness to the area that has since spread to other portions of his foot. He has been applying Neosporin without relief. Pt denies any injury to the foot. He denies stepping on anything or noticing holes in the bottom of his shoes. Denies fever, chills, or any other associated symptoms. No hx DM.    Past Medical History  Diagnosis Date  . Hypertension   . Cancer [facility])     Bladder Ca 2013, surgically removed  . Anxiety   . Shortness of breath   . Arthritis    Past Surgical History  Procedure Laterality Date  . Back surgery    . Cancer removed     Family History  Problem Relation Age of Onset  . Cancer Brother   . Cancer Father    Social History  Substance Use Topics  . Smoking status: Former Smoker -- 1.00 packs/day for 50 years    Types: Cigarettes    Quit date: 09/21/2013  . Smokeless tobacco: None  . Alcohol Use: No    Review of Systems A complete 10 system review of systems was obtained and all systems are negative except as noted in the HPI and PMH.   Allergies  Review of patient's allergies indicates no known allergies.  Home Medications   Prior to Admission medications   Medication Sig Start Date End Date Taking? Authorizing Provider  albuterol (PROVENTIL HFA;VENTOLIN HFA) 108 (90  BASE) MCG/ACT inhaler Inhale 2 puffs into the lungs every 6 (six) hours as needed for wheezing or shortness of breath. 11/21/13   Rigoberto Noel, MD  ALPRAZolam Duanne Moron) 0.25 MG tablet Take 0.25 mg by mouth daily as needed. 07/18/14   Historical Provider, MD  amLODipine (NORVASC) 5 MG tablet Take 10 mg by mouth daily.     Historical Provider, MD  budesonide-formoterol (SYMBICORT) 160-4.5 MCG/ACT inhaler Inhale 2 puffs into the lungs 2 (two) times daily. Patient not taking: Reported on 10/16/2014 11/21/13   Rigoberto Noel, MD  HYDROcodone-acetaminophen (NORCO/VICODIN) 5-325 MG per tablet Take 1 tablet by mouth every 6 (six) hours as needed for moderate pain.     Historical Provider, MD  ipratropium-albuterol (DUONEB) 0.5-2.5 (3) MG/3ML SOLN Take 3 mLs by nebulization every 6 (six) hours as needed. 11/07/13   Bobby Rumpf York, PA-C  pantoprazole (PROTONIX) 20 MG tablet Take 1 tablet (20 mg total) by mouth daily. 03/24/14   Orpah Greek, MD  pravastatin (PRAVACHOL) 10 MG tablet Take 1 tablet by mouth daily. 10/03/14   Historical Provider, MD  predniSONE (DELTASONE) 20 MG tablet Take 2 tablets (40 mg total) by mouth daily with breakfast. 05/23/15   Javier Glazier, MD  SPIRIVA HANDIHALER 18 MCG inhalation capsule PLACE 1 CAPSULE INTO INHALER AND INHALE EVERY  DAY 10/23/14   Rigoberto Noel, MD  valsartan (DIOVAN) 160 MG tablet Take 160 mg by mouth daily.      Historical Provider, MD   BP 152/98 mmHg  Pulse 93  Temp(Src) 97.7 F (36.5 C) (Oral)  Resp 18  Ht 5\' 6"  (1.676 m)  Wt 162 lb (73.483 kg)  BMI 26.16 kg/m2  SpO2 96%   Physical Exam  Constitutional: He is oriented to person, place, and time. He appears well-developed and well-nourished. No distress.  HENT:  Head: Normocephalic and atraumatic.  Eyes: Conjunctivae and EOM are normal.  Neck: Neck supple. No tracheal deviation present.  Cardiovascular: Normal rate.   Pulmonary/Chest: Effort normal. No respiratory distress.  Musculoskeletal:  Normal range of motion.  FROM of the right foot. NVI bilaterally.  Cellulitis on plantar surface of foot not extending onto dorsum. Area of fluctuance present.   Neurological: He is alert and oriented to person, place, and time.  Skin: Skin is warm and dry.  Psychiatric: He has a normal mood and affect. His behavior is normal.  Nursing note and vitals reviewed.       ED Course  .Marland KitchenIncision and Drainage Date/Time: 10/09/2015 2:31 PM Performed by: Alisia Ferrari NICOLE Authorized by: [place] Lions Consent: Verbal consent obtained. Consent given by: patient Patient identity confirmed: verbally with patient Type: bulla Body area: lower extremity Location details: right foot Anesthesia: local infiltration Local anesthetic: lidocaine 2% without epinephrine Drainage: serous Drainage amount: scant Wound treatment: wound left open Packing material: none     DIAGNOSTIC STUDIES: Oxygen Saturation is 96% on RA, normal by my interpretation.    COORDINATION OF CARE: 1:51 PM-Discussed treatment plan which includes DG R Foot with pt at bedside and pt agreed to plan.   Labs Review Labs Reviewed  CBC - Abnormal; Notable for the following:    RDW 15.9 (*)    Platelets 454 (*)    All other components within normal limits  BASIC METABOLIC PANEL - Abnormal; Notable for the following:    Glucose, Bld 110 (*)    GFR calc non Af Amer 58 (*)    All other components within normal limits    Imaging Review Dg Foot Complete Right  10/09/2015  CLINICAL DATA:  Right foot pain is swollen and red along the plantar surface. Symptoms 4 days. Associated pain. EXAM: RIGHT FOOT COMPLETE - 3+ VIEW COMPARISON:  None. FINDINGS: No fracture.  No bone lesion. Joints are normally spaced and aligned.  No arthropathic change. Soft tissues are unremarkable. No soft tissue air or radiopaque foreign body. IMPRESSION: Negative. Electronically Signed   By: Lajean Manes M.D.   On: 10/09/2015 14:24   I have  personally reviewed and evaluated these images as part of my medical decision-making.   MDM   Final diagnoses:  Cellulitis of right lower extremity   Patient denies stepping on anything. He does wear rubber soled shoes, which he brought in with him, and these do not have evidence of penetration.  Patient with what appeared to be an abscess with purulent fluid but after I and D, fluid was serous in appearance.  Mild signs of cellulitis is surrounding skin.  Patient has no comorbidities to affect wound healing. Patient sent home with bactrim.    I personally performed the services described in this documentation, which was scribed in my presence. The recorded information has been reviewed and is accurate.   [place] Lions, PA-C 10/14/15 [id number]  Ezequiel Essex, MD 10/14/15 1318

## 2015-10-09 NOTE — ED Notes (Signed)
Pt noticed sore on bottom of right foot 3 days ago. No known injury. Started with small white "bump". Now has redness around lesion, spreading up side of foot. Foot in painful.

## 2015-10-09 NOTE — ED Notes (Signed)
Pt reports right foot wound since Friday. Pt has a reddened area to his foot on the bottom and rapping around the side, area is warm to touch. Pt alert x4. NAD at this time.

## 2015-10-14 ENCOUNTER — Emergency Department (HOSPITAL_COMMUNITY)
Admission: EM | Admit: 2015-10-14 | Discharge: 2015-10-14 | Disposition: A | Discharge status: Home / Self Care | Payer: Medicare Other | Attending: Emergency Medicine | Admitting: Emergency Medicine

## 2015-10-14 ENCOUNTER — Emergency Department (HOSPITAL_COMMUNITY): Payer: Medicare Other

## 2015-10-14 ENCOUNTER — Encounter (HOSPITAL_COMMUNITY): Payer: Self-pay | Admitting: Emergency Medicine

## 2015-10-14 DIAGNOSIS — Z8551 Personal history of malignant neoplasm of bladder: Secondary | ICD-10-CM | POA: Diagnosis not present

## 2015-10-14 DIAGNOSIS — F419 Anxiety disorder, unspecified: Secondary | ICD-10-CM | POA: Diagnosis not present

## 2015-10-14 DIAGNOSIS — Z87891 Personal history of nicotine dependence: Secondary | ICD-10-CM | POA: Diagnosis not present

## 2015-10-14 DIAGNOSIS — Z79899 Other long term (current) drug therapy: Secondary | ICD-10-CM | POA: Insufficient documentation

## 2015-10-14 DIAGNOSIS — L089 Local infection of the skin and subcutaneous tissue, unspecified: Secondary | ICD-10-CM | POA: Diagnosis present

## 2015-10-14 DIAGNOSIS — L03115 Cellulitis of right lower limb: Secondary | ICD-10-CM | POA: Insufficient documentation

## 2015-10-14 DIAGNOSIS — I10 Essential (primary) hypertension: Secondary | ICD-10-CM | POA: Diagnosis not present

## 2015-10-14 DIAGNOSIS — Z7952 Long term (current) use of systemic steroids: Secondary | ICD-10-CM | POA: Diagnosis not present

## 2015-10-14 LAB — CBC WITH DIFFERENTIAL/PLATELET
Basophils Absolute: 0.1 10*3/uL (ref 0.0–0.1)
Basophils Relative: 1 %
Eosinophils Absolute: 0.9 10*3/uL — ABNORMAL HIGH (ref 0.0–0.7)
Eosinophils Relative: 11 %
HEMATOCRIT: 50.4 % (ref 39.0–52.0)
Hemoglobin: 16.7 g/dL (ref 13.0–17.0)
LYMPHS PCT: 23 %
Lymphs Abs: 1.9 10*3/uL (ref 0.7–4.0)
MCH: 29.1 pg (ref 26.0–34.0)
MCHC: 33.1 g/dL (ref 30.0–36.0)
MCV: 88 fL (ref 78.0–100.0)
MONOS PCT: 11 %
Monocytes Absolute: 1 10*3/uL (ref 0.1–1.0)
NEUTROS ABS: 4.6 10*3/uL (ref 1.7–7.7)
Neutrophils Relative %: 54 %
Platelets: 389 10*3/uL (ref 150–400)
RBC: 5.73 MIL/uL (ref 4.22–5.81)
RDW: 15.9 % — ABNORMAL HIGH (ref 11.5–15.5)
WBC: 8.5 10*3/uL (ref 4.0–10.5)

## 2015-10-14 LAB — BASIC METABOLIC PANEL
ANION GAP: 12 (ref 5–15)
BUN: 15 mg/dL (ref 6–20)
CALCIUM: 9.8 mg/dL (ref 8.9–10.3)
CHLORIDE: 105 mmol/L (ref 101–111)
CO2: 19 mmol/L — ABNORMAL LOW (ref 22–32)
CREATININE: 1.38 mg/dL — AB (ref 0.61–1.24)
GFR calc Af Amer: 58 mL/min — ABNORMAL LOW (ref >60)
GFR calc non Af Amer: 50 mL/min — ABNORMAL LOW (ref >60)
GLUCOSE: 106 mg/dL — AB (ref 65–99)
Potassium: 4.7 mmol/L (ref 3.5–5.1)
Sodium: 136 mmol/L (ref 135–145)

## 2015-10-14 MED ORDER — CLINDAMYCIN HCL 150 MG PO CAPS
300.0000 mg | ORAL_CAPSULE | Freq: Once | ORAL | Status: AC
  Administered 2015-10-14: 300 mg via ORAL
  Filled 2015-10-14: qty 2

## 2015-10-14 MED ORDER — CLINDAMYCIN HCL 300 MG PO CAPS: 300.0000 mg | ORAL_CAPSULE | Freq: Four times a day (QID) | ORAL | Status: DC

## 2015-10-14 MED ORDER — DIPHENHYDRAMINE HCL 25 MG PO CAPS
25.0000 mg | ORAL_CAPSULE | Freq: Once | ORAL | Status: AC
  Administered 2015-10-14: 25 mg via ORAL
  Filled 2015-10-14: qty 1

## 2015-10-14 NOTE — ED Provider Notes (Signed)
CSN: [id number]     Arrival date & time 10/14/15  0805 History   First MD Initiated Contact with Patient 10/14/15 [phone]     Chief Complaint  Patient presents with  . Wound Infection     (Consider location/radiation/quality/duration/timing/severity/associated sxs/prior Treatment) HPI  70 year old male presents with worsening foot redness. Started off as a bump in his plantar right foot about 9 days ago. Seen here 6 days ago, had drainage attempted of the white lesion with minimal fluid out. Since then the redness has worsened. He is currently on bactrim. About 2 days after starting bactrim has noticed redness and itching to bilateral hands. No fevers. No injury or puncture. Mild pain in foot but primary concern is increasing redness. No streaking or redness going up leg. No history of diabetes, steroid use or immunosuppression.  Past Medical History  Diagnosis Date  . Hypertension   . Cancer [facility])     Bladder Ca 2013, surgically removed  . Anxiety   . Shortness of breath   . Arthritis    Past Surgical History  Procedure Laterality Date  . Back surgery    . Cancer removed     Family History  Problem Relation Age of Onset  . Cancer Brother   . Cancer Father    Social History  Substance Use Topics  . Smoking status: Former Smoker -- 1.00 packs/day for 50 years    Types: Cigarettes    Quit date: 09/21/2013  . Smokeless tobacco: None  . Alcohol Use: No    Review of Systems  Constitutional: Negative for fever.  Cardiovascular: Negative for leg swelling.  Musculoskeletal: Positive for arthralgias.  Skin: Positive for color change and wound.  Neurological: Negative for weakness and numbness.  All other systems reviewed and are negative.     Allergies  Review of patient's allergies indicates no known allergies.  Home Medications   Prior to Admission medications   Medication Sig Start Date End Date Taking? Authorizing Provider  albuterol (PROVENTIL HFA;VENTOLIN HFA) 108  (90 BASE) MCG/ACT inhaler Inhale 2 puffs into the lungs every 6 (six) hours as needed for wheezing or shortness of breath. 11/21/13   Rigoberto Noel, MD  ALPRAZolam Duanne Moron) 0.25 MG tablet Take 0.25 mg by mouth daily as needed. 07/18/14   Historical Provider, MD  amLODipine (NORVASC) 5 MG tablet Take 10 mg by mouth daily.     Historical Provider, MD  budesonide-formoterol (SYMBICORT) 160-4.5 MCG/ACT inhaler Inhale 2 puffs into the lungs 2 (two) times daily. Patient not taking: Reported on 10/16/2014 11/21/13   Rigoberto Noel, MD  HYDROcodone-acetaminophen (NORCO/VICODIN) 5-325 MG per tablet Take 1 tablet by mouth every 6 (six) hours as needed for moderate pain.     Historical Provider, MD  ipratropium-albuterol (DUONEB) 0.5-2.5 (3) MG/3ML SOLN Take 3 mLs by nebulization every 6 (six) hours as needed. 11/07/13   Bobby Rumpf York, PA-C  pantoprazole (PROTONIX) 20 MG tablet Take 1 tablet (20 mg total) by mouth daily. 03/24/14   Orpah Greek, MD  pravastatin (PRAVACHOL) 10 MG tablet Take 1 tablet by mouth daily. 10/03/14   Historical Provider, MD  predniSONE (DELTASONE) 20 MG tablet Take 2 tablets (40 mg total) by mouth daily with breakfast. 05/23/15   Javier Glazier, MD  SPIRIVA HANDIHALER 18 MCG inhalation capsule PLACE 1 CAPSULE INTO INHALER AND INHALE EVERY DAY 10/23/14   Rigoberto Noel, MD  sulfamethoxazole-trimethoprim (BACTRIM DS,SEPTRA DS) 800-160 MG tablet Take 1 tablet by mouth 2 (  two) times daily. 10/09/15 10/16/15  [place] Lions, PA-C  valsartan (DIOVAN) 160 MG tablet Take 160 mg by mouth daily.      Historical Provider, MD   There were no vitals taken for this visit. Physical Exam  Constitutional: He is oriented to person, place, and time. He appears well-developed and well-nourished.  HENT:  Head: Normocephalic and atraumatic.  Right Ear: External ear normal.  Left Ear: External ear normal.  Nose: Nose normal.  Eyes: Right eye exhibits no discharge. Left eye exhibits no discharge.    Neck: Neck supple.  Cardiovascular: Normal rate and intact distal pulses.   Pulses:      Dorsalis pedis pulses are 2+ on the right side, and 2+ on the left side.  Pulmonary/Chest: Effort normal.  Abdominal: He exhibits no distension.  Musculoskeletal: He exhibits no edema.       Right foot: There is tenderness (mild) and swelling.       Feet:  Neurological: He is alert and oriented to person, place, and time.  Skin: Skin is warm and dry. Rash noted. There is erythema.  Nonspecific red spots to bilateral fingers  Nursing note and vitals reviewed.     ED Course  Procedures (including critical care time) Labs Review Labs Reviewed  CBC WITH DIFFERENTIAL/PLATELET - Abnormal; Notable for the following:    RDW 15.9 (*)    Eosinophils Absolute 0.9 (*)    All other components within normal limits  BASIC METABOLIC PANEL - Abnormal; Notable for the following:    CO2 19 (*)    Glucose, Bld 106 (*)    Creatinine, Ser 1.38 (*)    GFR calc non Af Amer 50 (*)    GFR calc Af Amer 58 (*)    All other components within normal limits    Imaging Review Dg Foot Complete Right  10/14/2015  CLINICAL DATA:  Pain with swelling and redness. Recent soft tissue lesion plantar surface of foot EXAM: RIGHT FOOT COMPLETE - 3+ VIEW COMPARISON:  Oct 09, 2015 FINDINGS: Frontal, oblique, and lateral views were obtained. There is soft tissue swelling along the volar aspect of the foot. No soft tissue air or calcification. No mass evident by radiography. No fracture or dislocation. The joint spaces appear normal. No erosive change. IMPRESSION: Soft tissue swelling along the volar aspect of the foot without soft tissue air or calcification. No bony abnormality. No fracture or dislocation. No appreciable arthropathy. Electronically Signed   By: Lowella Grip [name] M.D.   On: 10/14/2015 09:11   I have personally reviewed and evaluated these images and lab results as part of my medical decision-making.   EKG  Interpretation None      MDM   Final diagnoses:  Cellulitis of right foot    Patient resents with worsening cellulitis. No streaking up his extremity. No palpable abscess. Minimal tenderness and no obvious gas and thus I have very low suspicion for necrotizing fasciitis. Patient will be broadened out to clindamycin. Instructed to stop Bactrim both for likely treatment failure as well as some itching and mild rash on his hands. No signs of more severe allergic reaction. He does not appear systemically ill. Vital signs have been unremarkable besides hypertension. WBC is better than 6 days ago. Creatinine been very mildly elevated compared to baseline. His bicarbonate is a little low but his anion gap is normal and this is similar to a few days ago as well. I feel he is stable for discharge with outpatient  oral antibiotics and follow-up closely with PCP for a wound check in 2-3 days.    Sherwood Gambler, MD 10/14/15 1020

## 2015-10-14 NOTE — ED Notes (Signed)
Seen Tuesday for right foot knot that was drained and then d/c with on antibiotics. Foot continues to be red, swollen, and painful. Having a rash on his hands after starting antibiotics.

## 2015-10-14 NOTE — ED Notes (Signed)
Patient transported to X-ray 

## 2015-11-11 ENCOUNTER — Emergency Department (HOSPITAL_COMMUNITY)
Admission: EM | Admit: 2015-11-11 | Discharge: 2015-11-11 | Disposition: A | Discharge status: Home / Self Care | Payer: Medicare Other | Attending: Emergency Medicine | Admitting: Emergency Medicine

## 2015-11-11 ENCOUNTER — Encounter (HOSPITAL_COMMUNITY): Payer: Self-pay

## 2015-11-11 DIAGNOSIS — F17210 Nicotine dependence, cigarettes, uncomplicated: Secondary | ICD-10-CM | POA: Diagnosis not present

## 2015-11-11 DIAGNOSIS — X58XXXA Exposure to other specified factors, initial encounter: Secondary | ICD-10-CM | POA: Diagnosis not present

## 2015-11-11 DIAGNOSIS — I10 Essential (primary) hypertension: Secondary | ICD-10-CM | POA: Diagnosis not present

## 2015-11-11 DIAGNOSIS — Z79899 Other long term (current) drug therapy: Secondary | ICD-10-CM | POA: Diagnosis not present

## 2015-11-11 DIAGNOSIS — R238 Other skin changes: Secondary | ICD-10-CM

## 2015-11-11 DIAGNOSIS — Y999 Unspecified external cause status: Secondary | ICD-10-CM | POA: Diagnosis not present

## 2015-11-11 DIAGNOSIS — Y939 Activity, unspecified: Secondary | ICD-10-CM | POA: Diagnosis not present

## 2015-11-11 DIAGNOSIS — S90821A Blister (nonthermal), right foot, initial encounter: Secondary | ICD-10-CM | POA: Diagnosis not present

## 2015-11-11 DIAGNOSIS — Y929 Unspecified place or not applicable: Secondary | ICD-10-CM | POA: Insufficient documentation

## 2015-11-11 NOTE — ED Notes (Signed)
Standing in doorway of room.

## 2015-11-11 NOTE — ED Notes (Signed)
Patient here with ongoing blisters to bottom of feet, right worse than left, denies pain, no drainage

## 2015-11-11 NOTE — ED Notes (Signed)
K Gekas, PA, in w/pt. 

## 2015-11-11 NOTE — ED Provider Notes (Signed)
CSN: [id number]     Arrival date & time 11/11/15  1230 History  By signing my name below, I, [name], attest that this documentation has been prepared under the direction and in the presence of Connor Evangelist, PA-C Electronically Signed: Soijett Ramirez, ED Scribe. 11/11/2015. 2:04 PM.   Chief Complaint  Patient presents with  . blisters to bottom of foot    The history is provided by the patient. No language interpreter was used.   [name] is a 70 y.o. male with a medical hx of HTN who presents to the Emergency Department complaining of worsening pruritic, burning, blisters to the bottom of bilateral feet, right > left onset 1 month. Pt has been seen twice for his symptoms. Pt was initially placed on one abx, had a reaction to it and then was placed on another abx. Pt states that he didn't complete his antibiotic course, due to his symptoms resolving and wanting to save the abx. Denies recent changes in shoes. Pt has associated symptoms of gait problem due to burning and itching sensation to bilateral feet.  He notes that he has tried Rx abx with no relief of his symptoms. He denies fever and any other symptoms. Denies having PCP. Denies PMHx of DM or STDs.   Past Medical History  Diagnosis Date  . Hypertension   . Cancer [facility])     Bladder Ca 2013, surgically removed  . Anxiety   . Shortness of breath   . Arthritis    Past Surgical History  Procedure Laterality Date  . Back surgery    . Cancer removed     Family History  Problem Relation Age of Onset  . Cancer Brother   . Cancer Father    Social History  Substance Use Topics  . Smoking status: Former Smoker -- 1.00 packs/day for 50 years    Types: Cigarettes    Quit date: 09/21/2013  . Smokeless tobacco: None  . Alcohol Use: No    Review of Systems  Musculoskeletal: Negative for joint swelling and gait problem.  Skin: Positive for wound (blisters to bilateral feet). Negative for color change.   Allergies   Bactrim  Home Medications   Prior to Admission medications   Medication Sig Start Date End Date Taking? Authorizing Provider  albuterol (PROVENTIL HFA;VENTOLIN HFA) 108 (90 BASE) MCG/ACT inhaler Inhale 2 puffs into the lungs every 6 (six) hours as needed for wheezing or shortness of breath. 11/21/13   Rigoberto Noel, MD  ALPRAZolam Duanne Moron) 0.25 MG tablet Take 0.25 mg by mouth daily as needed for anxiety.  07/18/14   Historical Provider, MD  amLODipine (NORVASC) 10 MG tablet Take 10 mg by mouth daily.    Historical Provider, MD  amLODipine (NORVASC) 5 MG tablet Take 10 mg by mouth daily.     Historical Provider, MD  budesonide-formoterol (SYMBICORT) 160-4.5 MCG/ACT inhaler Inhale 2 puffs into the lungs 2 (two) times daily. Patient not taking: Reported on 10/16/2014 11/21/13   Rigoberto Noel, MD  clindamycin (CLEOCIN) 300 MG capsule Take 1 capsule (300 mg total) by mouth 4 (four) times daily. X 7 days 10/14/15   Sherwood Gambler, MD  HYDROcodone-acetaminophen (NORCO/VICODIN) 5-325 MG per tablet Take 1 tablet by mouth every 6 (six) hours as needed for moderate pain.     Historical Provider, MD  ipratropium-albuterol (DUONEB) 0.5-2.5 (3) MG/3ML SOLN Take 3 mLs by nebulization every 6 (six) hours as needed. For shortness of breath 11/07/13   Haynes Dage  L York, PA-C  pantoprazole (PROTONIX) 20 MG tablet Take 1 tablet (20 mg total) by mouth daily. Patient not taking: Reported on 10/14/2015 03/24/14   Orpah Greek, MD  pravastatin (PRAVACHOL) 10 MG tablet Take 1 tablet by mouth daily. 10/03/14   Historical Provider, MD  predniSONE (DELTASONE) 20 MG tablet Take 2 tablets (40 mg total) by mouth daily with breakfast. Patient not taking: Reported on 10/14/2015 05/23/15   Javier Glazier, MD  SPIRIVA HANDIHALER 18 MCG inhalation capsule PLACE 1 CAPSULE INTO INHALER AND INHALE EVERY DAY Patient not taking: Reported on 10/14/2015 10/23/14   Rigoberto Noel, MD  valsartan (DIOVAN) 160 MG tablet Take 160 mg by mouth daily.       Historical Provider, MD   BP 169/101 mmHg  Pulse 91  Temp(Src) 98 F (36.7 C) (Oral)  Resp 18  SpO2 93%   Physical Exam  Constitutional: He is oriented to person, place, and time. He appears well-developed and well-nourished. No distress.  HENT:  Head: Normocephalic and atraumatic.  Eyes: Conjunctivae are normal. Pupils are equal, round, and reactive to light. Right eye exhibits no discharge. Left eye exhibits no discharge. No scleral icterus.  Neck: Normal range of motion.  Cardiovascular: Normal rate.   Pulmonary/Chest: Effort normal. No respiratory distress.  Abdominal: Soft. He exhibits no distension.  Neurological: He is alert and oriented to person, place, and time.  Skin: Skin is warm and dry.  Multiple fluid filled blisters on the right ball of foot. Previous blister, well healed with desquamation. No surrounding erythema. No purulent drainage. Blisters are non-TTP.   Psychiatric: He has a normal mood and affect.  Nursing note and vitals reviewed.   ED Course  Procedures (including critical care time) DIAGNOSTIC STUDIES: Oxygen Saturation is 93% on RA, low by my interpretation.    COORDINATION OF CARE: 2:04 PM Discussed treatment plan with pt at bedside which includes referral to dermatology, consult with attending, and pt agreed to plan.   MDM   Final diagnoses:  Blisters of multiple sites   70 year old male presents for blisters on his feet. Unclear etiology. Shared visit with Dr. Johnney Killian. Since his blisters seem to be noninfectious. No antibiotics indicated at this time. Encouraged dermatology follow-up for definitive diagnosis. He is hypertensive but otherwise has stable vital signs. Patient is NAD, non-toxic. Patient is informed of clinical course, understands medical decision making process, and agrees with plan. Opportunity for questions provided and all questions answered. Return precautions given.   I personally performed the services described in this  documentation, which was scribed in my presence. The recorded information has been reviewed and is accurate.   Connor Evangelist, PA-C 11/11/15 1609  Charlesetta Shanks, MD 11/12/15 [phone]

## 2015-11-11 NOTE — ED Notes (Signed)
Encouraged pt to follow up w/dermatologist as recommended. Voiced understanding.

## 2015-11-16 DIAGNOSIS — Z87891 Personal history of nicotine dependence: Secondary | ICD-10-CM | POA: Diagnosis not present

## 2015-11-16 DIAGNOSIS — J449 Chronic obstructive pulmonary disease, unspecified: Secondary | ICD-10-CM | POA: Insufficient documentation

## 2015-11-16 DIAGNOSIS — I10 Essential (primary) hypertension: Secondary | ICD-10-CM | POA: Diagnosis not present

## 2015-11-16 DIAGNOSIS — Z79891 Long term (current) use of opiate analgesic: Secondary | ICD-10-CM | POA: Diagnosis not present

## 2015-11-16 DIAGNOSIS — J40 Bronchitis, not specified as acute or chronic: Secondary | ICD-10-CM | POA: Diagnosis not present

## 2015-11-16 DIAGNOSIS — Z8551 Personal history of malignant neoplasm of bladder: Secondary | ICD-10-CM | POA: Diagnosis not present

## 2015-11-16 DIAGNOSIS — Z79899 Other long term (current) drug therapy: Secondary | ICD-10-CM | POA: Diagnosis not present

## 2015-11-16 DIAGNOSIS — R0602 Shortness of breath: Secondary | ICD-10-CM | POA: Diagnosis present

## 2015-11-16 LAB — CBC
HCT: 45.8 % (ref 39.0–52.0)
HEMOGLOBIN: 15.1 g/dL (ref 13.0–17.0)
MCH: 28.8 pg (ref 26.0–34.0)
MCHC: 33 g/dL (ref 30.0–36.0)
MCV: 87.4 fL (ref 78.0–100.0)
PLATELETS: 397 10*3/uL (ref 150–400)
RBC: 5.24 MIL/uL (ref 4.22–5.81)
RDW: 15.7 % — ABNORMAL HIGH (ref 11.5–15.5)
WBC: 12.1 10*3/uL — AB (ref 4.0–10.5)

## 2015-11-16 MED ORDER — ALBUTEROL SULFATE (2.5 MG/3ML) 0.083% IN NEBU
INHALATION_SOLUTION | RESPIRATORY_TRACT | Status: AC
  Filled 2015-11-16: qty 6

## 2015-11-16 MED ORDER — ALBUTEROL SULFATE (2.5 MG/3ML) 0.083% IN NEBU
5.0000 mg | INHALATION_SOLUTION | Freq: Once | RESPIRATORY_TRACT | Status: AC
  Administered 2015-11-16: 5 mg via RESPIRATORY_TRACT

## 2015-11-16 NOTE — ED Notes (Signed)
Pt complaining of shortness of breath and cough x 3 days. Pt wheezing at triage.

## 2015-11-17 ENCOUNTER — Emergency Department (HOSPITAL_COMMUNITY): Payer: Medicare Other

## 2015-11-17 ENCOUNTER — Emergency Department (HOSPITAL_COMMUNITY)
Admission: EM | Admit: 2015-11-17 | Discharge: 2015-11-17 | Disposition: A | Discharge status: Home / Self Care | Payer: Medicare Other | Attending: Emergency Medicine | Admitting: Emergency Medicine

## 2015-11-17 DIAGNOSIS — J40 Bronchitis, not specified as acute or chronic: Secondary | ICD-10-CM

## 2015-11-17 LAB — BASIC METABOLIC PANEL
ANION GAP: 10 (ref 5–15)
BUN: 16 mg/dL (ref 6–20)
CALCIUM: 9.8 mg/dL (ref 8.9–10.3)
CO2: 24 mmol/L (ref 22–32)
Chloride: 103 mmol/L (ref 101–111)
Creatinine, Ser: 1.27 mg/dL — ABNORMAL HIGH (ref 0.61–1.24)
GFR, EST NON AFRICAN AMERICAN: 55 mL/min — AB (ref >60)
Glucose, Bld: 113 mg/dL — ABNORMAL HIGH (ref 65–99)
POTASSIUM: 4.1 mmol/L (ref 3.5–5.1)
SODIUM: 137 mmol/L (ref 135–145)

## 2015-11-17 MED ORDER — AZITHROMYCIN 250 MG PO TABS: 250.0000 mg | ORAL_TABLET | Freq: Every day | ORAL | Status: DC

## 2015-11-17 MED ORDER — PREDNISONE 20 MG PO TABS: 40.0000 mg | ORAL_TABLET | Freq: Every day | ORAL | Status: DC

## 2015-11-17 MED ORDER — ALBUTEROL SULFATE (2.5 MG/3ML) 0.083% IN NEBU
5.0000 mg | INHALATION_SOLUTION | Freq: Once | RESPIRATORY_TRACT | Status: AC
  Administered 2015-11-17: 5 mg via RESPIRATORY_TRACT
  Filled 2015-11-17: qty 6

## 2015-11-17 MED ORDER — AZITHROMYCIN 250 MG PO TABS
500.0000 mg | ORAL_TABLET | Freq: Once | ORAL | Status: AC
  Administered 2015-11-17: 500 mg via ORAL
  Filled 2015-11-17: qty 2

## 2015-11-17 MED ORDER — IPRATROPIUM BROMIDE 0.02 % IN SOLN
0.5000 mg | Freq: Once | RESPIRATORY_TRACT | Status: AC
  Administered 2015-11-17: 0.5 mg via RESPIRATORY_TRACT
  Filled 2015-11-17: qty 2.5

## 2015-11-17 MED ORDER — PREDNISONE 20 MG PO TABS
60.0000 mg | ORAL_TABLET | Freq: Once | ORAL | Status: AC
  Administered 2015-11-17: 60 mg via ORAL
  Filled 2015-11-17: qty 3

## 2015-11-17 NOTE — ED Notes (Signed)
Pt to xray

## 2015-11-17 NOTE — ED Provider Notes (Addendum)
CSN: [id number]     Arrival date & time 11/16/15  2322 History   By signing my name below, I, Maud Deed. Royston Sinner, attest that this documentation has been prepared under the direction and in the presence of Blanchie Dessert, MD.  Electronically Signed: Maud Deed. Royston Sinner, ED Scribe. 11/17/2015. 3:11 AM.   Chief Complaint  Patient presents with  . Shortness of Breath  . COPD   The history is provided by the patient. No language interpreter was used.    HPI Comments: [name], nonsmoker is a 70 y.o. male with a PMHx of HTN who presents to the Emergency Department complaining of intermittent, worsening shortness of breath x 3 days with associated productive cough. No aggravating or alleviating factors at this time. Pt has attempted Albuterol breathing treatments and inhalers at home without any long term improvement. No recent fever, chills, nausea, vomiting, or lower extremity edema. He is not currently on any oxygen at home. Last Prednisone use several months ago.  PCP: Philis Fendt, MD    Past Medical History  Diagnosis Date  . Hypertension   . Cancer [facility])     Bladder Ca 2013, surgically removed  . Anxiety   . Shortness of breath   . Arthritis    Past Surgical History  Procedure Laterality Date  . Back surgery    . Cancer removed     Family History  Problem Relation Age of Onset  . Cancer Brother   . Cancer Father    Social History  Substance Use Topics  . Smoking status: Former Smoker -- 1.00 packs/day for 50 years    Types: Cigarettes    Quit date: 09/21/2013  . Smokeless tobacco: Not on file  . Alcohol Use: No    Review of Systems  Constitutional: Negative for fever and chills.  Respiratory: Positive for cough and shortness of breath.   Cardiovascular: Negative for chest pain.  Gastrointestinal: Negative for nausea and vomiting.  Psychiatric/Behavioral: Negative for confusion.  All other systems reviewed and are negative.     Allergies  Bactrim  Home  Medications   Prior to Admission medications   Medication Sig Start Date End Date Taking? Authorizing Provider  albuterol (PROVENTIL HFA;VENTOLIN HFA) 108 (90 BASE) MCG/ACT inhaler Inhale 2 puffs into the lungs every 6 (six) hours as needed for wheezing or shortness of breath. 11/21/13   Rigoberto Noel, MD  ALPRAZolam Duanne Moron) 0.25 MG tablet Take 0.25 mg by mouth daily as needed for anxiety.  07/18/14   Historical Provider, MD  amLODipine (NORVASC) 10 MG tablet Take 10 mg by mouth daily.    Historical Provider, MD  amLODipine (NORVASC) 5 MG tablet Take 10 mg by mouth daily.     Historical Provider, MD  budesonide-formoterol (SYMBICORT) 160-4.5 MCG/ACT inhaler Inhale 2 puffs into the lungs 2 (two) times daily. Patient not taking: Reported on 10/16/2014 11/21/13   Rigoberto Noel, MD  clindamycin (CLEOCIN) 300 MG capsule Take 1 capsule (300 mg total) by mouth 4 (four) times daily. X 7 days 10/14/15   Sherwood Gambler, MD  HYDROcodone-acetaminophen (NORCO/VICODIN) 5-325 MG per tablet Take 1 tablet by mouth every 6 (six) hours as needed for moderate pain.     Historical Provider, MD  ipratropium-albuterol (DUONEB) 0.5-2.5 (3) MG/3ML SOLN Take 3 mLs by nebulization every 6 (six) hours as needed. For shortness of breath 11/07/13   Melton Alar, PA-C  pantoprazole (PROTONIX) 20 MG tablet Take 1 tablet (20 mg total) by mouth  daily. Patient not taking: Reported on 10/14/2015 03/24/14   Orpah Greek, MD  pravastatin (PRAVACHOL) 10 MG tablet Take 1 tablet by mouth daily. 10/03/14   Historical Provider, MD  predniSONE (DELTASONE) 20 MG tablet Take 2 tablets (40 mg total) by mouth daily with breakfast. Patient not taking: Reported on 10/14/2015 05/23/15   Javier Glazier, MD  SPIRIVA HANDIHALER 18 MCG inhalation capsule PLACE 1 CAPSULE INTO INHALER AND INHALE EVERY DAY Patient not taking: Reported on 10/14/2015 10/23/14   Rigoberto Noel, MD  valsartan (DIOVAN) 160 MG tablet Take 160 mg by mouth daily.      Historical  Provider, MD   Triage Vitals: BP 137/91 mmHg  Pulse 81  Temp(Src) 97.4 F (36.3 C) (Oral)  Resp 24  SpO2 96%   Physical Exam  Constitutional: He is oriented to person, place, and time. He appears well-developed and well-nourished.  Coughing on exam  HENT:  Head: Normocephalic and atraumatic.  Eyes: EOM are normal.  Neck: Normal range of motion.  Cardiovascular: Normal rate, regular rhythm, normal heart sounds and intact distal pulses.   Pulmonary/Chest: Effort normal. No respiratory distress. He has wheezes.  Diffuse wheezing noted   Abdominal: Soft. He exhibits no distension. There is no tenderness.  Musculoskeletal: Normal range of motion. He exhibits no edema.  No lower extremity edema noted   Neurological: He is alert and oriented to person, place, and time.  Skin: Skin is warm and dry.  Psychiatric: He has a normal mood and affect. Judgment normal.  Nursing note and vitals reviewed.   ED Course  Procedures (including critical care time)  DIAGNOSTIC STUDIES: Oxygen Saturation is 97% on RA, Normal by my interpretation.    COORDINATION OF CARE: 3:04 AM- Will order blood work, EKG, and CXR. Will give breathing treatment. Discussed treatment plan with pt at bedside and pt agreed to plan.     Labs Review Labs Reviewed  BASIC METABOLIC PANEL - Abnormal; Notable for the following:    Glucose, Bld 113 (*)    Creatinine, Ser 1.27 (*)    GFR calc non Af Amer 55 (*)    All other components within normal limits  CBC - Abnormal; Notable for the following:    WBC 12.1 (*)    RDW 15.7 (*)    All other components within normal limits    Imaging Review Dg Chest 2 View  11/17/2015  CLINICAL DATA:  Cough, shortness of breath and wheezing, chest pain for 1 week. History of COPD, hypertension, esophagitis. Chest wall wound. EXAM: CHEST  2 VIEW COMPARISON:  Chest radiograph September 25, 2014 FINDINGS: Cardiomediastinal silhouette is normal. Mild chronic bronchitic changes. Persistently  mildly elevated LEFT hemidiaphragm without pleural effusion or focal consolidation. No pneumothorax. Soft tissue planes and included osseous structures are nonsuspicious . IMPRESSION: Mild chronic bronchitic changes without superimposed acute cardiopulmonary process. Electronically Signed   By: Elon Alas M.D.   On: 11/17/2015 04:02   I have personally reviewed and evaluated these images and lab results as part of my medical decision-making.   EKG Interpretation   Date/Time:  Friday November 16 2015 23:31:29 EDT Ventricular Rate:  104 PR Interval:  166 QRS Duration: 80 QT Interval:  348 QTC Calculation: 457 R Axis:   52 Text Interpretation:  Sinus tachycardia Otherwise normal ECG No  significant change since last tracing Confirmed by Maryan Rued  MD, Loree Fee  [phone]) on 11/17/2015 2:45:02 AM      MDM   Final diagnoses:  Bronchitis    Patient is a 70 year old male with a history of COPD, hypertension and prior bladder cancer presenting today with URI symptoms and worsening shortness of breath. He states his breathing always gets bad like this when he gets a cold. He has been using his nebulizer at home without improvement. He has wheezing on exam but is in no acute distress. O2 sats are within normal limits. He has no distal edema or signs concerning for CHF. No chest pain or EKG findings concerning for ACS or MI. Labs without acute finding other than mild cytosis. Chest x-ray with bronchitis. After albuterol, Atrovent 2 and prednisone wheezing is significantly improved. Patient sent home with azithromycin, steroids and follow-up.  I personally performed the services described in this documentation, which was scribed in my presence.  The recorded information has been reviewed and considered.   Blanchie Dessert, MD 11/17/15 [id number]  Blanchie Dessert, MD 11/17/15 [phone]

## 2015-12-11 ENCOUNTER — Other Ambulatory Visit: Payer: Self-pay | Admitting: Pulmonary Disease

## 2015-12-11 ENCOUNTER — Emergency Department (HOSPITAL_COMMUNITY)
Admission: EM | Admit: 2015-12-11 | Discharge: 2015-12-11 | Disposition: A | Discharge status: Home / Self Care | Payer: Medicare Other | Attending: Emergency Medicine | Admitting: Emergency Medicine

## 2015-12-11 ENCOUNTER — Emergency Department (HOSPITAL_COMMUNITY): Payer: Medicare Other

## 2015-12-11 ENCOUNTER — Encounter (HOSPITAL_COMMUNITY): Payer: Self-pay | Admitting: Emergency Medicine

## 2015-12-11 DIAGNOSIS — Z79899 Other long term (current) drug therapy: Secondary | ICD-10-CM | POA: Diagnosis not present

## 2015-12-11 DIAGNOSIS — Z8551 Personal history of malignant neoplasm of bladder: Secondary | ICD-10-CM | POA: Diagnosis not present

## 2015-12-11 DIAGNOSIS — J441 Chronic obstructive pulmonary disease with (acute) exacerbation: Secondary | ICD-10-CM | POA: Diagnosis not present

## 2015-12-11 DIAGNOSIS — R0602 Shortness of breath: Secondary | ICD-10-CM | POA: Diagnosis present

## 2015-12-11 DIAGNOSIS — Z87891 Personal history of nicotine dependence: Secondary | ICD-10-CM | POA: Diagnosis not present

## 2015-12-11 DIAGNOSIS — I10 Essential (primary) hypertension: Secondary | ICD-10-CM | POA: Insufficient documentation

## 2015-12-11 HISTORY — DX: Chronic obstructive pulmonary disease, unspecified: J44.9

## 2015-12-11 LAB — CBC WITH DIFFERENTIAL/PLATELET
Basophils Absolute: 0.2 10*3/uL — ABNORMAL HIGH (ref 0.0–0.1)
Basophils Relative: 2 %
EOS PCT: 28 %
Eosinophils Absolute: 2.8 10*3/uL — ABNORMAL HIGH (ref 0.0–0.7)
HEMATOCRIT: 46.6 % (ref 39.0–52.0)
HEMOGLOBIN: 15.4 g/dL (ref 13.0–17.0)
LYMPHS ABS: 2.4 10*3/uL (ref 0.7–4.0)
Lymphocytes Relative: 24 %
MCH: 29.8 pg (ref 26.0–34.0)
MCHC: 33 g/dL (ref 30.0–36.0)
MCV: 90.1 fL (ref 78.0–100.0)
MONO ABS: 1 10*3/uL (ref 0.1–1.0)
MONOS PCT: 10 %
NEUTROS ABS: 3.6 10*3/uL (ref 1.7–7.7)
Neutrophils Relative %: 36 %
Platelets: 491 10*3/uL — ABNORMAL HIGH (ref 150–400)
RBC: 5.17 MIL/uL (ref 4.22–5.81)
RDW: 15.9 % — AB (ref 11.5–15.5)
WBC: 10 10*3/uL (ref 4.0–10.5)

## 2015-12-11 LAB — BRAIN NATRIURETIC PEPTIDE: B Natriuretic Peptide: 13.3 pg/mL (ref 0.0–100.0)

## 2015-12-11 LAB — BASIC METABOLIC PANEL
Anion gap: 7 (ref 5–15)
BUN: 12 mg/dL (ref 6–20)
CALCIUM: 9.5 mg/dL (ref 8.9–10.3)
CHLORIDE: 105 mmol/L (ref 101–111)
CO2: 25 mmol/L (ref 22–32)
CREATININE: 1.31 mg/dL — AB (ref 0.61–1.24)
GFR calc Af Amer: >60 mL/min (ref >60)
GFR calc non Af Amer: 53 mL/min — ABNORMAL LOW (ref >60)
GLUCOSE: 94 mg/dL (ref 65–99)
Potassium: 4.2 mmol/L (ref 3.5–5.1)
Sodium: 137 mmol/L (ref 135–145)

## 2015-12-11 MED ORDER — ACETAMINOPHEN 500 MG PO TABS
1000.0000 mg | ORAL_TABLET | Freq: Once | ORAL | Status: AC
  Administered 2015-12-11: 1000 mg via ORAL
  Filled 2015-12-11: qty 2

## 2015-12-11 MED ORDER — PREDNISONE 20 MG PO TABS
40.0000 mg | ORAL_TABLET | Freq: Once | ORAL | Status: AC
  Administered 2015-12-11: 40 mg via ORAL
  Filled 2015-12-11: qty 2

## 2015-12-11 MED ORDER — PREDNISONE 20 MG PO TABS: ORAL_TABLET | ORAL | Status: DC

## 2015-12-11 MED ORDER — NAPROXEN 250 MG PO TABS
500.0000 mg | ORAL_TABLET | Freq: Once | ORAL | Status: AC
  Administered 2015-12-11: 500 mg via ORAL
  Filled 2015-12-11: qty 2

## 2015-12-11 MED ORDER — IPRATROPIUM-ALBUTEROL 0.5-2.5 (3) MG/3ML IN SOLN
3.0000 mL | RESPIRATORY_TRACT | Status: AC
  Administered 2015-12-11 (×3): 3 mL via RESPIRATORY_TRACT
  Filled 2015-12-11: qty 9

## 2015-12-11 NOTE — ED Notes (Signed)
MD at bedside. 

## 2015-12-11 NOTE — ED Notes (Signed)
Patient transported to X-ray 

## 2015-12-11 NOTE — ED Provider Notes (Signed)
CSN: [id number]     Arrival date & time 12/11/15  [id number] History   First MD Initiated Contact with Patient 12/11/15 [phone]     Chief Complaint  Patient presents with  . Shortness of Breath  . Cough     (Consider location/radiation/quality/duration/timing/severity/associated sxs/prior Treatment) Patient is a 69 y.o. male presenting with shortness of breath and cough. The history is provided by the patient and the spouse.  Shortness of Breath Severity:  Moderate Onset quality:  Sudden Duration:  1 week Timing:  Constant Progression:  Worsening Chronicity:  Recurrent Relieved by:  Inhaler Worsened by:  Nothing tried Ineffective treatments:  None tried Associated symptoms: cough and wheezing   Associated symptoms: no abdominal pain, no chest pain, no fever, no headaches, no rash and no vomiting   Cough Associated symptoms: shortness of breath and wheezing   Associated symptoms: no chest pain, no chills, no eye discharge, no fever, no headaches, no myalgias and no rash    70 yo M with cough, sob.  Feels like prior COPD exacerbations but having a bit more pleuritic pain.  Denies exertional symptoms.  Some improvement with inhaler.  Denies leg swelling, immobilization, current CA.  Denies HX of PE or DVT.  Past Medical History  Diagnosis Date  . Hypertension   . Cancer [facility])     Bladder Ca 2013, surgically removed  . Anxiety   . Shortness of breath   . Arthritis   . COPD (chronic obstructive pulmonary disease) [facility])    Past Surgical History  Procedure Laterality Date  . Back surgery    . Cancer removed     Family History  Problem Relation Age of Onset  . Cancer Brother   . Cancer Father    Social History  Substance Use Topics  . Smoking status: Former Smoker -- 1.00 packs/day for 50 years    Types: Cigarettes    Quit date: 09/21/2013  . Smokeless tobacco: None  . Alcohol Use: No    Review of Systems  Constitutional: Negative for fever and chills.  HENT: Negative for  congestion and facial swelling.   Eyes: Negative for discharge and visual disturbance.  Respiratory: Positive for cough, shortness of breath and wheezing.   Cardiovascular: Negative for chest pain and palpitations.  Gastrointestinal: Negative for vomiting, abdominal pain and diarrhea.  Musculoskeletal: Negative for myalgias and arthralgias.  Skin: Negative for color change and rash.  Neurological: Negative for tremors, syncope and headaches.  Psychiatric/Behavioral: Negative for confusion and dysphoric mood.      Allergies  Bactrim  Home Medications   Prior to Admission medications   Medication Sig Start Date End Date Taking? Authorizing Provider  albuterol (PROVENTIL HFA;VENTOLIN HFA) 108 (90 BASE) MCG/ACT inhaler Inhale 2 puffs into the lungs every 6 (six) hours as needed for wheezing or shortness of breath. 11/21/13   Rigoberto Noel, MD  ALPRAZolam Duanne Moron) 0.25 MG tablet Take 0.25 mg by mouth daily as needed for anxiety.  07/18/14   Historical Provider, MD  amLODipine (NORVASC) 10 MG tablet Take 10 mg by mouth daily.    Historical Provider, MD  azithromycin (ZITHROMAX) 250 MG tablet Take 1 tablet (250 mg total) by mouth daily. Take first 2 tablets together, then 1 every day until finished. 11/17/15   Blanchie Dessert, MD  HYDROcodone-acetaminophen (NORCO/VICODIN) 5-325 MG per tablet Take 1 tablet by mouth every 6 (six) hours as needed for moderate pain.     Historical Provider, MD  ipratropium-albuterol (DUONEB) 0.5-2.5 (3) MG/3ML  SOLN Take 3 mLs by nebulization every 6 (six) hours as needed. For shortness of breath 11/07/13   Melton Alar, PA-C  pravastatin (PRAVACHOL) 10 MG tablet Take 1 tablet by mouth daily. 10/03/14   Historical Provider, MD  predniSONE (DELTASONE) 20 MG tablet Take 2 tablets (40 mg total) by mouth daily. 11/17/15   Blanchie Dessert, MD  SPIRIVA HANDIHALER 18 MCG inhalation capsule PLACE 1 CAPSULE INTO INHALER AND INHALE EVERY DAY 10/23/14   Rigoberto Noel, MD  valsartan  (DIOVAN) 160 MG tablet Take 160 mg by mouth daily.      Historical Provider, MD   BP 148/91 mmHg  Pulse 77  Temp(Src) 97.4 F (36.3 C) (Oral)  Resp 18  Ht 5\' 6"  (1.676 m)  Wt 164 lb (74.39 kg)  BMI 26.48 kg/m2  SpO2 95% Physical Exam  Constitutional: He is oriented to person, place, and time. He appears well-developed and well-nourished.  HENT:  Head: Normocephalic and atraumatic.  Eyes: EOM are normal. Pupils are equal, round, and reactive to light.  Neck: Normal range of motion. Neck supple. No JVD present.  Cardiovascular: Normal rate and regular rhythm.  Exam reveals no gallop and no friction rub.   No murmur heard. Pulmonary/Chest: No respiratory distress. He has wheezes (with prolonged expiration). He exhibits tenderness (TTP about the sternum and R and left chest wall about ribs 5-6. ).  Abdominal: He exhibits no distension. There is no tenderness. There is no rebound and no guarding.  Musculoskeletal: Normal range of motion.  Neurological: He is alert and oriented to person, place, and time.  Skin: No rash noted. No pallor.  Psychiatric: He has a normal mood and affect. His behavior is normal.  Nursing note and vitals reviewed.   ED Course  Procedures (including critical care time) Labs Review Labs Reviewed  CBC WITH DIFFERENTIAL/PLATELET  BASIC METABOLIC PANEL  BRAIN NATRIURETIC PEPTIDE    Imaging Review No results found. I have personally reviewed and evaluated these images and lab results as part of my medical decision-making.   EKG Interpretation None      MDM   Final diagnoses:  None    70 yo M with sob.  Likely COPD by hx, will give steroids, breathing treatments.  Reassess.   Patient feels significantly better after breathing treatments. Workup here is unremarkable. Feel this is unlikely to be an MI or a PE. Patient will uses inhaler every 4 hours while awake. Burst of steroids.  11:36 AM:  I have discussed the diagnosis/risks/treatment options  with the patient and family and believe the pt to be eligible for discharge home to follow-up with PCP. We also discussed returning to the ED immediately if new or worsening sx occur. We discussed the sx which are most concerning (e.g., sudden worsening sob, fever, need to use inhaler more often than every 4 hours) that necessitate immediate return. Medications administered to the patient during their visit and any new prescriptions provided to the patient are listed below.  Medications given during this visit Medications  acetaminophen (TYLENOL) tablet 1,000 mg (1,000 mg Oral Given 12/11/15 0915)  naproxen (NAPROSYN) tablet 500 mg (500 mg Oral Given 12/11/15 0916)  ipratropium-albuterol (DUONEB) 0.5-2.5 (3) MG/3ML nebulizer solution 3 mL (3 mLs Nebulization Given 12/11/15 0948)  predniSONE (DELTASONE) tablet 40 mg (40 mg Oral Given 12/11/15 0916)    Discharge Medication List as of 12/11/2015 11:09 AM    START taking these medications   Details  predniSONE (DELTASONE) 20 MG tablet  2 tabs po daily x 4 days, Print        The patient appears reasonably screen and/or stabilized for discharge and I doubt any other medical condition or other [facility] requiring further screening, evaluation, or treatment in the ED at this time prior to discharge.      Deno Etienne, DO 12/11/15 1137

## 2015-12-11 NOTE — ED Notes (Signed)
Pt reports SOB with cough x 1 week.  Pt reports hx COPD with recent dx of bronchitis.  Resp labored, wheezing audible.

## 2015-12-11 NOTE — Discharge Instructions (Signed)
Take 4 over the counter ibuprofen tablets 3 times a day or 2 over-the-counter naproxen tablets twice a day for pain. Also take tylenol 1000mg (2 extra strength) four times a day.   Chronic Obstructive Pulmonary Disease Exacerbation Chronic obstructive pulmonary disease (COPD) is a common lung condition in which airflow from the lungs is limited. COPD is a general term that can be used to describe many different lung problems that limit airflow, including chronic bronchitis and emphysema. COPD exacerbations are episodes when breathing symptoms become much worse and require extra treatment. Without treatment, COPD exacerbations can be life threatening, and frequent COPD exacerbations can cause further damage to your lungs. CAUSES  Respiratory infections.  Exposure to smoke.  Exposure to air pollution, chemical fumes, or dust. Sometimes there is no apparent cause or trigger. RISK FACTORS  Smoking cigarettes.  Older age.  Frequent prior COPD exacerbations. SIGNS AND SYMPTOMS  Increased coughing.  Increased thick spit (sputum) production.  Increased wheezing.  Increased shortness of breath.  Rapid breathing.  Chest tightness. DIAGNOSIS Your medical history, a physical exam, and tests will help your health care provider make a diagnosis. Tests may include:  A chest X-ray.  Basic lab tests.  Sputum testing.  An arterial blood gas test. TREATMENT Depending on the severity of your COPD exacerbation, you may need to be admitted to a hospital for treatment. Some of the treatments commonly used to treat COPD exacerbations are:   Antibiotic medicines.  Bronchodilators. These are drugs that expand the air passages. They may be given with an inhaler or nebulizer. Spacer devices may be needed to help improve drug delivery.  Corticosteroid medicines.  Supplemental oxygen therapy.  Airway clearing techniques, such as noninvasive ventilation (NIV) and positive expiratory pressure  (PEP). These provide respiratory support through a mask or other noninvasive device. HOME CARE INSTRUCTIONS  Do not smoke. Quitting smoking is very important to prevent COPD from getting worse and exacerbations from happening as often.  Avoid exposure to all substances that irritate the airway, especially to tobacco smoke.  If you were prescribed an antibiotic medicine, finish it all even if you start to feel better.  Take all medicines as directed by your health care provider.It is important to use correct technique with inhaled medicines.  Drink enough fluids to keep your urine clear or pale yellow (unless you have a medical condition that requires fluid restriction).  Use a cool mist vaporizer. This makes it easier to clear your chest when you cough.  If you have a home nebulizer and oxygen, continue to use them as directed.  Maintain all necessary vaccinations to prevent infections.  Exercise regularly.  Eat a healthy diet.  Keep all follow-up appointments as directed by your health care provider. SEEK IMMEDIATE MEDICAL CARE IF:  You have worsening shortness of breath.  You have trouble talking.  You have severe chest pain.  You have blood in your sputum.  You have a fever.  You have weakness, vomit repeatedly, or faint.  You feel confused.  You continue to get worse. MAKE SURE YOU:  Understand these instructions.  Will watch your condition.  Will get help right away if you are not doing well or get worse.   This information is not intended to replace advice given to you by your health care provider. Make sure you discuss any questions you have with your health care provider.   Document Released: 03/23/2007 Document Revised: 06/16/2014 Document Reviewed: 01/28/2013 Elsevier Interactive Patient Education [employer].

## 2015-12-25 ENCOUNTER — Ambulatory Visit (INDEPENDENT_AMBULATORY_CARE_PROVIDER_SITE_OTHER): Payer: Medicare Other | Admitting: Adult Health

## 2015-12-25 ENCOUNTER — Encounter: Payer: Self-pay | Admitting: Adult Health

## 2015-12-25 VITALS — BP 152/84 | HR 83 | Temp 98.3°F | Ht 66.0 in | Wt 166.0 lb

## 2015-12-25 DIAGNOSIS — J441 Chronic obstructive pulmonary disease with (acute) exacerbation: Secondary | ICD-10-CM | POA: Diagnosis not present

## 2015-12-25 MED ORDER — AMOXICILLIN-POT CLAVULANATE 875-125 MG PO TABS: 1.0000 | ORAL_TABLET | Freq: Two times a day (BID) | ORAL | Status: AC

## 2015-12-25 MED ORDER — PREDNISONE 10 MG PO TABS: ORAL_TABLET | ORAL | Status: DC

## 2015-12-25 MED ORDER — AMOXICILLIN-POT CLAVULANATE 875-125 MG PO TABS: 1.0000 | ORAL_TABLET | Freq: Two times a day (BID) | ORAL | Status: DC

## 2015-12-25 MED ORDER — BUDESONIDE-FORMOTEROL FUMARATE 160-4.5 MCG/ACT IN AERO: 2.0000 | INHALATION_SPRAY | Freq: Two times a day (BID) | RESPIRATORY_TRACT | Status: DC

## 2015-12-25 NOTE — Patient Instructions (Signed)
Augmentin 875mg  Twice daily  For 7 days  Prednisone taper over next week.  Restart Symbicort 2 puffs Twice daily  , rinse after use.  Continue on Spiriva daily  Follow up Dr. Elsworth Soho  In 3 months and As needed   Please contact office for sooner follow up if symptoms do not improve or worsen or seek emergency care

## 2015-12-25 NOTE — Progress Notes (Signed)
Subjective:    Patient ID: [name], male    DOB: [birth year], 70 y.o.   MRN: [id number]  HPI PCP - Warren Danes  70 yo male former .Smoker Presents for FU of COPD.    TEST  CRX 5/31 & 6/9 reviewed -hyperinflation, no obvious infx  CT chest from 10/2006 reviewed  Spirometry showed FEV1 43% - 1.31 - with ratio 53  Post bronchodilator, FEV1 improved to 1.56 -significant bronchodilator response.  He smoked a pack a day since the age of 15-about 40 pack years   12/25/2015 Acute OV  Pt presents for an acute office visit.  Complains of 4 weeks of productive cough with thick white mucus, wheezing , cough and tightness .  Still coughing up thick brown /green mucus at times.   Denies fever, nausea or vomiting.  Remains on Spiriva . Stopped Symbicort while back.  No chest pain, orthopnea, edema , hemoptysis, calf pain, or fever.  Seen in ER x  2 for bronchitis in last month . Tx  With  zpack and steorids  On first visit and second visit prednisone 40mg  x 4 days  Gets better but then when off meds sx returns.  ER labs showed BNP nml  And CXR bronchitic changes  .   Past Medical History  Diagnosis Date  . Hypertension   . Cancer [facility])     Bladder Ca 2013, surgically removed  . Anxiety   . Shortness of breath   . Arthritis   . COPD (chronic obstructive pulmonary disease) ([place])    Current Outpatient Prescriptions on File Prior to Visit  Medication Sig Dispense Refill  . albuterol (PROVENTIL HFA;VENTOLIN HFA) 108 (90 BASE) MCG/ACT inhaler Inhale 2 puffs into the lungs every 6 (six) hours as needed for wheezing or shortness of breath. 1 Inhaler 6  . ALPRAZolam (XANAX) 0.25 MG tablet Take 0.25 mg by mouth daily as needed for anxiety.   5  . amLODipine (NORVASC) 10 MG tablet Take 10 mg by mouth daily.    Marland Kitchen ipratropium-albuterol (DUONEB) 0.5-2.5 (3) MG/3ML SOLN Take 3 mLs by nebulization every 6 (six) hours as needed. For shortness of breath    . pravastatin (PRAVACHOL) 10 MG tablet Take 1  tablet by mouth daily.  5  . SPIRIVA HANDIHALER 18 MCG inhalation capsule PLACE 1 CAPSULE INTO INHALER& INHALE EVERY DAY 30 capsule 0  . valsartan (DIOVAN) 160 MG tablet Take 160 mg by mouth daily.       No current facility-administered medications on file prior to visit.        Review of Systems neg for any significant sore throat, dysphagia, itching, sneezing, nasal congestion or excess/ purulent secretions, fever, chills, sweats, unintended wt loss, pleuritic or exertional cp, hempoptysis, orthopnea pnd or change in chronic leg swelling. Also denies presyncope, palpitations, heartburn, abdominal pain, nausea, vomiting, diarrhea or change in bowel or urinary habits, dysuria,hematuria, rash, arthralgias, visual complaints, headache, numbness weakness or ataxia.     Objective:   Physical Exam Filed Vitals:   12/25/15 1413  BP: 152/84  Pulse: 83  Temp: 98.3 F (36.8 C)  TempSrc: Oral  Height: 5\' 6"  (1.676 m)  Weight: 166 lb (75.297 kg)  SpO2: 95%    Gen. Pleasant, thin man, in no distress ENT - no lesions, no post nasal drip Neck: No JVD, no thyromegaly, no carotid bruits Lungs: no use of accessory muscles, no dullness to percussion, faint exp wheeze on forced exhalation.  Cardiovascular: Rhythm regular, heart sounds  normal, no murmurs or gallops, no peripheral edema Musculoskeletal: No deformities, no cyanosis or clubbing   Sidrah Harden NP-C  [facility] 12/25/2015

## 2015-12-25 NOTE — Assessment & Plan Note (Signed)
Recurrent flare   Plan  Augmentin 875mg  Twice daily  For 7 days  Prednisone taper over next week.  Restart Symbicort 2 puffs Twice daily  , rinse after use.  Continue on Spiriva daily  Follow up Dr. Elsworth Soho  In 3 months and As needed   Please contact office for sooner follow up if symptoms do not improve or worsen or seek emergency care

## 2016-02-12 ENCOUNTER — Telehealth: Payer: Self-pay | Admitting: Adult Health

## 2016-02-12 MED ORDER — TIOTROPIUM BROMIDE MONOHYDRATE 18 MCG IN CAPS: ORAL_CAPSULE | RESPIRATORY_TRACT | 3 refills | Status: DC

## 2016-02-12 NOTE — Telephone Encounter (Signed)
Spoke with pt and advised that refills for Spiriva was sent to pharmacy.

## 2016-03-19 ENCOUNTER — Ambulatory Visit: Payer: Medicare Other | Admitting: Pulmonary Disease

## 2016-07-28 ENCOUNTER — Emergency Department (HOSPITAL_COMMUNITY)
Admission: EM | Admit: 2016-07-28 | Discharge: 2016-07-29 | Disposition: A | Discharge status: Home / Self Care | Payer: Medicare Other | Attending: Emergency Medicine | Admitting: Emergency Medicine

## 2016-07-28 ENCOUNTER — Emergency Department (HOSPITAL_COMMUNITY): Payer: Medicare Other

## 2016-07-28 DIAGNOSIS — J441 Chronic obstructive pulmonary disease with (acute) exacerbation: Secondary | ICD-10-CM | POA: Insufficient documentation

## 2016-07-28 DIAGNOSIS — R0602 Shortness of breath: Secondary | ICD-10-CM | POA: Diagnosis present

## 2016-07-28 DIAGNOSIS — Z8551 Personal history of malignant neoplasm of bladder: Secondary | ICD-10-CM | POA: Insufficient documentation

## 2016-07-28 DIAGNOSIS — Z87891 Personal history of nicotine dependence: Secondary | ICD-10-CM | POA: Insufficient documentation

## 2016-07-28 DIAGNOSIS — I10 Essential (primary) hypertension: Secondary | ICD-10-CM | POA: Insufficient documentation

## 2016-07-28 DIAGNOSIS — Z79899 Other long term (current) drug therapy: Secondary | ICD-10-CM | POA: Diagnosis not present

## 2016-07-28 LAB — CBC WITH DIFFERENTIAL/PLATELET
BASOS ABS: 0.2 10*3/uL — AB (ref 0.0–0.1)
BASOS PCT: 2 %
EOS PCT: 19 %
Eosinophils Absolute: 2.1 10*3/uL — ABNORMAL HIGH (ref 0.0–0.7)
HCT: 44.7 % (ref 39.0–52.0)
Hemoglobin: 15.2 g/dL (ref 13.0–17.0)
Lymphocytes Relative: 27 %
Lymphs Abs: 2.9 10*3/uL (ref 0.7–4.0)
MCH: 29.7 pg (ref 26.0–34.0)
MCHC: 34 g/dL (ref 30.0–36.0)
MCV: 87.3 fL (ref 78.0–100.0)
MONO ABS: 1.1 10*3/uL — AB (ref 0.1–1.0)
Monocytes Relative: 10 %
NEUTROS ABS: 4.5 10*3/uL (ref 1.7–7.7)
Neutrophils Relative %: 42 %
PLATELETS: 394 10*3/uL (ref 150–400)
RBC: 5.12 MIL/uL (ref 4.22–5.81)
RDW: 16.5 % — AB (ref 11.5–15.5)
WBC: 10.7 10*3/uL — AB (ref 4.0–10.5)

## 2016-07-28 LAB — COMPREHENSIVE METABOLIC PANEL
ALBUMIN: 4.2 g/dL (ref 3.5–5.0)
ALT: 23 U/L (ref 17–63)
AST: 22 U/L (ref 15–41)
Alkaline Phosphatase: 73 U/L (ref 38–126)
Anion gap: 12 (ref 5–15)
BUN: 14 mg/dL (ref 6–20)
CHLORIDE: 101 mmol/L (ref 101–111)
CO2: 21 mmol/L — AB (ref 22–32)
Calcium: 9.5 mg/dL (ref 8.9–10.3)
Creatinine, Ser: 1.37 mg/dL — ABNORMAL HIGH (ref 0.61–1.24)
GFR calc Af Amer: 58 mL/min — ABNORMAL LOW (ref >60)
GFR calc non Af Amer: 50 mL/min — ABNORMAL LOW (ref >60)
GLUCOSE: 87 mg/dL (ref 65–99)
POTASSIUM: 4.2 mmol/L (ref 3.5–5.1)
Sodium: 134 mmol/L — ABNORMAL LOW (ref 135–145)
Total Bilirubin: 0.5 mg/dL (ref 0.3–1.2)
Total Protein: 6.7 g/dL (ref 6.5–8.1)

## 2016-07-28 MED ORDER — ALBUTEROL (5 MG/ML) CONTINUOUS INHALATION SOLN
10.0000 mg/h | INHALATION_SOLUTION | Freq: Once | RESPIRATORY_TRACT | Status: AC
  Administered 2016-07-29: 10 mg/h via RESPIRATORY_TRACT
  Filled 2016-07-28: qty 20

## 2016-07-28 MED ORDER — PREDNISONE 20 MG PO TABS
60.0000 mg | ORAL_TABLET | Freq: Once | ORAL | Status: AC
  Administered 2016-07-29: 60 mg via ORAL
  Filled 2016-07-28: qty 3

## 2016-07-28 MED ORDER — IPRATROPIUM BROMIDE 0.02 % IN SOLN
0.5000 mg | Freq: Once | RESPIRATORY_TRACT | Status: AC
  Administered 2016-07-29: 0.5 mg via RESPIRATORY_TRACT
  Filled 2016-07-28: qty 2.5

## 2016-07-28 NOTE — ED Triage Notes (Signed)
Pt to ED c/o shortness of breath.  St's he thinks he has a chest cold.  Pt has COPD and st's he can't lay flat at night to sleep

## 2016-07-29 LAB — TROPONIN I: Troponin I: <0.03 ng/mL (ref <0.03)

## 2016-07-29 LAB — BRAIN NATRIURETIC PEPTIDE: B NATRIURETIC PEPTIDE 5: 60.7 pg/mL (ref 0.0–100.0)

## 2016-07-29 MED ORDER — PREDNISONE 10 MG PO TABS: 60.0000 mg | ORAL_TABLET | Freq: Every day | ORAL | 0 refills | Status: DC

## 2016-07-29 MED ORDER — ALBUTEROL SULFATE HFA 108 (90 BASE) MCG/ACT IN AERS: 2.0000 | INHALATION_SPRAY | Freq: Four times a day (QID) | RESPIRATORY_TRACT | 2 refills | Status: DC | PRN

## 2016-07-29 MED ORDER — IPRATROPIUM-ALBUTEROL 0.5-2.5 (3) MG/3ML IN SOLN
3.0000 mL | Freq: Four times a day (QID) | RESPIRATORY_TRACT | Status: DC
  Filled 2016-07-29: qty 3

## 2016-07-29 MED ORDER — ACETAMINOPHEN 325 MG PO TABS
650.0000 mg | ORAL_TABLET | Freq: Once | ORAL | Status: AC
Start: 2016-07-29 — End: 2016-07-29
  Administered 2016-07-29: 650 mg via ORAL
  Filled 2016-07-29: qty 2

## 2016-07-29 NOTE — ED Notes (Signed)
RT on the way to give continues neb tx.

## 2016-07-29 NOTE — Discharge Instructions (Signed)
We saw you in the ER for your breathing related complains. We gave you some breathing treatments in the ER, and seems like your symptoms have improved. Please take albuterol as needed every 4 hours. Please take the medications prescribed.  Please see a primary care doctor in 1 week. Return to the ER if your symptoms worsen.

## 2016-07-29 NOTE — ED Provider Notes (Signed)
[facility] DEPT Provider Note   CSN: [id number] Arrival date & time: 07/28/16  1851     History   Chief Complaint Chief Complaint  Patient presents with  . Shortness of Breath    HPI [name] is a 71 y.o. male.  HPI Pt with hx of COPD and hx of bladder CA, s/p resection, who is now cancer free comes in with cc of DIB. Pt has cough, congestion and mild wheezing. DIB is mostly exertional. Pt reports that his DIB is also worse at night when laying flat. He has no hx of CHF. Pt denies chest pains. Pt is a smoker.  Past Medical History:  Diagnosis Date  . Anxiety   . Arthritis   . Cancer [facility])    Bladder Ca 2013, surgically removed  . COPD (chronic obstructive pulmonary disease) ([place])   . Hypertension   . Shortness of breath     Patient Active Problem List   Diagnosis Date Noted  . COPD (chronic obstructive pulmonary disease) ([place]) 03/17/2014  . COPD exacerbation ([place]) 11/06/2013  . BLADDER CANCER 10/02/2007  . HYPERLIPIDEMIA 10/02/2007  . ALCOHOL ABUSE 10/02/2007  . EROSIVE ESOPHAGITIS 10/02/2007  . ESOPHAGEAL STENOSIS 10/02/2007  . HIATAL HERNIA 10/02/2007  . FATIGUE 10/02/2007  . WOUND, CHEST WALL, WITH COMPLICATION [id number]  . DEPRESSION 12/26/2006  . HYPERTENSION 12/26/2006  . GERD 12/26/2006  . HEADACHE 12/26/2006    Past Surgical History:  Procedure Laterality Date  . BACK SURGERY    . cancer removed         Home Medications    Prior to Admission medications   Medication Sig Start Date End Date Taking? Authorizing Provider  albuterol (PROVENTIL HFA;VENTOLIN HFA) 108 (90 Base) MCG/ACT inhaler Inhale 2 puffs into the lungs every 6 (six) hours as needed for wheezing or shortness of breath. 07/29/16   Varney Biles, MD  ALPRAZolam Duanne Moron) 0.25 MG tablet Take 0.25 mg by mouth daily as needed for anxiety.  07/18/14   Historical Provider, MD  amLODipine (NORVASC) 10 MG tablet Take 10 mg by mouth daily.    Historical Provider, MD    budesonide-formoterol (SYMBICORT) 160-4.5 MCG/ACT inhaler Inhale 2 puffs into the lungs 2 (two) times daily. 12/25/15   Tammy S Parrett, NP  ipratropium-albuterol (DUONEB) 0.5-2.5 (3) MG/3ML SOLN Take 3 mLs by nebulization every 6 (six) hours as needed. For shortness of breath 11/07/13   Melton Alar, PA-C  pravastatin (PRAVACHOL) 10 MG tablet Take 1 tablet by mouth daily. 10/03/14   Historical Provider, MD  predniSONE (DELTASONE) 10 MG tablet Take 6 tablets (60 mg total) by mouth daily. 07/29/16   Lima Chillemi Kathrynn Humble, MD  tiotropium (SPIRIVA HANDIHALER) 18 MCG inhalation capsule PLACE 1 CAPSULE INTO INHALER& INHALE EVERY DAY 02/12/16   Tammy S Parrett, NP  valsartan (DIOVAN) 160 MG tablet Take 160 mg by mouth daily.      Historical Provider, MD    Family History Family History  Problem Relation Age of Onset  . Cancer Brother   . Cancer Father     Social History Social History  Substance Use Topics  . Smoking status: Former Smoker    Packs/day: 1.00    Years: 50.00    Types: Cigarettes    Quit date: 09/21/2013  . Smokeless tobacco: Not on file  . Alcohol use No     Allergies   Bactrim [sulfamethoxazole-trimethoprim]   Review of Systems Review of Systems  ROS 10 Systems reviewed and are negative for acute change  except as noted in the HPI.     Physical Exam Updated Vital Signs BP 171/99   Pulse 104   Temp 98.3 F (36.8 C) (Oral)   Resp 18   Ht 5\' 6"  (1.676 m)   Wt 162 lb (73.5 kg)   SpO2 99%   BMI 26.15 kg/m   Physical Exam  Constitutional: He is oriented to person, place, and time. He appears well-developed.  HENT:  Head: Normocephalic and atraumatic.  Eyes: Conjunctivae and EOM are normal. Pupils are equal, round, and reactive to light.  Neck: Normal range of motion. Neck supple.  Cardiovascular: Normal rate and regular rhythm.   Pulmonary/Chest: Effort normal. No respiratory distress. He has wheezes.  Abdominal: Soft. Bowel sounds are normal. He exhibits no  distension and no mass. There is no tenderness. There is no rebound and no guarding.  Musculoskeletal: He exhibits no deformity.  Neurological: He is alert and oriented to person, place, and time.  Skin: Skin is warm.  Nursing note and vitals reviewed.    ED Treatments / Results  Labs (all labs ordered are listed, but only abnormal results are displayed) Labs Reviewed  CBC WITH DIFFERENTIAL/PLATELET - Abnormal; Notable for the following:       Result Value   WBC 10.7 (*)    RDW 16.5 (*)    Monocytes Absolute 1.1 (*)    Eosinophils Absolute 2.1 (*)    Basophils Absolute 0.2 (*)    All other components within normal limits  COMPREHENSIVE METABOLIC PANEL - Abnormal; Notable for the following:    Sodium 134 (*)    CO2 21 (*)    Creatinine, Ser 1.37 (*)    GFR calc non Af Amer 50 (*)    GFR calc Af Amer 58 (*)    All other components within normal limits  TROPONIN I  BRAIN NATRIURETIC PEPTIDE    EKG  EKG Interpretation None       Radiology Dg Chest 2 View  Result Date: 07/28/2016 CLINICAL DATA:  Productive cough for 1 week.  Mid chest pain. EXAM: CHEST  2 VIEW COMPARISON:  PA and lateral chest 12/11/2015 and 11/17/2015. FINDINGS: Lungs are clear. Mild elevation of the left hemidiaphragm is unchanged. No pneumothorax or pleural effusion. Heart size is normal. No acute bony abnormality. IMPRESSION: No acute disease. Electronically Signed   By: Inge Rise M.D.   On: 07/28/2016 20:53    Procedures Procedures (including critical care time)  Medications Ordered in ED Medications  albuterol (PROVENTIL,VENTOLIN) solution continuous neb (10 mg/hr Nebulization Given 07/29/16 0013)  ipratropium (ATROVENT) nebulizer solution 0.5 mg (0.5 mg Nebulization Given 07/29/16 0013)  predniSONE (DELTASONE) tablet 60 mg (60 mg Oral Given 07/29/16 0004)  acetaminophen (TYLENOL) tablet 650 mg (650 mg Oral Given 07/29/16 0046)     Initial Impression / Assessment and Plan / ED Course  I  have reviewed the triage vital signs and the nursing notes.  Pertinent labs & imaging results that were available during my care of the patient were reviewed by me and considered in my medical decision making (see chart for details).  Clinical Course as of Jul 30 1528  Tue Jul 29, 2016  0059 Repeat exam reveals clearing of wheezing in all lung fields. Patient is not in any respiratory distress nor is there hypoxia.   [AN]    Clinical Course User Index [AN] Varney Biles, MD   Pt has COPD exacerbation - mild. No infection like symptoms. BNP ordered.  Final Clinical  Impressions(s) / ED Diagnoses   Final diagnoses:  COPD with acute exacerbation [facility])    New Prescriptions Discharge Medication List as of 07/29/2016 12:59 AM       Varney Biles, MD 07/30/16 1539

## 2016-10-15 ENCOUNTER — Other Ambulatory Visit: Payer: Self-pay | Admitting: Adult Health

## 2016-11-12 ENCOUNTER — Ambulatory Visit: Payer: Medicare Other | Admitting: Podiatry

## 2017-03-16 ENCOUNTER — Other Ambulatory Visit: Payer: Self-pay | Admitting: Adult Health

## 2017-03-24 ENCOUNTER — Encounter (HOSPITAL_COMMUNITY): Payer: Self-pay | Admitting: Emergency Medicine

## 2017-03-24 ENCOUNTER — Ambulatory Visit (HOSPITAL_COMMUNITY)
Admission: EM | Admit: 2017-03-24 | Discharge: 2017-03-24 | Disposition: A | Discharge status: Home / Self Care | Payer: Medicare Other | Attending: Family Medicine | Admitting: Family Medicine

## 2017-03-24 DIAGNOSIS — I10 Essential (primary) hypertension: Secondary | ICD-10-CM

## 2017-03-24 DIAGNOSIS — J0110 Acute frontal sinusitis, unspecified: Secondary | ICD-10-CM

## 2017-03-24 DIAGNOSIS — J440 Chronic obstructive pulmonary disease with acute lower respiratory infection: Secondary | ICD-10-CM | POA: Diagnosis not present

## 2017-03-24 DIAGNOSIS — J441 Chronic obstructive pulmonary disease with (acute) exacerbation: Secondary | ICD-10-CM

## 2017-03-24 MED ORDER — IPRATROPIUM-ALBUTEROL 0.5-2.5 (3) MG/3ML IN SOLN: 3.0000 mL | Freq: Four times a day (QID) | RESPIRATORY_TRACT | 0 refills | Status: DC | PRN

## 2017-03-24 MED ORDER — PREDNISONE 50 MG PO TABS: 50.0000 mg | ORAL_TABLET | Freq: Every day | ORAL | 0 refills | Status: AC

## 2017-03-24 MED ORDER — AMOXICILLIN-POT CLAVULANATE 875-125 MG PO TABS: 1.0000 | ORAL_TABLET | Freq: Two times a day (BID) | ORAL | 0 refills | Status: AC

## 2017-03-24 MED ORDER — ALBUTEROL SULFATE HFA 108 (90 BASE) MCG/ACT IN AERS: 2.0000 | INHALATION_SPRAY | Freq: Four times a day (QID) | RESPIRATORY_TRACT | 0 refills | Status: DC | PRN

## 2017-03-24 MED ORDER — BENZONATATE 100 MG PO CAPS: 100.0000 mg | ORAL_CAPSULE | Freq: Three times a day (TID) | ORAL | 0 refills | Status: DC | PRN

## 2017-03-24 MED ORDER — AMLODIPINE BESYLATE 10 MG PO TABS: 10.0000 mg | ORAL_TABLET | Freq: Every day | ORAL | 0 refills | Status: DC

## 2017-03-24 NOTE — ED Triage Notes (Addendum)
Patient says he has a diagnosis of copd.  Patient says cold symptoms started one week ago.  Patient soreness in chest and head that increases with coughing .  Reports clear phlegm if any production  Cough worsens with lying down.  Unable to sleep last night due to coughing.  Patient is out of the majority of medicines.  "i dont like doctors"

## 2017-03-24 NOTE — Discharge Instructions (Addendum)
Recommend start Augmentin 875mg  twice a day as directed. Start Prednisone 50mg  daily for 5 days then stop. May use Tessalon cough pills - 1 every 8 hours as needed. Use DuoNeb every 6 hours as needed for cough/shortness of breath. Or may use Albuterol 2 puffs every 6 hours as needed. Restart amlodipine blood pressure medication once daily. Recommend follow-up with your PCP next week for recheck and continued medication management or go to ER if symptoms worsen.

## 2017-03-25 NOTE — ED Provider Notes (Signed)
[place]    CSN: [id number] Arrival date & time: 03/24/17  1005     History   Chief Complaint Chief Complaint  Patient presents with  . URI    HPI [name] is a 71 y.o. male.   71 year old male presents with nasal congestion, cough and chest congestion for over 1 week. Also having a headache and chest soreness but denies any fever or GI symptoms. Has history of COPD and ran out of all his medication. Also has history of HTN and has not taken any medication in over 1 month. Patient indicates that Valsartan was "recalled' so uncertain what BP medication he was previous prescribed. Tends to get bronchitis and flare up of COPD symptoms about once to twice a year. Has not taken any OTC medication for current symptoms. Unable to sleep due to cough.    The history is provided by the patient.    Past Medical History:  Diagnosis Date  . Anxiety   . Arthritis   . Cancer [facility])    Bladder Ca 2013, surgically removed  . COPD (chronic obstructive pulmonary disease) ([place])   . Hypertension   . Shortness of breath     Patient Active Problem List   Diagnosis Date Noted  . COPD (chronic obstructive pulmonary disease) ([place]) 03/17/2014  . COPD exacerbation ([place]) 11/06/2013  . BLADDER CANCER 10/02/2007  . HYPERLIPIDEMIA 10/02/2007  . ALCOHOL ABUSE 10/02/2007  . EROSIVE ESOPHAGITIS 10/02/2007  . ESOPHAGEAL STENOSIS 10/02/2007  . HIATAL HERNIA 10/02/2007  . FATIGUE 10/02/2007  . WOUND, CHEST WALL, WITH COMPLICATION 02/72/5366  . DEPRESSION 12/26/2006  . HYPERTENSION 12/26/2006  . GERD 12/26/2006  . HEADACHE 12/26/2006    Past Surgical History:  Procedure Laterality Date  . BACK SURGERY    . cancer removed         Home Medications    Prior to Admission medications   Medication Sig Start Date End Date Taking? Authorizing Provider  albuterol (PROVENTIL HFA;VENTOLIN HFA) 108 (90 Base) MCG/ACT inhaler Inhale 2 puffs into the lungs every 6 (six) hours as  needed for wheezing or shortness of breath. 03/24/17   Katy Apo, NP  amLODipine (NORVASC) 10 MG tablet Take 1 tablet (10 mg total) by mouth daily. 03/24/17   Katy Apo, NP  amoxicillin-clavulanate (AUGMENTIN) 875-125 MG tablet Take 1 tablet by mouth every 12 (twelve) hours. 03/24/17 03/31/17  Katy Apo, NP  benzonatate (TESSALON) 100 MG capsule Take 1 capsule (100 mg total) by mouth 3 (three) times daily as needed for cough. 03/24/17   Katy Apo, NP  ipratropium-albuterol (DUONEB) 0.5-2.5 (3) MG/3ML SOLN Take 3 mLs by nebulization every 6 (six) hours as needed. For shortness of breath 03/24/17   Katy Apo, NP  predniSONE (DELTASONE) 50 MG tablet Take 1 tablet (50 mg total) by mouth daily. 03/24/17 03/29/17  Katy Apo, NP    Family History Family History  Problem Relation Age of Onset  . Cancer Brother   . Cancer Father     Social History Social History  Substance Use Topics  . Smoking status: Former Smoker    Packs/day: 1.00    Years: 50.00    Types: Cigarettes    Quit date: 09/21/2013  . Smokeless tobacco: Not on file  . Alcohol use No     Allergies   Patient has no active allergies.   Review of Systems Review of Systems  Constitutional: Positive for fatigue. Negative for activity  change, appetite change, chills and fever.  HENT: Positive for congestion, postnasal drip and sinus pressure. Negative for ear discharge, ear pain, mouth sores, nosebleeds, rhinorrhea, sinus pain, sneezing, sore throat and trouble swallowing.   Eyes: Negative for pain, discharge, redness and itching.  Respiratory: Positive for cough, chest tightness, shortness of breath and wheezing.   Cardiovascular: Negative for chest pain, palpitations and leg swelling.  Gastrointestinal: Negative for abdominal pain, diarrhea, nausea and vomiting.  Musculoskeletal: Negative for arthralgias, myalgias, neck pain and neck stiffness.  Skin: Negative for color change, rash  and wound.  Neurological: Positive for headaches. Negative for dizziness, tremors, seizures, syncope, weakness, light-headedness and numbness.  Hematological: Negative for adenopathy. Does not bruise/bleed easily.  Psychiatric/Behavioral: Negative.      Physical Exam Triage Vital Signs ED Triage Vitals [03/24/17 1037]  Enc Vitals Group     BP (!) 183/98     Pulse Rate 80     Resp 20     Temp 97.9 F (36.6 C)     Temp Source Oral     SpO2 95 %     Weight      Height      Head Circumference      Peak Flow      Pain Score      Pain Loc      Pain Edu?      Excl. in [place]?    No data found.   Updated Vital Signs BP (!) 183/98 (BP Location: Left Arm)   Pulse 80   Temp 97.9 F (36.6 C) (Oral)   Resp 20   SpO2 95%   Visual Acuity Right Eye Distance:   Left Eye Distance:   Bilateral Distance:    Right Eye Near:   Left Eye Near:    Bilateral Near:     Physical Exam  Constitutional: He is oriented to person, place, and time. He appears well-developed and well-nourished. He is cooperative. No distress.  Patient sitting quietly on exam table in no acute distress.   HENT:  Head: Normocephalic and atraumatic.  Right Ear: Hearing, tympanic membrane, external ear and ear canal normal.  Left Ear: Hearing, tympanic membrane, external ear and ear canal normal.  Nose: Mucosal edema and rhinorrhea present. Right sinus exhibits frontal sinus tenderness. Right sinus exhibits no maxillary sinus tenderness. Left sinus exhibits frontal sinus tenderness. Left sinus exhibits no maxillary sinus tenderness.  Mouth/Throat: Uvula is midline and mucous membranes are normal. Posterior oropharyngeal erythema present.  Eyes: Conjunctivae and EOM are normal.  Neck: Normal range of motion. Neck supple.  Cardiovascular: Normal rate, regular rhythm and normal heart sounds.   No murmur heard. Pulmonary/Chest: Effort normal. No respiratory distress. He has decreased breath sounds in the right upper  field, the right lower field, the left upper field and the left lower field. He has wheezes in the right upper field, the right lower field, the left upper field and the left lower field. He has rhonchi in the right upper field and the left upper field. He has no rales.  Musculoskeletal: Normal range of motion.  Lymphadenopathy:    He has no cervical adenopathy.  Neurological: He is alert and oriented to person, place, and time. He has normal strength. No sensory deficit.  Skin: Skin is warm and dry. Capillary refill takes less than 2 seconds. No rash noted. No erythema.  Psychiatric: His speech is normal. Judgment and thought content normal. He is slowed. Cognition and memory are normal.  Slightly  flat affect     UC Treatments / Results  Labs (all labs ordered are listed, but only abnormal results are displayed) Labs Reviewed - No data to display  EKG  EKG Interpretation None       Radiology No results found.  Procedures Procedures (including critical care time)  Medications Ordered in UC Medications - No data to display   Initial Impression / Assessment and Plan / UC Course  I have reviewed the triage vital signs and the nursing notes.  Pertinent labs & imaging results that were available during my care of the patient were reviewed by me and considered in my medical decision making (see chart for details).    Discussed that he has a mild sinus infection and bronchitis with a flare up of his COPD. Reviewed that he needs to restart blood pressure medication since other medication prescribed (prednisone) can increase blood pressure even more. Will restart amlodipine once daily- Rx for 30 days provided. Continue to monitor BP. Recommend start Augmentin 875mg  twice a day as directed. Start Prednisone 50mg  daily for 5 days then stop. May use Tessalon cough pills - 1 every 8 hours as needed. Use DuoNeb every 6 hours as needed for cough/shortness of breath. Or may use Albuterol  inhaler 2 puffs every 6 hours as needed. May need to restart other inhalers for COPD- need to see PCP. Recommend follow-up with his PCP next week for recheck and continued medication management or go to ER if symptoms worsen.    Final Clinical Impressions(s) / UC Diagnoses   Final diagnoses:  COPD exacerbation ([place])  Bronchitis, chronic obstructive w acute bronchitis ([place])  Acute non-recurrent frontal sinusitis  Essential hypertension    New Prescriptions Discharge Medication List as of 03/24/2017 11:31 AM    START taking these medications   Details  amoxicillin-clavulanate (AUGMENTIN) 875-125 MG tablet Take 1 tablet by mouth every 12 (twelve) hours., Starting Tue 03/24/2017, Until Tue 03/31/2017, Normal    benzonatate (TESSALON) 100 MG capsule Take 1 capsule (100 mg total) by mouth 3 (three) times daily as needed for cough., Starting Tue 03/24/2017, Normal         Controlled Substance Prescriptions [place] Controlled Substance Registry consulted? Not Applicable   Katy Apo, NP 03/25/17 [phone]

## 2017-04-07 ENCOUNTER — Ambulatory Visit (INDEPENDENT_AMBULATORY_CARE_PROVIDER_SITE_OTHER): Payer: Medicare Other | Admitting: Pulmonary Disease

## 2017-04-07 ENCOUNTER — Encounter: Payer: Self-pay | Admitting: Pulmonary Disease

## 2017-04-07 ENCOUNTER — Ambulatory Visit (INDEPENDENT_AMBULATORY_CARE_PROVIDER_SITE_OTHER)
Admission: RE | Admit: 2017-04-07 | Discharge: 2017-04-07 | Disposition: A | Discharge status: Home / Self Care | Payer: Medicare Other | Source: Ambulatory Visit | Attending: Pulmonary Disease | Admitting: Pulmonary Disease

## 2017-04-07 ENCOUNTER — Other Ambulatory Visit: Payer: Self-pay | Admitting: Pulmonary Disease

## 2017-04-07 DIAGNOSIS — J441 Chronic obstructive pulmonary disease with (acute) exacerbation: Secondary | ICD-10-CM | POA: Diagnosis not present

## 2017-04-07 DIAGNOSIS — J449 Chronic obstructive pulmonary disease, unspecified: Secondary | ICD-10-CM | POA: Diagnosis not present

## 2017-04-07 MED ORDER — IPRATROPIUM-ALBUTEROL 0.5-2.5 (3) MG/3ML IN SOLN: 3.0000 mL | Freq: Four times a day (QID) | RESPIRATORY_TRACT | 3 refills | Status: AC | PRN

## 2017-04-07 MED ORDER — UMECLIDINIUM-VILANTEROL 62.5-25 MCG/INH IN AEPB: 1.0000 | INHALATION_SPRAY | Freq: Every day | RESPIRATORY_TRACT | 0 refills | Status: DC

## 2017-04-07 MED ORDER — PREDNISONE 10 MG PO TABS: ORAL_TABLET | ORAL | 0 refills | Status: DC

## 2017-04-07 MED ORDER — AZITHROMYCIN 250 MG PO TABS: ORAL_TABLET | ORAL | 0 refills | Status: DC

## 2017-04-07 NOTE — Progress Notes (Signed)
   Subjective:    Patient ID: [name], male    DOB: May 31, 1946, 71 y.o.   MRN: [id number]  HPI  71 yo male former .Smoker Presents for FU of COPD.  He smoked -about 40 pack years  Urgent care visit 03/24/17 -felt to have COPD flare due to sinus infection and bronchitis, given prednisone and Augmentin started as a head cold And then developed into cough with wheezing and increased shortness of breath.  Unfortunately treatment given by urgent care did not Help him much he is still unable to lie down flat and reports periodic wheezing and shortness of breath.  Sputum is mostly turned white, he denies fevers  He has been using duo nebs every 6 hours and is running out of this.  He had stopped taking the Spiriva over the last year  Significant tests/ events reviewed  CT chest  10/2006   Spirometry  showed FEV1 43% - 1.31 - with ratio 53  Post bronchodilator, FEV1 improved to 1.56 -significant bronchodilator response.   Spirometry 10/2014-FEV1 69%   Past Medical History:  Diagnosis Date  . Anxiety   . Arthritis   . Cancer [facility])    Bladder Ca 2013, surgically removed  . COPD (chronic obstructive pulmonary disease) ([place])   . Hypertension   . Shortness of breath      Review of Systems neg for any significant sore throat, dysphagia, itching, sneezing, nasal congestion or excess/ purulent secretions, fever, chills, sweats, unintended wt loss, pleuritic or exertional cp, hempoptysis, orthopnea pnd or change in chronic leg swelling. Also denies presyncope, palpitations, heartburn, abdominal pain, nausea, vomiting, diarrhea or change in bowel or urinary habits, dysuria,hematuria, rash, arthralgias, visual complaints, headache, numbness weakness or ataxia.     Objective:   Physical Exam   Gen. Pleasant, well-nourished, in no distress ENT - no thrush, no post nasal drip Neck: No JVD, no thyromegaly, no carotid bruits Lungs: no use of accessory muscles, no dullness to percussion,  decreased BL ,norales Bl scattered rhonchi  Cardiovascular: Rhythm regular, heart sounds  normal, no murmurs or gallops, no peripheral edema Musculoskeletal: No deformities, no cyanosis or clubbing         Assessment & Plan:

## 2017-04-07 NOTE — Addendum Note (Signed)
Addended by: Valerie Salts on: 04/07/2017 04:39 PM   Modules accepted: Orders

## 2017-04-07 NOTE — Assessment & Plan Note (Signed)
Refills on Duonebs  Start ANORO once daily(SAMPLE) - instead of spiriva Call for rx if this works

## 2017-04-07 NOTE — Assessment & Plan Note (Signed)
Slow to resolve COPD exacerbation related to acute bronchitis  CXR today  Prednisone 10 mg tabs Take 4 tabs  daily with food x 4 days, then 3 tabs daily x 4 days, then 2 tabs daily x 4 days, then 1 tab daily x4 days then stop. #40  Zpak

## 2017-04-07 NOTE — Patient Instructions (Signed)
CXR today  Prednisone 10 mg tabs Take 4 tabs  daily with food x 4 days, then 3 tabs daily x 4 days, then 2 tabs daily x 4 days, then 1 tab daily x4 days then stop. #40  Zpak   Refills on Duonebs  Start ANORO once daily(SAMPLE) - instead of spiriva Call for rx if this works

## 2017-04-08 ENCOUNTER — Telehealth: Payer: Self-pay | Admitting: Pulmonary Disease

## 2017-04-08 NOTE — Telephone Encounter (Signed)
Notes recorded by Rigoberto Noel, MD on 04/08/2017 at 9:14 AM EDT No pneumonia Changes of COPD as before  Advised pt of results. Pt understood and nothing further is needed.

## 2017-05-15 ENCOUNTER — Telehealth: Payer: Self-pay | Admitting: Pulmonary Disease

## 2017-05-15 MED ORDER — PREDNISONE 10 MG PO TABS: ORAL_TABLET | ORAL | 0 refills | Status: DC

## 2017-05-15 MED ORDER — AZITHROMYCIN 250 MG PO TABS: 250.0000 mg | ORAL_TABLET | Freq: Every day | ORAL | 0 refills | Status: DC

## 2017-05-15 NOTE — Telephone Encounter (Signed)
zpak Prednisone 10 mg tabs  Take 2 tabs daily with food x 5ds, then 1 tab daily with food x 5ds then STOP  Cough syrup OTC such as delsym 51ml bid

## 2017-05-15 NOTE — Telephone Encounter (Signed)
Spoke with pt's wife telling her that RA has approved sending two Rx in to their pharmacy. Also stated to her that they can get delsym cough syrup for pt to do 70ml bid.  [name] expressed understanding. Have sent Rx to pt's preferred pharmacy.nothing further needed.

## 2017-05-15 NOTE — Telephone Encounter (Signed)
Called and spoke with pts wife and she stated that the pt has been coughing for about 2-3 days.  Clear sputum that he is getting up but lots of congestion.  Denies any fever, chills or sweats. He did say that he is SOB when lying down and the cough gets worse then.  Requesting that something be called in to help him over the weekend.  RA please advise. Thanks  No Active Allergies

## 2017-06-10 ENCOUNTER — Telehealth: Payer: Self-pay | Admitting: Pulmonary Disease

## 2017-06-10 MED ORDER — PREDNISONE 10 MG PO TABS: ORAL_TABLET | ORAL | 0 refills | Status: DC

## 2017-06-10 MED ORDER — AMOXICILLIN-POT CLAVULANATE 875-125 MG PO TABS: 1.0000 | ORAL_TABLET | Freq: Two times a day (BID) | ORAL | 0 refills | Status: DC

## 2017-06-10 NOTE — Telephone Encounter (Signed)
Spoke with pt's spouse, [name]  She states pt is having increased cough and SOB for the past 1-2 wks  He is coughing up large amounts of yellow sputum  Cough is worse at night and he is SOB lying down so he has been sleeping in his recliner  He has also been c/o wheezing  No f/c/s or other co's  He has appt with RA pending 07/08/17  No appts today available  Please advise, thanks! No Active Allergies

## 2017-06-10 NOTE — Telephone Encounter (Signed)
Augmentin 875 twice daily for 7 days Prednisone 10 mg tabs  Take 2 tabs daily with food x 5ds, then 1 tab daily with food x 5ds then STOP Robitussin over-the-counter as needed

## 2017-06-10 NOTE — Telephone Encounter (Signed)
Spoke with pt's wife, [name]. She is aware of RA's recommendations. Rxs have been sent in. Nothing further was needed.

## 2017-06-30 ENCOUNTER — Telehealth: Payer: Self-pay | Admitting: Pulmonary Disease

## 2017-06-30 MED ORDER — PREDNISONE 10 MG PO TABS: ORAL_TABLET | ORAL | 0 refills | Status: DC

## 2017-06-30 MED ORDER — AMOXICILLIN-POT CLAVULANATE 875-125 MG PO TABS: 1.0000 | ORAL_TABLET | Freq: Two times a day (BID) | ORAL | 0 refills | Status: DC

## 2017-06-30 NOTE — Telephone Encounter (Signed)
augmentin 875 bid x 7ds mucinex 600 bid Prednisone 10 mg tabs  Take 2 tabs daily with food x 5ds, then 1 tab daily with food x 5ds then STOP  OV if no better

## 2017-06-30 NOTE — Telephone Encounter (Signed)
Spoke with patient. He is aware of RA's recs. Will go ahead and call in medications.

## 2017-06-30 NOTE — Telephone Encounter (Signed)
Spoke with patient since "Arbie Cookey" is not listed on the DPR we have.   Patient stated he has not been feeling well since Wednesday of last week. Productive cough with clearish, yellowish mucus. Increased chest tightness. Increased fatigue.   He wishes to use [facility] on [address].   RA, please advise. Thanks!

## 2017-07-08 ENCOUNTER — Ambulatory Visit: Payer: Medicare Other | Admitting: Pulmonary Disease

## 2017-07-09 ENCOUNTER — Encounter: Payer: Self-pay | Admitting: Pulmonary Disease

## 2017-07-09 ENCOUNTER — Ambulatory Visit: Payer: Medicare Other | Admitting: Pulmonary Disease

## 2017-07-09 DIAGNOSIS — R0982 Postnasal drip: Secondary | ICD-10-CM | POA: Diagnosis not present

## 2017-07-09 DIAGNOSIS — J449 Chronic obstructive pulmonary disease, unspecified: Secondary | ICD-10-CM

## 2017-07-09 DIAGNOSIS — J441 Chronic obstructive pulmonary disease with (acute) exacerbation: Secondary | ICD-10-CM

## 2017-07-09 NOTE — Assessment & Plan Note (Signed)
CT scan of the sinuses to see why you keep getting head colds.  After you are done with Spiriva, call us for prescription for different medication- ANORO  We will also consider triple therapy in the future  please bring all your medications with you for your next visit

## 2017-07-09 NOTE — Patient Instructions (Signed)
CT scan of the sinuses to see why you keep getting head colds.  After you are done with Spiriva, call us for prescription for different medication- ANORO   please bring all your medications with you for your next visit

## 2017-07-09 NOTE — Progress Notes (Signed)
   Subjective:    Patient ID: [name], male    DOB: 24-Aug-1945, 72 y.o.   MRN: [id number]  HPI 72 yo ex-smoker Presents for FU of COPD.  He smoked -about 40 pack years  He has 3 episodes of head cold turning into a chest cold over the last 2 months each time requiring antibiotics and short course of prednisone to get him better.  He wonders why he keeps getting repeated head cold.  He is not a people person and does not go out much.  It is difficult to tell which medication he is taking but seems to be on Spiriva,ANORO sample was given in the past but seems like he use this and never really got a prescription to continue this  He states that he has not been allergy tested in the past which was all negative.  He has a dog at home.  Environmental history otherwise does not show any triggers for his repeated goals   Significant tests/ events reviewed  CT chest  10/2006   Spirometry  showed FEV1 43% - 1.31 - with ratio 53  Post bronchodilator, FEV1 improved to 1.56 -significant bronchodilator response.   Spirometry 10/2014-FEV1 69%   Review of Systems neg for any significant sore throat, dysphagia, itching, sneezing, nasal congestion or excess/ purulent secretions, fever, chills, sweats, unintended wt loss, pleuritic or exertional cp, hempoptysis, orthopnea pnd or change in chronic leg swelling.  Also denies presyncope, palpitations, heartburn, abdominal pain, nausea, vomiting, diarrhea or change in bowel or urinary habits, dysuria,hematuria, rash, arthralgias, visual complaints, headache, numbness weakness or ataxia.     Objective:   Physical Exam   Gen. Pleasant, well-nourished, in no distress ENT - no thrush, no post nasal drip Neck: No JVD, no thyromegaly, no carotid bruits Lungs: no use of accessory muscles, no dullness to percussion, decreased BL without rales or rhonchi  Cardiovascular: Rhythm regular, heart sounds  normal, no murmurs or gallops, no peripheral  edema Musculoskeletal: No deformities, no cyanosis or clubbing         Assessment & Plan:

## 2017-07-09 NOTE — Assessment & Plan Note (Signed)
Improved with current round of Augmentin and prednisone

## 2017-07-20 ENCOUNTER — Ambulatory Visit (INDEPENDENT_AMBULATORY_CARE_PROVIDER_SITE_OTHER)
Admission: RE | Admit: 2017-07-20 | Discharge: 2017-07-20 | Disposition: A | Discharge status: Home / Self Care | Payer: Medicare Other | Source: Ambulatory Visit | Attending: Pulmonary Disease | Admitting: Pulmonary Disease

## 2017-07-20 DIAGNOSIS — R0982 Postnasal drip: Secondary | ICD-10-CM

## 2017-07-27 ENCOUNTER — Telehealth: Payer: Self-pay | Admitting: Pulmonary Disease

## 2017-07-27 MED ORDER — DOXYCYCLINE HYCLATE 100 MG PO TABS: 100.0000 mg | ORAL_TABLET | Freq: Two times a day (BID) | ORAL | 0 refills | Status: DC

## 2017-07-27 MED ORDER — PREDNISONE 10 MG PO TABS: ORAL_TABLET | ORAL | 0 refills | Status: DC

## 2017-07-27 NOTE — Telephone Encounter (Signed)
Spoke with both patient and his wife. He stated that he has been feeling sick for the past week. He has a productive cough with yellowish, greenish phlegm. Denied any body aches, but did state that his chest is now sore from coughing so hard. Denied any fevers. Increased fatigue, wheezing (when attempting to lay down in bed), and SOB with daily activities.   Patient wishes to use [facility] on [address].   MR, please advise since RA is not in the office today. Thanks!

## 2017-07-27 NOTE — Telephone Encounter (Signed)
  ASSESSMENT:''  AECOPD   No Active Allergies   PLAN Take doxycycline 100mg  po twice daily x 5 days; take after meals and avoid sunlight  Please take prednisone 40 mg x1 day, then 30 mg x1 day, then 20 mg x1 day, then 10 mg x1 day, and then 5 mg x1 day and stop   Dr. Brand Males, M.D., [facility].C.P Pulmonary and Critical Care Medicine Staff Physician, [place] Director - Interstitial Lung Disease  Program  Pulmonary [place] [facility] [place], [place], [address]  Pager: [phone], If no answer or between  15:00h - 7:00h: call [phone] Telephone: [phone]

## 2017-07-27 NOTE — Telephone Encounter (Signed)
Spoke with pt's wife. She is aware of MR's recommendations. Rxs have been sent in. Nothing further was needed.

## 2017-08-10 ENCOUNTER — Telehealth: Payer: Self-pay | Admitting: Pulmonary Disease

## 2017-08-10 MED ORDER — PREDNISONE 10 MG PO TABS: ORAL_TABLET | ORAL | 0 refills | Status: DC

## 2017-08-10 NOTE — Telephone Encounter (Signed)
Spoke with pt's spouse, aware of recs.  Prednisone called in to last until appt on Friday with TP.  Nothing further needed.

## 2017-08-10 NOTE — Telephone Encounter (Signed)
He has been having recurrent sickness. Please sleep on his appointment with TP for the next week or 2 Will provide extended prednisone taper until appointment  Prednisone 10 mg tabs  Take 2 tabs daily with food x 5ds, then 1 tab daily with food until appt

## 2017-08-10 NOTE — Telephone Encounter (Signed)
Called pt and spoke with pt's wife Arbie Cookey who stated pt has been having difficulty breathing, wheezing, and productive cough x1 week.  Pt denies any fever, body aches, or chills.  Dr. Elsworth Soho, please advise on recommendations for pt. Thanks!

## 2017-08-14 ENCOUNTER — Encounter: Payer: Self-pay | Admitting: Adult Health

## 2017-08-14 ENCOUNTER — Ambulatory Visit: Payer: Medicare Other | Admitting: Adult Health

## 2017-08-14 ENCOUNTER — Other Ambulatory Visit (INDEPENDENT_AMBULATORY_CARE_PROVIDER_SITE_OTHER): Payer: Medicare Other

## 2017-08-14 VITALS — BP 132/74 | HR 65 | Ht 67.0 in | Wt 171.8 lb

## 2017-08-14 DIAGNOSIS — J310 Chronic rhinitis: Secondary | ICD-10-CM | POA: Insufficient documentation

## 2017-08-14 DIAGNOSIS — J441 Chronic obstructive pulmonary disease with (acute) exacerbation: Secondary | ICD-10-CM | POA: Diagnosis not present

## 2017-08-14 DIAGNOSIS — J4541 Moderate persistent asthma with (acute) exacerbation: Secondary | ICD-10-CM

## 2017-08-14 DIAGNOSIS — J449 Chronic obstructive pulmonary disease, unspecified: Secondary | ICD-10-CM | POA: Diagnosis not present

## 2017-08-14 DIAGNOSIS — J309 Allergic rhinitis, unspecified: Secondary | ICD-10-CM

## 2017-08-14 DIAGNOSIS — J019 Acute sinusitis, unspecified: Secondary | ICD-10-CM | POA: Insufficient documentation

## 2017-08-14 LAB — CBC WITH DIFFERENTIAL/PLATELET
BASOS PCT: 0.8 % (ref 0.0–3.0)
Basophils Absolute: 0.1 10*3/uL (ref 0.0–0.1)
EOS PCT: 0.2 % (ref 0.0–5.0)
Eosinophils Absolute: 0 10*3/uL (ref 0.0–0.7)
HEMATOCRIT: 45.7 % (ref 39.0–52.0)
HEMOGLOBIN: 15.4 g/dL (ref 13.0–17.0)
LYMPHS PCT: 8.4 % — AB (ref 12.0–46.0)
Lymphs Abs: 1.1 10*3/uL (ref 0.7–4.0)
MCHC: 33.7 g/dL (ref 30.0–36.0)
MCV: 90.5 fl (ref 78.0–100.0)
MONO ABS: 0.3 10*3/uL (ref 0.1–1.0)
MONOS PCT: 2.3 % — AB (ref 3.0–12.0)
Neutro Abs: 11 10*3/uL — ABNORMAL HIGH (ref 1.4–7.7)
Neutrophils Relative %: 88.3 % — ABNORMAL HIGH (ref 43.0–77.0)
Platelets: 412 10*3/uL — ABNORMAL HIGH (ref 150.0–400.0)
RBC: 5.05 Mil/uL (ref 4.22–5.81)
RDW: 15.7 % — AB (ref 11.5–15.5)
WBC: 12.5 10*3/uL — AB (ref 4.0–10.5)

## 2017-08-14 MED ORDER — FLUTICASONE-UMECLIDIN-VILANT 100-62.5-25 MCG/INH IN AEPB: 1.0000 | INHALATION_SPRAY | Freq: Every day | RESPIRATORY_TRACT | 0 refills | Status: DC

## 2017-08-14 NOTE — Addendum Note (Signed)
Addended by: Elton Sin on: 08/14/2017 03:04 PM   Modules accepted: Orders

## 2017-08-14 NOTE — Progress Notes (Signed)
@Patient  ID: [name], male    DOB: 20-Dec-1945, 72 y.o.   MRN: [id number]  Chief Complaint  Patient presents with  . Acute Visit    COPD     Referring provider: Nolene Ebbs, MD  HPI: 72 yo male former .Smoker Presents for FU of COPD.   TEST  CRX 5/31 & 6/9 reviewed -hyperinflation, no obvious infx  CT chest from 10/2006 reviewed  Spirometry showed FEV1 43% - 1.31 - with ratio 53  Post bronchodilator, FEV1 improved to 1.56 -significant bronchodilator response.  He smoked a pack a day since the age of 15-about 40 pack years  08/14/2017 Acute OV : COPD  Patient returns for acute office visit.  Patient continues to have recurrent COPD exacerbations.  Patient was seen last visit in January changed from [place] to [facility] .  CT sinus showed no acute sinus disease.  Shortly after visit patient was called in doxycycline and prednisone for persistent symptoms of cough and wheezing.  He says he got better for a couple weeks and then symptoms returned.  He was called in prednisone earlier this week.  Patient says his symptoms are much improved with decreased cough and wheezing.  Patient Sharlotte Alamo but did not understand to continue on this.  Currently he is taking Spiriva and DuoNeb nebulizer as needed Denies any hemoptysis chest pain orthopnea PND or leg swelling Spirometry today shows much improvement with FEV1 at 82%, ratio 63, FVC 95% This is significantly improved from previous spirometry with FEV1 at 43%. She has occasional nasal drainage.     No Active Allergies   There is no immunization history on file for this patient.  Past Medical History:  Diagnosis Date  . Anxiety   . Arthritis   . Cancer [facility])    Bladder Ca 2013, surgically removed  . COPD (chronic obstructive pulmonary disease) ([place])   . Hypertension   . Shortness of breath     Tobacco History: Social History   Tobacco Use  Smoking Status Former Smoker  . Packs/day: 1.00  . Years: 50.00  . Pack  years: 50.00  . Types: Cigarettes  . Last attempt to quit: 09/21/2013  . Years since quitting: 3.8  Smokeless Tobacco Never Used   Counseling given: Not Answered   Outpatient Encounter Medications as of 08/14/2017  Medication Sig  . albuterol (PROVENTIL HFA;VENTOLIN HFA) 108 (90 Base) MCG/ACT inhaler Inhale 2 puffs into the lungs every 6 (six) hours as needed for wheezing or shortness of breath.  Marland Kitchen ipratropium-albuterol (DUONEB) 0.5-2.5 (3) MG/3ML SOLN Take 3 mLs by nebulization every 6 (six) hours as needed. For shortness of breath  . losartan (COZAAR) 100 MG tablet TK 1 T PO D  . amLODipine (NORVASC) 10 MG tablet Take 1 tablet (10 mg total) by mouth daily. (Patient not taking: Reported on 08/14/2017)  . predniSONE (DELTASONE) 10 MG tablet 2 tabs daily X5 days. (Patient not taking: Reported on 08/14/2017)  . [DISCONTINUED] doxycycline (VIBRA-TABS) 100 MG tablet Take 1 tablet (100 mg total) by mouth 2 (two) times daily. (Patient not taking: Reported on 08/14/2017)  . [DISCONTINUED] SPIRIVA HANDIHALER 18 MCG inhalation capsule PLACE 1 C INTO INHALER AND INHALE QD  . [DISCONTINUED] umeclidinium-vilanterol (ANORO ELLIPTA) 62.5-25 MCG/INH AEPB Inhale 1 puff into the lungs daily. (Patient not taking: Reported on 08/14/2017)   No facility-administered encounter medications on file as of 08/14/2017.      Review of Systems  Constitutional:   No  weight loss, night sweats,  Fevers, chills, fatigue, or  lassitude.  HEENT:   No headaches,  Difficulty swallowing,  Tooth/dental problems, or  Sore throat,                No sneezing, itching, ear ache,  +nasal congestion, post nasal drip,   CV:  No chest pain,  Orthopnea, PND, swelling in lower extremities, anasarca, dizziness, palpitations, syncope.   GI  No heartburn, indigestion, abdominal pain, nausea, vomiting, diarrhea, change in bowel habits, loss of appetite, bloody stools.   Resp:    No chest wall deformity  Skin: no rash or lesions.  GU: no  dysuria, change in color of urine, no urgency or frequency.  No flank pain, no hematuria   MS:  No joint pain or swelling.  No decreased range of motion.  No back pain.    Physical Exam  BP 132/74 (BP Location: Left Arm, Cuff Size: Normal)   Pulse 65   Ht 5\' 7"  (1.702 m)   Wt 171 lb 12.8 oz (77.9 kg)   SpO2 95%   BMI 26.91 kg/m   GEN: A/Ox3; pleasant , NAD, elderly    HEENT:  [place]/AT,  EACs-clear, TMs-wnl, NOSE-clear, THROAT-clear, no lesions, no postnasal drip or exudate noted.   NECK:  Supple w/ fair ROM; no JVD; normal carotid impulses w/o bruits; no thyromegaly or nodules palpated; no lymphadenopathy.    RESP  Decreased BS in bases  no accessory muscle use, no dullness to percussion  CARD:  RRR, no m/r/g, no peripheral edema, pulses intact, no cyanosis or clubbing.  GI:   Soft & nt; nml bowel sounds; no organomegaly or masses detected.   Musco: Warm bil, no deformities or joint swelling noted.   Neuro: alert, no focal deficits noted.    Skin: Warm, no lesions or rashes    Lab Results:  CBC   Imaging: Ct Maxillofacial Ltd Wo Cm  Result Date: 07/20/2017 CLINICAL DATA:  Postnasal drip. Sinus congestion. Symptoms since December. EXAM: CT PARANASAL SINUS LIMITED WITHOUT CONTRAST TECHNIQUE: Non-contiguous multidetector CT images of the paranasal sinuses were obtained in a single plane without contrast. COMPARISON:  None. FINDINGS: The visualized portions of the paranasal sinuses are clear without evidence of significant mucosal thickening or fluid. Mild leftward nasal septal deviation is noted. The included regional soft tissues are grossly unremarkable. IMPRESSION: Clear sinuses. Electronically Signed   By: Logan Bores M.D.   On: 07/20/2017 10:05     Assessment & Plan:   COPD exacerbation Recurrent COPD exacerbation.  Suspect patient has a significant asthma component as spirometry is much improved today while on prednisone.  Will begin triple therapy with  ICS\Lama\LABA . Controlled for triggers add Zyrtec Check labs with IgE rast and CBC with differential  Plan  Patient Instructions  Begin TRELEGY 1 puff daily -rinse and gargle well after use.  Stop Spiriva  Labs today .  Begin Zyrtec 10mg  At bedtime   Mucinex DM Twice daily  As needed  Cough/congesiton  Follow up with Dr. Elsworth Soho  In 2 months and As needed   Please contact office for sooner follow up if symptoms do not improve or worsen or seek emergency care ]        Rhinitis Check labs today Ige /RAST  Add zyrtec      Rexene Edison, NP 08/14/2017

## 2017-08-14 NOTE — Patient Instructions (Addendum)
Begin TRELEGY 1 puff daily -rinse and gargle well after use.  Stop Spiriva  Labs today .  Begin Zyrtec 10mg  At bedtime   Mucinex DM Twice daily  As needed  Cough/congesiton  Follow up with Dr. Elsworth Soho  In 2 months and As needed   Please contact office for sooner follow up if symptoms do not improve or worsen or seek emergency care ]

## 2017-08-14 NOTE — Assessment & Plan Note (Signed)
Recurrent COPD exacerbation.  Suspect patient has a significant asthma component as spirometry is much improved today while on prednisone.  Will begin triple therapy with ICS\Lama\LABA . Controlled for triggers add Zyrtec Check labs with IgE rast and CBC with differential  Plan  Patient Instructions  Begin TRELEGY 1 puff daily -rinse and gargle well after use.  Stop Spiriva  Labs today .  Begin Zyrtec 10mg  At bedtime   Mucinex DM Twice daily  As needed  Cough/congesiton  Follow up with Dr. Elsworth Soho  In 2 months and As needed   Please contact office for sooner follow up if symptoms do not improve or worsen or seek emergency care ]

## 2017-08-14 NOTE — Assessment & Plan Note (Signed)
Check labs today Ige /RAST  Add zyrtec

## 2017-08-17 ENCOUNTER — Telehealth: Payer: Self-pay | Admitting: Adult Health

## 2017-08-17 LAB — RESPIRATORY ALLERGY PROFILE REGION II ~~LOC~~
Allergen, A. alternata, m6: <0.1 kU/L
Allergen, D pternoyssinus,d7: <0.1 kU/L
Allergen, Mouse Urine Protein, e78: <0.1 kU/L
Allergen, P. notatum, m1: <0.1 kU/L
Bermuda Grass: <0.1 kU/L
Box Elder IgE: <0.1 kU/L
CLASS: 0
CLASS: 0
CLASS: 0
CLASS: 0
CLASS: 0
CLASS: 0
CLASS: 0
CLASS: 0
CLASS: 0
CLASS: 0
Class: 0
Class: 0
Class: 0
Class: 0
Class: 0
Class: 0
Class: 0
Class: 0
Class: 0
Class: 0
Class: 0
Class: 0
Class: 0
Class: 0
D. farinae: <0.1 kU/L
Elm IgE: <0.1 kU/L
IgE (Immunoglobulin E), Serum: 216 kU/L — ABNORMAL HIGH (ref <=114)
Johnson Grass: <0.1 kU/L
Sheep Sorrel IgE: <0.1 kU/L
Timothy Grass: <0.1 kU/L

## 2017-08-17 LAB — INTERPRETATION:

## 2017-08-17 NOTE — Telephone Encounter (Signed)
Called and spoke with patients wife she states that he was supposed to get a prescription for his cough but it was not called in. Per TP last OV she stated he could do Mucinex for his cough and congestion. Advised patients wife that this is something over the counter and it does not have to be called in. Verbalized understanding nothing further needed.

## 2017-08-20 ENCOUNTER — Telehealth: Payer: Self-pay | Admitting: Adult Health

## 2017-08-20 NOTE — Telephone Encounter (Signed)
Called and spoke with patients wife. Advised her of patients lab results nothing further needed.

## 2017-09-09 ENCOUNTER — Encounter: Payer: Self-pay | Admitting: Adult Health

## 2017-09-09 ENCOUNTER — Ambulatory Visit (INDEPENDENT_AMBULATORY_CARE_PROVIDER_SITE_OTHER): Payer: Medicare Other | Admitting: Adult Health

## 2017-09-09 VITALS — BP 220/120 | Temp 97.8°F | Wt 171.0 lb

## 2017-09-09 DIAGNOSIS — R300 Dysuria: Secondary | ICD-10-CM | POA: Diagnosis not present

## 2017-09-09 DIAGNOSIS — I10 Essential (primary) hypertension: Secondary | ICD-10-CM | POA: Diagnosis not present

## 2017-09-09 DIAGNOSIS — C679 Malignant neoplasm of bladder, unspecified: Secondary | ICD-10-CM | POA: Diagnosis not present

## 2017-09-09 DIAGNOSIS — F419 Anxiety disorder, unspecified: Secondary | ICD-10-CM | POA: Diagnosis not present

## 2017-09-09 MED ORDER — AMLODIPINE BESYLATE 10 MG PO TABS: 10.0000 mg | ORAL_TABLET | Freq: Every day | ORAL | 3 refills | Status: DC

## 2017-09-09 MED ORDER — CITALOPRAM HYDROBROMIDE 20 MG PO TABS: 20.0000 mg | ORAL_TABLET | Freq: Every day | ORAL | 3 refills | Status: DC

## 2017-09-09 MED ORDER — KETOCONAZOLE 2 % EX CREA: 1.0000 "application " | TOPICAL_CREAM | Freq: Every day | CUTANEOUS | 0 refills | Status: DC

## 2017-09-09 NOTE — Patient Instructions (Signed)
It was great meeting you today   Please make an appointment next week for your physical   I have added a new blood pressure medication - please start this when you get it   I have also prescribed Celexa for anxiety - take this daily.Start cutting back on Xanax   You have been referred to urology

## 2017-09-09 NOTE — Progress Notes (Signed)
Patient presents to clinic today to establish care. He is a pleasant 72 year old male who  has a past medical history of Anxiety, Arthritis, Cancer ([place]), COPD (chronic obstructive pulmonary disease) ([place]), Hypertension, and Shortness of breath.   Acute Concerns: Establish  Care   Chronic Issues: Essential Hypertension - he takes Losartan 100 mg daily. Has been prescribed Norvasc in the past but stopped this medication for unknown reason. He does not check his blood pressure. Denies any headaches, blurred vision, or slurred speech.   Hyperlipidemia - he takes " something for cholesterol" Is not sure what that medication is.   Bladder Cancer - Was seen by Oncology? Or Urologist? Did not like his attitude so he stopped going. He reports that over the last few years he has experienced a burning sensation that happens when he starts urinating but goes away once the urination starts. Also reports pain that radiates down the inside of his right leg, this too goes away once he starts urinating. The last time he went to see his urologist was 2-3 years ago.   Anxiety - Has been prescribed Xanax in the past and just recently got his medication refilled. Only takes it PRN.   Health Maintenance: Dental -- Does not do routine care  Vision -- Does not do routine care  Immunizations -- Refuses vaccinations  Colonoscopy -- 2008. Does not want to do any longer.   Treatment Team  1. Dr. Elsworth Soho NP [place] - Pulmonary    Past Medical History:  Diagnosis Date  . Anxiety   . Arthritis   . Cancer [facility])    Bladder Ca 2013, surgically removed  . COPD (chronic obstructive pulmonary disease) ([place])   . Hypertension   . Shortness of breath     Past Surgical History:  Procedure Laterality Date  . BACK SURGERY    . cancer removed      Current Outpatient Medications on File Prior to Visit  Medication Sig Dispense Refill  . albuterol (PROVENTIL HFA;VENTOLIN HFA) 108 (90 Base) MCG/ACT inhaler Inhale 2  puffs into the lungs every 6 (six) hours as needed for wheezing or shortness of breath. 1 Inhaler 0  . ALPRAZolam (XANAX) 0.25 MG tablet Take 0.25 mg by mouth daily.    . Fluticasone-Umeclidin-Vilant (TRELEGY ELLIPTA) 100-62.5-25 MCG/INH AEPB Inhale 1 puff into the lungs daily. 1 each 0  . ipratropium-albuterol (DUONEB) 0.5-2.5 (3) MG/3ML SOLN Take 3 mLs by nebulization every 6 (six) hours as needed. For shortness of breath 180 mL 3  . losartan (COZAAR) 100 MG tablet TK 1 T PO D  2   No current facility-administered medications on file prior to visit.     No Active Allergies  Family History  Problem Relation Age of Onset  . Cancer Brother   . Cancer Father   . Cancer Sister     Social History   Socioeconomic History  . Marital status: Married    Spouse name: Not on file  . Number of children: Not on file  . Years of education: Not on file  . Highest education level: Not on file  Occupational History  . Not on file  Social Needs  . Financial resource strain: Not on file  . Food insecurity:    Worry: Not on file    Inability: Not on file  . Transportation needs:    Medical: Not on file    Non-medical: Not on file  Tobacco Use  . Smoking status: Former Smoker  Packs/day: 1.00    Years: 50.00    Pack years: 50.00    Types: Cigarettes    Last attempt to quit: 09/21/2013    Years since quitting: 3.9  . Smokeless tobacco: Never Used  Substance and Sexual Activity  . Alcohol use: No  . Drug use: No  . Sexual activity: Yes    Birth control/protection: None  Lifestyle  . Physical activity:    Days per week: Not on file    Minutes per session: Not on file  . Stress: Not on file  Relationships  . Social connections:    Talks on phone: Not on file    Gets together: Not on file    Attends religious service: Not on file    Active member of club or organization: Not on file    Attends meetings of clubs or organizations: Not on file    Relationship status: Not on file    . Intimate partner violence:    Fear of current or ex partner: Not on file    Emotionally abused: Not on file    Physically abused: Not on file    Forced sexual activity: Not on file  Other Topics Concern  . Not on file  Social History Narrative  . Not on file    Review of Systems  Constitutional: Negative.   HENT: Negative.   Eyes: Negative.   Respiratory: Negative.   Cardiovascular: Negative.   Genitourinary: Positive for dysuria.  Musculoskeletal: Positive for back pain.  Neurological: Negative.   Psychiatric/Behavioral: Negative for depression, memory loss and suicidal ideas. The patient is nervous/anxious.   All other systems reviewed and are negative.   BP (!) 220/120 (BP Location: Left Arm)   Temp 97.8 F (36.6 C) (Oral)   Wt 171 lb (77.6 kg)   BMI 26.78 kg/m     Physical Exam  Constitutional: He is oriented to person, place, and time and well-developed, well-nourished, and in no distress. No distress.  Eyes: Pupils are equal, round, and reactive to light. Conjunctivae and EOM are normal. Right eye exhibits no discharge. Left eye exhibits no discharge. No scleral icterus.  Cardiovascular: Normal rate, regular rhythm, normal heart sounds and intact distal pulses. Exam reveals no gallop and no friction rub.  No murmur heard. Pulmonary/Chest: Effort normal and breath sounds normal. No respiratory distress. He has no wheezes. He has no rales. He exhibits no tenderness.  Abdominal: Soft. Bowel sounds are normal. He exhibits no distension.  Musculoskeletal: Normal range of motion. He exhibits no edema, tenderness or deformity.  Neurological: He is alert and oriented to person, place, and time. Gait normal. GCS score is 15.  Skin: Skin is warm and dry. No rash noted. He is not diaphoretic. No erythema. No pallor.  Psychiatric: Mood, memory, affect and judgment normal.  Nursing note and vitals reviewed.   Recent Results (from the past 2160 hour(s))  Resp Allergy  Profile Regn2DC DE MD [facility]     Status: Abnormal   Collection Time: 08/14/17  2:55 PM  Result Value Ref Range   Allergen, D pternoyssinus,d7 <0.10 kU/L   Class 0    D. farinae <0.10 kU/L   Class 0    Allergen, P. notatum, m1 <0.10 kU/L   Class 0    CLADOSPORIUM HERBARUM (M2) IGE <0.10 kU/L   Class 0    Aspergillus fumigatus, m3 <0.10 kU/L   Class 0    Allergen, A. alternata, m6 <0.10 kU/L   Class 0  Cat Dander <0.10 kU/L   Class 0    Dog Dander <0.10 kU/L   Class 0    Cockroach <0.10 kU/L   Class 0    Box Elder IgE <0.10 kU/L   Class 0    Allergen, Comm Silver Wendee Copp, t9 <0.10 kU/L   Class 0    Allergen, Cedar tree, t12 <0.10 kU/L   Class 0    Allergen, Cottonwood, t14 <0.10 kU/L   Class 0    Allergen, Oak,t7 <0.10 kU/L   Class 0    Elm IgE <0.10 kU/L   Class 0    Pecan/Hickory Tree IgE <0.10 kU/L   Class 0    Allergen, Mulberry, t76 <0.10 kU/L   Class 0    [place] Grass <0.10 kU/L   Class 0    Timothy Grass <0.10 kU/L   Class 0    [place] Grass <0.10 kU/L   Class 0    COMMON RAGWEED (SHORT) (W1) IGE <0.10 kU/L   Class 0    Rough Pigweed  IgE <0.10 kU/L   Class 0    Sheep Sorrel IgE <0.10 kU/L   Class 0    Allergen, Mouse Urine Protein, e78 <0.10 kU/L   Class 0    IgE (Immunoglobulin E), Serum 216 (H) <OR=114 kU/L  CBC with Differential/Platelet     Status: Abnormal   Collection Time: 08/14/17  2:55 PM  Result Value Ref Range   WBC 12.5 (H) 4.0 - 10.5 K/uL   RBC 5.05 4.22 - 5.81 Mil/uL   Hemoglobin 15.4 13.0 - 17.0 g/dL   HCT 45.7 39.0 - 52.0 %   MCV 90.5 78.0 - 100.0 fl   MCHC 33.7 30.0 - 36.0 g/dL   RDW 15.7 (H) 11.5 - 15.5 %   Platelets 412.0 (H) 150.0 - 400.0 K/uL   Neutrophils Relative % 88.3 (H) 43.0 - 77.0 %    Comment: A Manual Differential was performed and is consistent with the Automated Differential.   Lymphocytes Relative 8.4 (L) 12.0 - 46.0 %   Monocytes Relative 2.3 (L) 3.0 - 12.0 %   Eosinophils Relative 0.2 0.0 - 5.0 %   Basophils  Relative 0.8 0.0 - 3.0 %   Neutro Abs 11.0 (H) 1.4 - 7.7 K/uL   Lymphs Abs 1.1 0.7 - 4.0 K/uL   Monocytes Absolute 0.3 0.1 - 1.0 K/uL   Eosinophils Absolute 0.0 0.0 - 0.7 K/uL   Basophils Absolute 0.1 0.0 - 0.1 K/uL  Interpretation:     Status: None   Collection Time: 08/14/17  2:55 PM  Result Value Ref Range   Interpretation      Comment: . Specific                        Level of Allergen IGE Class      kU/L             Specific IGE Antibody  -----         ---------        -------------------   0              <0.10           Absent/Undetectable   0/1        0.10-0.34           Very Low Level   1          0.35-0.69  Low Level   2          0.70-3.49           Moderate Level   3          3.50-17.4           High Level   4          17.5-49.9           Very High Level   5            50-100            Very High Level   6              >100            Very High Level . The clinical relevance of allergen results of 0.10-0.34 kU/L are undetermined and intended for  specialist use. . Allergens denoted with a "**" include results using one or more analyte specific reagents. In those cases, the test was developed and its analytical performance characteristics have been determined by [employer]. It has not been cleared or approved by the U.S. Food and Drug Administration. This assay  has been v alidated pursuant to the [employer]  and is used for clinical purposes.     Assessment/Plan: 1. Essential hypertension - Severely elevated today. Will add norvasc 10 mg to regimen. Advised to leave here and go to the pharmacy to get the medication, take right away.  - Follow up in one week or sooner if needed - Monitor BP at home  - amLODipine (NORVASC) 10 MG tablet; Take 1 tablet (10 mg total) by mouth daily.  Dispense: 90 tablet; Refill: 3  2. Dysuria  - POC Urinalysis Dipstick  3. Malignant neoplasm of urinary bladder, unspecified site [facility])  - Ambulatory  referral to Urology  4. Anxiety - Advised that I will not write for Xanax. He is ok with this. Will start him on Celexa and have him start weaning off xanax - citalopram (CELEXA) 20 MG tablet; Take 1 tablet (20 mg total) by mouth daily.  Dispense: 30 tablet; Refill: 3  Dorothyann Peng, NP

## 2017-09-17 ENCOUNTER — Other Ambulatory Visit: Payer: Self-pay | Admitting: Adult Health

## 2017-09-18 NOTE — Telephone Encounter (Signed)
Patient changed to Trelegy last ov

## 2017-10-01 ENCOUNTER — Ambulatory Visit (INDEPENDENT_AMBULATORY_CARE_PROVIDER_SITE_OTHER): Payer: Medicare Other | Admitting: Adult Health

## 2017-10-01 ENCOUNTER — Encounter: Payer: Self-pay | Admitting: Adult Health

## 2017-10-01 ENCOUNTER — Other Ambulatory Visit: Payer: Self-pay | Admitting: Adult Health

## 2017-10-01 VITALS — BP 168/82 | Temp 97.7°F | Ht 65.0 in | Wt 162.0 lb

## 2017-10-01 DIAGNOSIS — Z1159 Encounter for screening for other viral diseases: Secondary | ICD-10-CM | POA: Diagnosis not present

## 2017-10-01 DIAGNOSIS — Z0000 Encounter for general adult medical examination without abnormal findings: Secondary | ICD-10-CM

## 2017-10-01 DIAGNOSIS — G629 Polyneuropathy, unspecified: Secondary | ICD-10-CM | POA: Diagnosis not present

## 2017-10-01 DIAGNOSIS — F419 Anxiety disorder, unspecified: Secondary | ICD-10-CM

## 2017-10-01 DIAGNOSIS — R079 Chest pain, unspecified: Secondary | ICD-10-CM

## 2017-10-01 DIAGNOSIS — E039 Hypothyroidism, unspecified: Secondary | ICD-10-CM

## 2017-10-01 DIAGNOSIS — I10 Essential (primary) hypertension: Secondary | ICD-10-CM | POA: Diagnosis not present

## 2017-10-01 DIAGNOSIS — J449 Chronic obstructive pulmonary disease, unspecified: Secondary | ICD-10-CM

## 2017-10-01 DIAGNOSIS — E785 Hyperlipidemia, unspecified: Secondary | ICD-10-CM | POA: Diagnosis not present

## 2017-10-01 LAB — BASIC METABOLIC PANEL
BUN: 17 mg/dL (ref 6–23)
CHLORIDE: 101 meq/L (ref 96–112)
CO2: 27 meq/L (ref 19–32)
CREATININE: 1.22 mg/dL (ref 0.40–1.50)
Calcium: 9.9 mg/dL (ref 8.4–10.5)
GFR: 61.86 mL/min (ref >60.00)
Glucose, Bld: 101 mg/dL — ABNORMAL HIGH (ref 70–99)
Potassium: 4.6 mEq/L (ref 3.5–5.1)
Sodium: 138 mEq/L (ref 135–145)

## 2017-10-01 LAB — T3, FREE: T3, Free: 3.5 pg/mL (ref 2.3–4.2)

## 2017-10-01 LAB — CBC WITH DIFFERENTIAL/PLATELET
Basophils Absolute: 0.1 K/uL (ref 0.0–0.1)
Basophils Relative: 1.5 % (ref 0.0–3.0)
Eosinophils Absolute: 0.5 K/uL (ref 0.0–0.7)
Eosinophils Relative: 5.7 % — ABNORMAL HIGH (ref 0.0–5.0)
HCT: 48.6 % (ref 39.0–52.0)
Hemoglobin: 16.4 g/dL (ref 13.0–17.0)
Lymphocytes Relative: 24.4 % (ref 12.0–46.0)
Lymphs Abs: 2.1 K/uL (ref 0.7–4.0)
MCHC: 33.7 g/dL (ref 30.0–36.0)
MCV: 90.1 fl (ref 78.0–100.0)
Monocytes Absolute: 0.9 K/uL (ref 0.1–1.0)
Monocytes Relative: 10.7 % (ref 3.0–12.0)
Neutro Abs: 5.1 K/uL (ref 1.4–7.7)
Neutrophils Relative %: 57.7 % (ref 43.0–77.0)
Platelets: 383 K/uL (ref 150.0–400.0)
RBC: 5.39 Mil/uL (ref 4.22–5.81)
RDW: 15.3 % (ref 11.5–15.5)
WBC: 8.8 K/uL (ref 4.0–10.5)

## 2017-10-01 LAB — TSH: TSH: 7.39 u[IU]/mL — ABNORMAL HIGH (ref 0.35–4.50)

## 2017-10-01 LAB — HEPATIC FUNCTION PANEL
ALT: 20 U/L (ref 0–53)
AST: 16 U/L (ref 0–37)
Albumin: 4.5 g/dL (ref 3.5–5.2)
Alkaline Phosphatase: 76 U/L (ref 39–117)
BILIRUBIN TOTAL: 0.5 mg/dL (ref 0.2–1.2)
Bilirubin, Direct: 0.1 mg/dL (ref 0.0–0.3)
Total Protein: 7.2 g/dL (ref 6.0–8.3)

## 2017-10-01 LAB — LIPID PANEL
CHOL/HDL RATIO: 6
CHOLESTEROL: 272 mg/dL — AB (ref 0–200)
HDL: 44.8 mg/dL (ref >39.00)
NonHDL: 227.44
TRIGLYCERIDES: 245 mg/dL — AB (ref 0.0–149.0)
VLDL: 49 mg/dL — AB (ref 0.0–40.0)

## 2017-10-01 LAB — HEMOGLOBIN A1C: Hgb A1c MFr Bld: 6.3 % (ref 4.6–6.5)

## 2017-10-01 LAB — LDL CHOLESTEROL, DIRECT: Direct LDL: 186 mg/dL

## 2017-10-01 LAB — T4, FREE: FREE T4: 0.74 ng/dL (ref 0.60–1.60)

## 2017-10-01 LAB — PSA: PSA: 1.63 ng/mL (ref 0.10–4.00)

## 2017-10-01 MED ORDER — GABAPENTIN 300 MG PO CAPS: 300.0000 mg | ORAL_CAPSULE | Freq: Two times a day (BID) | ORAL | 0 refills | Status: DC

## 2017-10-01 NOTE — Patient Instructions (Signed)
It was great seeing you today   I have sent in a prescription for a medication called Gabapentin, take this twice a day for pain   Follow up with me in 3 weeks

## 2017-10-01 NOTE — Progress Notes (Signed)
Subjective:    Patient ID: Connor Pontiff Sr., male    DOB: 1945/09/23, 72 y.o.   MRN: [id number]  HPI  Patient presents for yearly preventative medicine examination. She is a pleasant 72 year old male who  has a past medical history of Anxiety, Arthritis, Cancer ([place]), COPD (chronic obstructive pulmonary disease) ([place]), Hypertension, and Shortness of breath.  Essential Hypertension -takes losartan 100 mg daily.  During his establish care visit blood pressure was severely elevated in the 220/120.  He had been prescribed Norvasc in the past but stopped taking this medication for an unknown reason.  Due to elevated blood pressure Norvasc was restarted. Reports that he has been taking this medication as prescribed.  BP Readings from Last 3 Encounters:  10/01/17 (!) 168/82  09/09/17 (!) 220/120  08/14/17 132/74   Hyperlipidemia - he takes " something for cholesterol" Is not sure what that medication is.   Anxiety -was prescribed Xanax in the past to take as needed.  During his initial visit he was prescribed Celexa to help with anxiety attacks and was advised to start weaning himself off Xanax. He feels as though his anxiety has improved.   COPD-seen by pulmonary, currently prescribed Ellipta inhaler and DuoNeb nebulizers- stable  Chronic Back Pain -long-standing issue, has been seen by pain management and reports that they did injections into his lumbar spine and did not provide  any improvement in his pain.  Fuses to be seen by pain management at this time.  Neuropathy -reports over the last few months burning sensation right lower extremity.  Has trouble sleeping due to the pain.  Has has not noticed any redness, warmth, or swelling.  Chest Pain -reports episodic sharp stabbing left-sided chest pain that is nonradiating.  Chest pain last a few seconds at most.  Reports that this is been a long-standing issue.  Has never been seen by cardiology  All immunizations and health maintenance  protocols were reviewed with the patient and needed orders were placed.  Appropriate screening laboratory values were ordered for the patient including screening of hyperlipidemia, renal function and hepatic function. If indicated by BPH, a PSA was ordered.  Medication reconciliation,  past medical history, social history, problem list and allergies were reviewed in detail with the patient  Goals were established with regard to weight loss, exercise, and  diet in compliance with medications  End of life planning was discussed.  His last colonoscopy was in 2008, he refuses to do any further.  Does not participate in routine dental or vision care  Review of Systems  Constitutional: Negative.   HENT: Negative.   Eyes: Negative.   Respiratory: Negative.   Cardiovascular: Positive for chest pain. Negative for palpitations and leg swelling.  Gastrointestinal: Negative.   Endocrine: Negative.   Genitourinary: Negative.   Musculoskeletal: Positive for arthralgias and back pain. Negative for gait problem and joint swelling.  Skin: Negative.   Allergic/Immunologic: Negative.   Neurological:       Burning sensation in right lower extremity  Hematological: Negative.   Psychiatric/Behavioral: The patient is nervous/anxious.   All other systems reviewed and are negative.  Past Medical History:  Diagnosis Date  . Anxiety   . Arthritis   . Cancer [facility])    Bladder Ca 2013, surgically removed  . COPD (chronic obstructive pulmonary disease) ([place])   . Hypertension   . Shortness of breath     Social History   Socioeconomic History  . Marital status: Married  Spouse name: Not on file  . Number of children: 5  . Years of education: 6  . Highest education level: Not on file  Occupational History  . Not on file  Social Needs  . Financial resource strain: Not on file  . Food insecurity:    Worry: Not on file    Inability: Not on file  . Transportation needs:    Medical: Not on file      Non-medical: Not on file  Tobacco Use  . Smoking status: Former Smoker    Packs/day: 1.00    Years: 50.00    Pack years: 50.00    Types: Cigarettes    Last attempt to quit: 09/21/2013    Years since quitting: 4.0  . Smokeless tobacco: Never Used  Substance and Sexual Activity  . Alcohol use: No  . Drug use: No  . Sexual activity: Yes    Birth control/protection: None  Lifestyle  . Physical activity:    Days per week: Not on file    Minutes per session: Not on file  . Stress: Not on file  Relationships  . Social connections:    Talks on phone: Not on file    Gets together: Not on file    Attends religious service: Not on file    Active member of club or organization: Not on file    Attends meetings of clubs or organizations: Not on file    Relationship status: Not on file  . Intimate partner violence:    Fear of current or ex partner: Not on file    Emotionally abused: Not on file    Physically abused: Not on file    Forced sexual activity: Not on file  Other Topics Concern  . Not on file  Social History Narrative   Worked as a Games developer - since retired.     Past Surgical History:  Procedure Laterality Date  . BACK SURGERY    . cancer removed      Family History  Problem Relation Age of Onset  . Cancer Brother   . Cancer Father   . Cancer Sister     No Active Allergies  Current Outpatient Medications on File Prior to Visit  Medication Sig Dispense Refill  . albuterol (PROVENTIL HFA;VENTOLIN HFA) 108 (90 Base) MCG/ACT inhaler Inhale 2 puffs into the lungs every 6 (six) hours as needed for wheezing or shortness of breath. 1 Inhaler 0  . ALPRAZolam (XANAX) 0.25 MG tablet Take 0.25 mg by mouth daily.    Marland Kitchen amLODipine (NORVASC) 10 MG tablet Take 1 tablet (10 mg total) by mouth daily. 90 tablet 3  . citalopram (CELEXA) 20 MG tablet Take 1 tablet (20 mg total) by mouth daily. 30 tablet 3  . Fluticasone-Umeclidin-Vilant (TRELEGY ELLIPTA) 100-62.5-25 MCG/INH AEPB  Inhale 1 puff into the lungs daily. 1 each 0  . ipratropium-albuterol (DUONEB) 0.5-2.5 (3) MG/3ML SOLN Take 3 mLs by nebulization every 6 (six) hours as needed. For shortness of breath 180 mL 3  . ketoconazole (NIZORAL) 2 % cream Apply 1 application topically daily. 15 g 0  . losartan (COZAAR) 100 MG tablet TK 1 T PO D  2   No current facility-administered medications on file prior to visit.     BP (!) 168/82 (BP Location: Left Arm)   Temp 97.7 F (36.5 C) (Oral)   Ht 5\' 5"  (1.651 m) Comment: WITHOUT SHOES  Wt 162 lb (73.5 kg)   BMI 26.96 kg/m  Objective:   Physical Exam  Constitutional: He is oriented to person, place, and time. He appears well-developed and well-nourished. No distress.  HENT:  Head: Normocephalic and atraumatic.  Right Ear: External ear normal.  Left Ear: External ear normal.  Nose: Nose normal.  Mouth/Throat: Oropharynx is clear and moist. Abnormal dentition. No oropharyngeal exudate.  Eyes: Pupils are equal, round, and reactive to light. Conjunctivae and EOM are normal. Right eye exhibits no discharge. Left eye exhibits no discharge. No scleral icterus.  Neck: Normal range of motion. Neck supple. No JVD present. No tracheal deviation present. No thyromegaly present.  Cardiovascular: Normal rate, regular rhythm, normal heart sounds and intact distal pulses. Exam reveals no gallop and no friction rub.  No murmur heard. Chest pain not reproducible with palpation  Pulmonary/Chest: Effort normal and breath sounds normal. No stridor. No respiratory distress. He has no wheezes. He has no rales. He exhibits no tenderness.  Abdominal: Soft. Bowel sounds are normal. He exhibits no distension and no mass. There is no tenderness. There is no rebound and no guarding.  Musculoskeletal: Normal range of motion. He exhibits no edema, tenderness or deformity.  Lymphadenopathy:    He has no cervical adenopathy.  Neurological: He is alert and oriented to person, place, and  time. He has normal reflexes. He displays normal reflexes. No cranial nerve deficit. He exhibits normal muscle tone. Coordination normal.  Skin: Skin is warm and dry. No rash noted. He is not diaphoretic. No erythema. No pallor.  Psychiatric: He has a normal mood and affect. His behavior is normal. Judgment and thought content normal.  Nursing note and vitals reviewed.     Assessment & Plan:  1. Routine general medical examination at a health care facility - Heart healthy diet and exercise - Follow up in one year or sooner if needed - Basic metabolic panel - CBC with Differential/Platelet - Hemoglobin A1c - Hepatic function panel - Lipid panel - TSH - PSA  2. Essential hypertension - Improved but not at goal. I am wondering if some of the elevation is due to chronic pain. Will monitor in three weeks at follow up  - Basic metabolic panel - CBC with Differential/Platelet - Hemoglobin A1c - Hepatic function panel - Lipid panel - TSH - PSA  3. Chronic obstructive pulmonary disease, unspecified COPD type ([place]) - Follow up with Pulmonary   4. Hyperlipidemia, unspecified hyperlipidemia type - Taking statin, consider dose increase  - Basic metabolic panel - CBC with Differential/Platelet - Hemoglobin A1c - Hepatic function panel - Lipid panel - TSH - PSA  5. Anxiety - Continue with Celexa   6. Need for hepatitis C screening test  - Hep C Antibody  7. Chest pain, unspecified type - Advised cardiology consult due to sharp chest pain. He refused at this time. He understands his risks, including death - EKG 12-Lead- NSR, Rate 75   8. Neuropathy - gabapentin (NEURONTIN) 300 MG capsule; Take 1 capsule (300 mg total) by mouth 2 (two) times daily.  Dispense: 180 capsule; Refill: 0 - Follow up in three weeks   Dorothyann Peng, NP

## 2017-10-01 NOTE — Addendum Note (Signed)
Addended by: Honor Loh L on: 10/01/2017 01:00 PM   Modules accepted: Orders

## 2017-10-02 LAB — HEPATITIS C ANTIBODY
Hepatitis C Ab: NONREACTIVE
SIGNAL TO CUT-OFF: 0.06 (ref <1.00)

## 2017-10-06 ENCOUNTER — Ambulatory Visit: Payer: Medicare Other | Admitting: Adult Health

## 2017-10-07 ENCOUNTER — Other Ambulatory Visit: Payer: Self-pay | Admitting: Family Medicine

## 2017-10-07 MED ORDER — ATORVASTATIN CALCIUM 20 MG PO TABS: ORAL_TABLET | ORAL | 3 refills | Status: DC

## 2017-10-07 NOTE — Telephone Encounter (Signed)
Sent to the pharmacy by e-scribe. 

## 2017-10-15 ENCOUNTER — Encounter: Payer: Self-pay | Admitting: Pulmonary Disease

## 2017-10-15 ENCOUNTER — Ambulatory Visit: Payer: Medicare Other | Admitting: Pulmonary Disease

## 2017-10-15 DIAGNOSIS — J301 Allergic rhinitis due to pollen: Secondary | ICD-10-CM

## 2017-10-15 DIAGNOSIS — J4540 Moderate persistent asthma, uncomplicated: Secondary | ICD-10-CM | POA: Diagnosis not present

## 2017-10-15 DIAGNOSIS — J45909 Unspecified asthma, uncomplicated: Secondary | ICD-10-CM | POA: Insufficient documentation

## 2017-10-15 DIAGNOSIS — J449 Chronic obstructive pulmonary disease, unspecified: Secondary | ICD-10-CM | POA: Insufficient documentation

## 2017-10-15 MED ORDER — ALBUTEROL SULFATE (2.5 MG/3ML) 0.083% IN NEBU: 2.5000 mg | INHALATION_SOLUTION | Freq: Four times a day (QID) | RESPIRATORY_TRACT | 12 refills | Status: DC | PRN

## 2017-10-15 MED ORDER — FLUTICASONE FUROATE-VILANTEROL 100-25 MCG/INH IN AEPB: 1.0000 | INHALATION_SPRAY | Freq: Every day | RESPIRATORY_TRACT | 3 refills | Status: DC

## 2017-10-15 MED ORDER — FLUTICASONE FUROATE-VILANTEROL 100-25 MCG/INH IN AEPB: 1.0000 | INHALATION_SPRAY | Freq: Every day | RESPIRATORY_TRACT | 0 refills | Status: DC

## 2017-10-15 NOTE — Patient Instructions (Signed)
Prescription for Breo 100 -once daily, rinse mouth after use. This is your maintenance medication and use daily  Albuterol 2 puffs every 6 hours as needed This is your rescue medication  Take Zyrtec as needed for pollen allergies

## 2017-10-15 NOTE — Assessment & Plan Note (Signed)
Prescription for Breo 100 -once daily, rinse mouth after use. This is your maintenance medication and use daily  Albuterol 2 puffs every 6 hours as needed This is your rescue medication

## 2017-10-15 NOTE — Assessment & Plan Note (Signed)
Take Zyrtec as needed for pollen allergies

## 2017-10-15 NOTE — Progress Notes (Signed)
   Subjective:    Patient ID: Connor Pontiff Sr., male    DOB: December 27, 1945, 72 y.o.   MRN: [id number]  HPI  72 yo male ex-smoker Presents for FU of COPD/asthma. He smoked a pack a day since the age of 15-about 40 pack years   He had a bad winter with several exacerbations requiring prednisone.  On his last visit spirometry showed surprisingly improved FEV1 almost normal range suggesting that he has asthma rather than COPD. He feels much improved.  Unfortunately last few visits he has been getting samples and has not filled in any prescription.  Last visit he was given Trelegy and again he used a sample but did not follow it up with the prescription.  He is only using duo nebs on an as-needed basis  He feels back to his baseline now, complains of pollen allergies.  No nocturnal symptoms or wheezing  Significant tests/ events reviewed  CT chest from 10/2006 reviewed  Spirometry 11/2013  FEV1 43% - 1.31 - with ratio 53 Post bronchodilator, FEV1 improved to 1.56 -significant bronchodilator response.   Arlyce Harman 10/2014 FEV 1 69%   08/2017 Spirometry- FEV1 at 82%, ratio 63, FVC 95% IgE 216, RAST neg    Review of Systems Patient denies significant dyspnea,cough, hemoptysis,  chest pain, palpitations, pedal edema, orthopnea, paroxysmal nocturnal dyspnea, lightheadedness, nausea, vomiting, abdominal or  leg pains      Objective:   Physical Exam  Gen. Pleasant, well-nourished, in no distress ENT - no thrush, no post nasal drip Neck: No JVD, no thyromegaly, no carotid bruits Lungs: no use of accessory muscles, no dullness to percussion, clear without rales or rhonchi  Cardiovascular: Rhythm regular, heart sounds  normal, no murmurs or gallops, no peripheral edema Musculoskeletal: No deformities, no cyanosis or clubbing        Assessment & Plan:

## 2017-10-16 ENCOUNTER — Encounter: Payer: Self-pay | Admitting: Family Medicine

## 2017-10-22 ENCOUNTER — Ambulatory Visit (INDEPENDENT_AMBULATORY_CARE_PROVIDER_SITE_OTHER): Payer: Medicare Other | Admitting: Adult Health

## 2017-10-22 ENCOUNTER — Encounter: Payer: Self-pay | Admitting: Adult Health

## 2017-10-22 VITALS — BP 138/78 | Temp 97.8°F | Wt 167.0 lb

## 2017-10-22 DIAGNOSIS — I10 Essential (primary) hypertension: Secondary | ICD-10-CM | POA: Diagnosis not present

## 2017-10-22 NOTE — Progress Notes (Signed)
Subjective:    Patient ID: Connor Pontiff Sr., male    DOB: [birth year], 72 y.o.   MRN: [id number]  HPI 72 year old male who  has a past medical history of Anxiety, Arthritis, Cancer ([place]), COPD (chronic obstructive pulmonary disease) ([place]), Hypertension, and Shortness of breath. He presents to the office today for three week follow up regarding hypertension.   Currently prescribed Norvasc 10 mg and Losartan 100 mg daily. Reports that he is tolerating these medications well.  Denies any shortness of breath past baseline, chest pain, palpitations or lower extremity edema. He has not experienced any headaches or blurred vision  BP Readings from Last 3 Encounters:  10/22/17 138/78  10/15/17 138/78  10/01/17 (!) 168/82   Review of Systems See HPI   Past Medical History:  Diagnosis Date  . Anxiety   . Arthritis   . Cancer [facility])    Bladder Ca 2013, surgically removed  . COPD (chronic obstructive pulmonary disease) ([place])   . Hypertension   . Shortness of breath     Social History   Socioeconomic History  . Marital status: Married    Spouse name: Not on file  . Number of children: 5  . Years of education: 6  . Highest education level: Not on file  Occupational History  . Not on file  Social Needs  . Financial resource strain: Not on file  . Food insecurity:    Worry: Not on file    Inability: Not on file  . Transportation needs:    Medical: Not on file    Non-medical: Not on file  Tobacco Use  . Smoking status: Former Smoker    Packs/day: 1.00    Years: 50.00    Pack years: 50.00    Types: Cigarettes    Last attempt to quit: 09/21/2013    Years since quitting: 4.0  . Smokeless tobacco: Never Used  Substance and Sexual Activity  . Alcohol use: No  . Drug use: No  . Sexual activity: Yes    Birth control/protection: None  Lifestyle  . Physical activity:    Days per week: Not on file    Minutes per session: Not on file  . Stress: Not on file  Relationships  .  Social connections:    Talks on phone: Not on file    Gets together: Not on file    Attends religious service: Not on file    Active member of club or organization: Not on file    Attends meetings of clubs or organizations: Not on file    Relationship status: Not on file  . Intimate partner violence:    Fear of current or ex partner: Not on file    Emotionally abused: Not on file    Physically abused: Not on file    Forced sexual activity: Not on file  Other Topics Concern  . Not on file  Social History Narrative   Worked as a Games developer - since retired.     Past Surgical History:  Procedure Laterality Date  . BACK SURGERY    . cancer removed      Family History  Problem Relation Age of Onset  . Cancer Brother   . Cancer Father   . Cancer Sister     No Known Allergies  Current Outpatient Medications on File Prior to Visit  Medication Sig Dispense Refill  . albuterol (PROVENTIL HFA;VENTOLIN HFA) 108 (90 Base) MCG/ACT inhaler Inhale 2 puffs into the lungs every  6 (six) hours as needed for wheezing or shortness of breath. 1 Inhaler 0  . albuterol (PROVENTIL) (2.5 MG/3ML) 0.083% nebulizer solution Take 3 mLs (2.5 mg total) by nebulization every 6 (six) hours as needed for wheezing or shortness of breath. 75 mL 12  . ALPRAZolam (XANAX) 0.25 MG tablet Take 0.25 mg by mouth daily.    Marland Kitchen amLODipine (NORVASC) 10 MG tablet Take 1 tablet (10 mg total) by mouth daily. 90 tablet 3  . atorvastatin (LIPITOR) 20 MG tablet Take 1 tablet orally before bedtime to reduce cholesterol. 90 tablet 3  . citalopram (CELEXA) 20 MG tablet Take 1 tablet (20 mg total) by mouth daily. 30 tablet 3  . fluticasone furoate-vilanterol (BREO ELLIPTA) 100-25 MCG/INH AEPB Inhale 1 puff into the lungs daily. 1 each 0  . fluticasone furoate-vilanterol (BREO ELLIPTA) 100-25 MCG/INH AEPB Inhale 1 puff into the lungs daily. 1 each 3  . gabapentin (NEURONTIN) 300 MG capsule Take 1 capsule (300 mg total) by mouth 2 (two)  times daily. 180 capsule 0  . ipratropium-albuterol (DUONEB) 0.5-2.5 (3) MG/3ML SOLN Take 3 mLs by nebulization every 6 (six) hours as needed. For shortness of breath 180 mL 3  . ketoconazole (NIZORAL) 2 % cream Apply 1 application topically daily. 15 g 0  . losartan (COZAAR) 100 MG tablet TK 1 T PO D  2  . tamsulosin (FLOMAX) 0.4 MG CAPS capsule TK 1 C PO QD  6   No current facility-administered medications on file prior to visit.     BP 138/78   Temp 97.8 F (36.6 C) (Oral)   Wt 167 lb (75.8 kg)   BMI 26.16 kg/m      Objective:   Physical Exam  Constitutional: He is oriented to person, place, and time. He appears well-developed and well-nourished. No distress.  Cardiovascular: Normal rate, regular rhythm, normal heart sounds and intact distal pulses. Exam reveals no gallop and no friction rub.  No murmur heard. Pulmonary/Chest: Effort normal and breath sounds normal. No stridor. No respiratory distress. He has no wheezes. He has no rales. He exhibits no tenderness.  Neurological: He is alert and oriented to person, place, and time.  Skin: Skin is warm and dry. Capillary refill takes less than 2 seconds. He is not diaphoretic.  Psychiatric: He has a normal mood and affect. His behavior is normal. Judgment and thought content normal.  Nursing note and vitals reviewed.     Assessment & Plan:  1. Essential hypertension - Near goal. He has been working on life style modifications since he has learned he was pre diabetic  - No change in medications at this time   Dorothyann Peng, NP

## 2017-11-12 ENCOUNTER — Other Ambulatory Visit: Payer: Self-pay | Admitting: Urology

## 2017-11-12 NOTE — Progress Notes (Signed)
10-01-17 (Epic) EKG  04-07-17 (Epic) CXR

## 2017-11-12 NOTE — Patient Instructions (Addendum)
Connor Kavian Peters Sr.  11/12/2017   Your procedure is scheduled on: 11-16-17   Report to [facility] Main  Entrance    Report to Admitting at 12:30 PM    Call this number if you have problems the morning of surgery [phone]   Remember: Do not eat food or drink liquids :After Midnight. You may have a Clear Liquid Diet from Midnight until 8:30 AM. After 8:30 AM, nothing until after surgery.     CLEAR LIQUID DIET   Foods Allowed                                                                     Foods Excluded  Coffee and tea, regular and decaf                             liquids that you cannot  Plain Jell-O in any flavor                                             see through such as: Fruit ices (not with fruit pulp)                                     milk, soups, orange juice  Iced Popsicles                                    All solid food Carbonated beverages, regular and diet                                    Cranberry, grape and apple juices Sports drinks like Gatorade Lightly seasoned clear broth or consume(fat free) Sugar, honey syrup  Sample Menu Breakfast                                Lunch                                     Supper Cranberry juice                    Beef broth                            Chicken broth Jell-O                                     Grape juice  Apple juice Coffee or tea                        Jell-O                                      Popsicle                                                Coffee or tea                        Coffee or tea  _____________________________________________________________________     Take these medicines the morning of surgery with A SIP OF WATER: Amlodipine (Norvasc), albuterol nebulizer if needed, and Gabapentin (Neurontin). You may also bring and use your inhaler as needed.                                You may not have any metal on your body  including hair pins and              piercings  Do not wear jewelry, make-up, lotions, powders or perfumes, deodorant             Men may shave face and neck.   Do not bring valuables to the hospital. [place].  Contacts, dentures or bridgework may not be worn into surgery.     Patients discharged the day of surgery will not be allowed to drive home.  Name and phone number of your driver:daughter [name] [phone]  Special Instructions: N/A              Please read over the following fact sheets you were given: _____________________________________________________________________           [facility] - Preparing for Surgery Before surgery, you can play an important role.  Because skin is not sterile, your skin needs to be as free of germs as possible.  You can reduce the number of germs on your skin by washing with CHG (chlorahexidine gluconate) soap before surgery.  CHG is an antiseptic cleaner which kills germs and bonds with the skin to continue killing germs even after washing. Please DO NOT use if you have an allergy to CHG or antibacterial soaps.  If your skin becomes reddened/irritated stop using the CHG and inform your nurse when you arrive at Short Stay. Do not shave (including legs and underarms) for at least 48 hours prior to the first CHG shower.  You may shave your face/neck. Please follow these instructions carefully:  1.  Shower with CHG Soap the night before surgery and the  morning of Surgery.  2.  If you choose to wash your hair, wash your hair first as usual with your  normal  shampoo.  3.  After you shampoo, rinse your hair and body thoroughly to remove the  shampoo.                           4.  Use CHG as you  would any other liquid soap.  You can apply chg directly  to the skin and wash                       Gently with a scrungie or clean washcloth.  5.  Apply the CHG Soap to your body ONLY FROM THE  NECK DOWN.   Do not use on face/ open                           Wound or open sores. Avoid contact with eyes, ears mouth and genitals (private parts).                       Wash face,  Genitals (private parts) with your normal soap.             6.  Wash thoroughly, paying special attention to the area where your surgery  will be performed.  7.  Thoroughly rinse your body with warm water from the neck down.  8.  DO NOT shower/wash with your normal soap after using and rinsing off  the CHG Soap.                9.  Pat yourself dry with a clean towel.            10.  Wear clean pajamas.            11.  Place clean sheets on your bed the night of your first shower and do not  sleep with pets. Day of Surgery : Do not apply any lotions/deodorants the morning of surgery.  Please wear clean clothes to the hospital/surgery center.  FAILURE TO FOLLOW THESE INSTRUCTIONS MAY RESULT IN THE CANCELLATION OF YOUR SURGERY PATIENT SIGNATURE_________________________________  NURSE SIGNATURE__________________________________  ________________________________________________________________________

## 2017-11-13 ENCOUNTER — Ambulatory Visit: Payer: Medicare Other | Admitting: Podiatry

## 2017-11-13 ENCOUNTER — Ambulatory Visit (INDEPENDENT_AMBULATORY_CARE_PROVIDER_SITE_OTHER): Payer: Medicare Other

## 2017-11-13 ENCOUNTER — Encounter: Payer: Self-pay | Admitting: Podiatry

## 2017-11-13 ENCOUNTER — Encounter (HOSPITAL_COMMUNITY): Payer: Self-pay

## 2017-11-13 ENCOUNTER — Encounter (HOSPITAL_COMMUNITY)
Admission: RE | Admit: 2017-11-13 | Discharge: 2017-11-13 | Disposition: A | Discharge status: Home / Self Care | Payer: Medicare Other | Source: Ambulatory Visit | Attending: Urology | Admitting: Urology

## 2017-11-13 ENCOUNTER — Other Ambulatory Visit: Payer: Self-pay

## 2017-11-13 DIAGNOSIS — G709 Myoneural disorder, unspecified: Secondary | ICD-10-CM | POA: Diagnosis not present

## 2017-11-13 DIAGNOSIS — M779 Enthesopathy, unspecified: Secondary | ICD-10-CM

## 2017-11-13 DIAGNOSIS — D494 Neoplasm of unspecified behavior of bladder: Secondary | ICD-10-CM | POA: Diagnosis present

## 2017-11-13 DIAGNOSIS — M7741 Metatarsalgia, right foot: Secondary | ICD-10-CM

## 2017-11-13 DIAGNOSIS — J449 Chronic obstructive pulmonary disease, unspecified: Secondary | ICD-10-CM | POA: Diagnosis not present

## 2017-11-13 DIAGNOSIS — C679 Malignant neoplasm of bladder, unspecified: Secondary | ICD-10-CM | POA: Diagnosis not present

## 2017-11-13 DIAGNOSIS — Z87891 Personal history of nicotine dependence: Secondary | ICD-10-CM | POA: Diagnosis not present

## 2017-11-13 DIAGNOSIS — M7742 Metatarsalgia, left foot: Secondary | ICD-10-CM | POA: Diagnosis not present

## 2017-11-13 DIAGNOSIS — Z8551 Personal history of malignant neoplasm of bladder: Secondary | ICD-10-CM | POA: Diagnosis not present

## 2017-11-13 DIAGNOSIS — N200 Calculus of kidney: Secondary | ICD-10-CM | POA: Diagnosis not present

## 2017-11-13 DIAGNOSIS — I10 Essential (primary) hypertension: Secondary | ICD-10-CM | POA: Diagnosis not present

## 2017-11-13 DIAGNOSIS — Z7951 Long term (current) use of inhaled steroids: Secondary | ICD-10-CM | POA: Diagnosis not present

## 2017-11-13 HISTORY — DX: Personal history of urinary calculi: Z87.442

## 2017-11-13 LAB — CBC
HEMATOCRIT: 44.3 % (ref 39.0–52.0)
Hemoglobin: 15 g/dL (ref 13.0–17.0)
MCH: 30.3 pg (ref 26.0–34.0)
MCHC: 33.9 g/dL (ref 30.0–36.0)
MCV: 89.5 fL (ref 78.0–100.0)
PLATELETS: 438 10*3/uL — AB (ref 150–400)
RBC: 4.95 MIL/uL (ref 4.22–5.81)
RDW: 15 % (ref 11.5–15.5)
WBC: 11.1 10*3/uL — AB (ref 4.0–10.5)

## 2017-11-13 LAB — BASIC METABOLIC PANEL
Anion gap: 8 (ref 5–15)
BUN: 21 mg/dL — ABNORMAL HIGH (ref 6–20)
CO2: 27 mmol/L (ref 22–32)
Calcium: 9.6 mg/dL (ref 8.9–10.3)
Chloride: 103 mmol/L (ref 101–111)
Creatinine, Ser: 1.3 mg/dL — ABNORMAL HIGH (ref 0.61–1.24)
GFR calc Af Amer: >60 mL/min (ref >60)
GFR calc non Af Amer: 53 mL/min — ABNORMAL LOW (ref >60)
Glucose, Bld: 106 mg/dL — ABNORMAL HIGH (ref 65–99)
Potassium: 4.3 mmol/L (ref 3.5–5.1)
Sodium: 138 mmol/L (ref 135–145)

## 2017-11-13 NOTE — Progress Notes (Signed)
Cbc and bmet labs routed to dr bell by epic for review

## 2017-11-15 DIAGNOSIS — M7741 Metatarsalgia, right foot: Secondary | ICD-10-CM | POA: Insufficient documentation

## 2017-11-15 DIAGNOSIS — M7742 Metatarsalgia, left foot: Principal | ICD-10-CM

## 2017-11-15 NOTE — Progress Notes (Signed)
Subjective:   Patient ID: Connor Pontiff Sr., male   DOB: 72 y.o.   MRN: [id number]   HPI 72 year old male presents the office today for concerns of pain to the balls of his feet he states that he feels like he is walking on rocks.  He thinks he has arthritis in his toes.  He states he previously had blisters to the area that opened up to the hospital but those all resolved.  His main issue is that he feels that he is walking on something hard into the ball of his feet.  Occasionally some burning but no specific numbness or tingling sharp or shooting pain.  He said no recent treatment for this.  No recent injury.  He has no other concerns today.   Review of Systems  All other systems reviewed and are negative.  Past Medical History:  Diagnosis Date  . Anxiety   . Arthritis    and back problems  . Cancer [facility])    Bladder Ca 2013, surgically removed  . COPD (chronic obstructive pulmonary disease) ([place])   . History of kidney stones    once  2013 had surgery  . Hypertension   . Shortness of breath    with exertion    Past Surgical History:  Procedure Laterality Date  . BACK SURGERY     lower  . cancer removed     x 2     Current Outpatient Medications:  .  albuterol (PROVENTIL HFA;VENTOLIN HFA) 108 (90 Base) MCG/ACT inhaler, Inhale 2 puffs into the lungs every 6 (six) hours as needed for wheezing or shortness of breath., Disp: 1 Inhaler, Rfl: 0 .  albuterol (PROVENTIL) (2.5 MG/3ML) 0.083% nebulizer solution, Take 3 mLs (2.5 mg total) by nebulization every 6 (six) hours as needed for wheezing or shortness of breath., Disp: 75 mL, Rfl: 12 .  amLODipine (NORVASC) 10 MG tablet, Take 1 tablet (10 mg total) by mouth daily., Disp: 90 tablet, Rfl: 3 .  Aspirin-Salicylamide-Caffeine (BC HEADACHE POWDER PO), Take 1 packet by mouth daily., Disp: , Rfl:  .  atorvastatin (LIPITOR) 20 MG tablet, Take 1 tablet orally before bedtime to reduce cholesterol., Disp: 90 tablet, Rfl: 3 .   citalopram (CELEXA) 20 MG tablet, Take 1 tablet (20 mg total) by mouth daily. (Patient taking differently: Take 20 mg by mouth at bedtime. ), Disp: 30 tablet, Rfl: 3 .  fluticasone furoate-vilanterol (BREO ELLIPTA) 100-25 MCG/INH AEPB, Inhale 1 puff into the lungs daily. (Patient taking differently: Inhale 1 puff into the lungs daily as needed (shortness of breath). ), Disp: 1 each, Rfl: 0 .  fluticasone furoate-vilanterol (BREO ELLIPTA) 100-25 MCG/INH AEPB, Inhale 1 puff into the lungs daily. (Patient not taking: Reported on 11/12/2017), Disp: 1 each, Rfl: 3 .  gabapentin (NEURONTIN) 300 MG capsule, Take 1 capsule (300 mg total) by mouth 2 (two) times daily., Disp: 180 capsule, Rfl: 0 .  ipratropium-albuterol (DUONEB) 0.5-2.5 (3) MG/3ML SOLN, Take 3 mLs by nebulization every 6 (six) hours as needed. For shortness of breath (Patient not taking: Reported on 11/12/2017), Disp: 180 mL, Rfl: 3 .  ketoconazole (NIZORAL) 2 % cream, Apply 1 application topically daily. (Patient not taking: Reported on 11/12/2017), Disp: 15 g, Rfl: 0 .  losartan (COZAAR) 100 MG tablet, Take 100mg  by mouth daily, Disp: , Rfl: 2 .  tamsulosin (FLOMAX) 0.4 MG CAPS capsule, Take 0.4mg  by mouth daily, Disp: , Rfl: 6  No Known Allergies       Objective:  Physical Exam  General: AAO x3, NAD  Dermatological: Skin is warm, dry and supple bilateral. There are no open sores, no preulcerative lesions, no rash or signs of infection present.  There is currently no blisters present.  Vascular: Dorsalis Pedis artery and Posterior Tibial artery pedal pulses are 2/4 bilateral with immedate capillary fill time. PThere is no pain with calf compression, swelling, warmth, erythema.   Neruologic: Grossly intact via light touch bilateral.  Protective threshold with Semmes Wienstein monofilament intact to all pedal sites bilateral.  Musculoskeletal: Prominence of metatarsal heads plantarly and subjective there is tenderness over metatarsals 1-5  bilaterally but there is no specific area pinpoint bony tenderness identified today.  There is no edema, erythema, increase in warmth.  There is no palpable neuroma identified today.  Muscular strength 5/5 in all groups tested bilateral.  Gait: Unassisted, Nonantalgic.       Assessment:   Metatarsalgia bilaterally     Plan:  -Treatment options discussed including all alternatives, risks, and complications -Etiology of symptoms were discussed -X-rays were obtained and reviewed with the patient. There is no evidence of acute fracture or stress fracture identified today.  -We discussed offloading.  Dispensed metatarsal pads as well as a gel metatarsal pad to help take pressure off the area as well.  Discussed topical anti-inflammatory cream and wishes to hold off on that and he thinks that just offloading will be helpful.  We also discussed the change in shoes.  Also consider orthotics if needed.  Trula Slade DPM

## 2017-11-16 ENCOUNTER — Ambulatory Visit (HOSPITAL_COMMUNITY): Payer: Medicare Other | Admitting: Anesthesiology

## 2017-11-16 ENCOUNTER — Encounter (HOSPITAL_COMMUNITY): Payer: Self-pay | Admitting: Emergency Medicine

## 2017-11-16 ENCOUNTER — Ambulatory Visit (HOSPITAL_COMMUNITY)
Admission: RE | Admit: 2017-11-16 | Discharge: 2017-11-16 | Disposition: A | Discharge status: Home / Self Care | Payer: Medicare Other | Source: Ambulatory Visit | Attending: Urology | Admitting: Urology

## 2017-11-16 ENCOUNTER — Encounter (HOSPITAL_COMMUNITY)
Admission: RE | Disposition: A | Discharge status: Home / Self Care | Payer: Self-pay | Source: Ambulatory Visit | Attending: Urology

## 2017-11-16 DIAGNOSIS — Z8551 Personal history of malignant neoplasm of bladder: Secondary | ICD-10-CM | POA: Diagnosis not present

## 2017-11-16 DIAGNOSIS — I10 Essential (primary) hypertension: Secondary | ICD-10-CM | POA: Insufficient documentation

## 2017-11-16 DIAGNOSIS — Z7951 Long term (current) use of inhaled steroids: Secondary | ICD-10-CM | POA: Insufficient documentation

## 2017-11-16 DIAGNOSIS — J449 Chronic obstructive pulmonary disease, unspecified: Secondary | ICD-10-CM | POA: Diagnosis not present

## 2017-11-16 DIAGNOSIS — G709 Myoneural disorder, unspecified: Secondary | ICD-10-CM | POA: Insufficient documentation

## 2017-11-16 DIAGNOSIS — N200 Calculus of kidney: Secondary | ICD-10-CM | POA: Insufficient documentation

## 2017-11-16 DIAGNOSIS — C679 Malignant neoplasm of bladder, unspecified: Secondary | ICD-10-CM | POA: Diagnosis not present

## 2017-11-16 DIAGNOSIS — Z87891 Personal history of nicotine dependence: Secondary | ICD-10-CM | POA: Insufficient documentation

## 2017-11-16 HISTORY — PX: TRANSURETHRAL RESECTION OF BLADDER TUMOR: SHX2575

## 2017-11-16 SURGERY — TURBT (TRANSURETHRAL RESECTION OF BLADDER TUMOR)
Anesthesia: General

## 2017-11-16 MED ORDER — EPHEDRINE SULFATE 50 MG/ML IJ SOLN: INTRAMUSCULAR | Status: DC | PRN

## 2017-11-16 MED ORDER — SODIUM CHLORIDE 0.9 % IR SOLN
Status: DC | PRN
  Administered 2017-11-16: 3000 mL via INTRAVESICAL

## 2017-11-16 MED ORDER — PROPOFOL 10 MG/ML IV BOLUS
INTRAVENOUS | Status: AC
  Filled 2017-11-16: qty 20

## 2017-11-16 MED ORDER — CEFAZOLIN SODIUM-DEXTROSE 2-4 GM/100ML-% IV SOLN
INTRAVENOUS | Status: AC
  Filled 2017-11-16: qty 100

## 2017-11-16 MED ORDER — SUCCINYLCHOLINE CHLORIDE 200 MG/10ML IV SOSY
PREFILLED_SYRINGE | INTRAVENOUS | Status: DC | PRN
  Administered 2017-11-16: 120 mg via INTRAVENOUS

## 2017-11-16 MED ORDER — PROPOFOL 10 MG/ML IV BOLUS
INTRAVENOUS | Status: DC | PRN
  Administered 2017-11-16: 170 mg via INTRAVENOUS

## 2017-11-16 MED ORDER — LIDOCAINE 2% (20 MG/ML) 5 ML SYRINGE
INTRAMUSCULAR | Status: DC | PRN
Start: 2017-11-16 — End: 2017-11-16
  Administered 2017-11-16: 50 mg via INTRAVENOUS

## 2017-11-16 MED ORDER — OXYCODONE HCL 5 MG PO TABS: 5.0000 mg | ORAL_TABLET | Freq: Once | ORAL | Status: DC | PRN

## 2017-11-16 MED ORDER — CEFAZOLIN SODIUM-DEXTROSE 2-4 GM/100ML-% IV SOLN
2.0000 g | INTRAVENOUS | Status: AC
  Administered 2017-11-16: 2 g via INTRAVENOUS

## 2017-11-16 MED ORDER — FENTANYL CITRATE (PF) 100 MCG/2ML IJ SOLN
INTRAMUSCULAR | Status: DC | PRN
  Administered 2017-11-16 (×2): 50 ug via INTRAVENOUS

## 2017-11-16 MED ORDER — PROMETHAZINE HCL 25 MG/ML IJ SOLN: 6.2500 mg | INTRAMUSCULAR | Status: DC | PRN

## 2017-11-16 MED ORDER — HYDROCODONE-ACETAMINOPHEN 5-325 MG PO TABS: 1.0000 | ORAL_TABLET | ORAL | 0 refills | Status: DC | PRN

## 2017-11-16 MED ORDER — HYDROMORPHONE HCL 1 MG/ML IJ SOLN: 0.2500 mg | INTRAMUSCULAR | Status: DC | PRN

## 2017-11-16 MED ORDER — FENTANYL CITRATE (PF) 100 MCG/2ML IJ SOLN
INTRAMUSCULAR | Status: AC
  Filled 2017-11-16: qty 2

## 2017-11-16 MED ORDER — OXYCODONE HCL 5 MG/5ML PO SOLN
5.0000 mg | Freq: Once | ORAL | Status: DC | PRN
  Filled 2017-11-16: qty 5

## 2017-11-16 MED ORDER — LABETALOL HCL 5 MG/ML IV SOLN
INTRAVENOUS | Status: DC | PRN
  Administered 2017-11-16: 5 mg via INTRAVENOUS

## 2017-11-16 MED ORDER — LACTATED RINGERS IV SOLN
INTRAVENOUS | Status: DC
Start: 2017-11-16 — End: 2017-11-16
  Administered 2017-11-16: 13:00:00 via INTRAVENOUS

## 2017-11-16 SURGICAL SUPPLY — 14 items
BAG URINE DRAINAGE (UROLOGICAL SUPPLIES) IMPLANT
BAG URO CATCHER STRL LF (MISCELLANEOUS) ×3 IMPLANT
COVER FOOTSWITCH UNIV (MISCELLANEOUS) ×3 IMPLANT
ELECT REM PT RETURN 15FT ADLT (MISCELLANEOUS) IMPLANT
GLOVE BIOGEL M STRL SZ7.5 (GLOVE) ×9 IMPLANT
GOWN STRL REUS W/TWL LRG LVL3 (GOWN DISPOSABLE) ×6 IMPLANT
LOOP CUT BIPOLAR 24F LRG (ELECTROSURGICAL) ×3 IMPLANT
MANIFOLD NEPTUNE II (INSTRUMENTS) ×3 IMPLANT
PACK CYSTO (CUSTOM PROCEDURE TRAY) ×3 IMPLANT
SET ASPIRATION TUBING (TUBING) IMPLANT
SYRINGE IRR TOOMEY STRL 70CC (SYRINGE) IMPLANT
TUBING CONNECTING 10 (TUBING) ×2 IMPLANT
TUBING CONNECTING 10' (TUBING) ×1
TUBING UROLOGY SET (TUBING) ×3 IMPLANT

## 2017-11-16 NOTE — Discharge Instructions (Signed)

## 2017-11-16 NOTE — Transfer of Care (Signed)
Immediate Anesthesia Transfer of Care Note  Patient: Connor Delbert Phenix Sr.  Procedure(s) Performed: Procedure(s): TRANSURETHRAL RESECTION OF BLADDER TUMOR (TURBT) (N/A)  Patient Location: PACU  Anesthesia Type:General  Level of Consciousness:  sedated, patient cooperative and responds to stimulation  Airway & Oxygen Therapy:Patient Spontanous Breathing and Patient connected to face mask oxgen  Post-op Assessment:  Report given to PACU RN and Post -op Vital signs reviewed and stable  Post vital signs:  Reviewed and stable  Last Vitals:  Vitals:   11/16/17 1231 11/16/17 1751  BP: (!) 165/86 (!) 166/78  Pulse: 91 82  Resp: 18 13  Temp: 36.7 C 36.5 C  SpO2: 57% 84%    Complications: No apparent anesthesia complications

## 2017-11-16 NOTE — Anesthesia Procedure Notes (Signed)
Procedure Name: Intubation Date/Time: 11/16/2017 5:19 PM Performed by: Anne Fu, CRNA Pre-anesthesia Checklist: Patient identified, Emergency Drugs available, Suction available, Patient being monitored and Timeout performed Patient Re-evaluated:Patient Re-evaluated prior to induction Oxygen Delivery Method: Circle system utilized Preoxygenation: Pre-oxygenation with 100% oxygen Induction Type: IV induction Ventilation: Mask ventilation without difficulty Laryngoscope Size: Mac and 4 Grade View: Grade I Tube type: Oral Tube size: 7.5 mm Number of attempts: 1 Airway Equipment and Method: Stylet Placement Confirmation: ETT inserted through vocal cords under direct vision,  positive ETCO2 and breath sounds checked- equal and bilateral Secured at: 19 cm Tube secured with: Tape Dental Injury: Teeth and Oropharynx as per pre-operative assessment

## 2017-11-16 NOTE — Interval H&P Note (Signed)
History and Physical Interval Note:  11/16/2017 4:51 PM  Connor Delbert Phenix Sr.  has presented today for surgery, with the diagnosis of BLADDER CANCER  The various methods of treatment have been discussed with the patient and family. After consideration of risks, benefits and other options for treatment, the patient has consented to  Procedure(s): TRANSURETHRAL RESECTION OF BLADDER TUMOR (TURBT) (N/A) as a surgical intervention .  The patient's history has been reviewed, patient examined, no change in status, stable for surgery.  I have reviewed the patient's chart and labs.  Questions were answered to the patient's satisfaction.     [name], [name]

## 2017-11-16 NOTE — Op Note (Signed)
Operative Note  Preoperative diagnosis:  1.  Bladder tumor  Postoperative diagnosis: 1.  Bladder tumor  Procedure(s): 1.  Transurethral resection of bladder tumor-medium  Surgeon: Link Snuffer, MD  Assistants: None  Anesthesia: General  Complications: None  EBL: Minimal  Specimens: 1.  Bladder tumor  Drains/Catheters: 1.  None  Intraoperative findings: 1.  Normal urethra 2.  Approximately 2.5 cm of papillary appearing mucosa on the posterior bladder wall appeared low-grade and superficial.  There was another 2 areas of slightly raised mucosa that were fulgurated.  All abnormal areas were resected/or fulgurated bilateral ureteral orifices were identified and well away from the area of resection.  Both E flux clear urine  Indication: 72 year old male with a history of low-grade TA bladder tumors was found to have a recurrence on cystoscopy.  He presents today for the previously mentioned operation.  Description of procedure:  The patient was identified and consent was obtained.  The patient was taken to the operating room and placed in the supine position.  The patient was placed under general anesthesia.  Perioperative antibiotics were administered.  The patient was placed in dorsal lithotomy.  Patient was prepped and draped in a standard sterile fashion and a timeout was performed.  A 26 French resectoscope with the visual obturator in place was advanced into the urethra and into the bladder.  This was exchanged for the working element and the bladder tumors were identified.  These were resected on bipolar settings and/or fulgurated.  Bladder tumor was collected for specimen.  All areas of abnormalities were completely resected or fulgurated.  There was no active bleeding noted after collection of the bladder tumor specimen.  I drained the bladder and withdrew the scope and this concluded the operation.  Patient tolerated the procedure well and was stable postoperatively.  Plan:  Follow-up in 1 to 2 weeks for pathology review.

## 2017-11-16 NOTE — Anesthesia Preprocedure Evaluation (Signed)
Anesthesia Evaluation  Patient identified by MRN, date of birth, ID band Patient awake    Reviewed: Allergy & Precautions, H&P , NPO status , Patient's Chart, lab work & pertinent test results, reviewed documented beta blocker date and time   Airway Mallampati: II  TM Distance: >3 FB Neck ROM: full    Dental no notable dental hx.    Pulmonary asthma , COPD, former smoker,    Pulmonary exam normal breath sounds clear to auscultation       Cardiovascular Exercise Tolerance: Good hypertension, Pt. on medications negative cardio ROS   Rhythm:regular Rate:Normal     Neuro/Psych  Headaches, PSYCHIATRIC DISORDERS Anxiety  Neuromuscular disease    GI/Hepatic negative GI ROS, GERD  ,(+)     substance abuse  alcohol use,   Endo/Other    Renal/GU      Musculoskeletal  (+) Arthritis , Osteoarthritis,    Abdominal   Peds  Hematology   Anesthesia Other Findings   Reproductive/Obstetrics negative OB ROS                             Anesthesia Physical Anesthesia Plan  ASA: II  Anesthesia Plan: General   Post-op Pain Management:    Induction: Intravenous  PONV Risk Score and Plan: 2 and Ondansetron, Treatment may vary due to age or medical condition and Dexamethasone  Airway Management Planned: LMA and Oral ETT  Additional Equipment:   Intra-op Plan:   Post-operative Plan: Extubation in OR  Informed Consent: I have reviewed the patients History and Physical, chart, labs and discussed the procedure including the risks, benefits and alternatives for the proposed anesthesia with the patient or authorized representative who has indicated his/her understanding and acceptance.   Dental Advisory Given  Plan Discussed with: CRNA, Anesthesiologist and Surgeon  Anesthesia Plan Comments: ( )        Anesthesia Quick Evaluation

## 2017-11-16 NOTE — H&P (Signed)
CC/HPI: CC: History of bladder cancer  HPI:  10/06/17  Previous patient of Dr. Karsten Ro. He has not followed up since 2016 because he dislikes having to undergo cystoscopy. He has a history of low-grade TA bladder cancer and has had 2 resections in the past for this. The first was discovered incidentally during ureteroscopy for a ureteral calculus. He had a low-grade tumor that involved the distal most portion of the ureteral orifice on the right but was primarily in the bladder. He subsequently had a tiny recurrence that was resected. He also complains about lower urinary tract symptoms, most bothersome complaint is nocturia 2. He does have a weaker stream as well. He did have some dysuria but this improved after taking gabapentin. Denies current hematuria or dysuria. Urinalysis is negative today.   11/06/17  Patient underwent a CT IVP which revealed the following:   IMPRESSION:  1. No evidence of recurrent or metastatic bladder cancer.  2. Bilateral renal cysts. No evidence of renal mass.  3. Nonobstructing bilateral renal calculi. No evidence of ureteral  calculus or hydronephrosis.  4. Aortic Atherosclerosis (ICD10-I70.0) and Emphysema (ICD10-J43.9).   Patient presents today for cystoscopy and rule out bladder cancer recurrence.     ALLERGIES: No Allergies    MEDICATIONS: Tamsulosin Hcl 0.4 mg capsule 1 capsule PO Daily  Alprazolam 0.25 mg tablet 0 Oral  Amlodipine Besylate 10 mg tablet  Citalopram Hbr 20 mg tablet  Gabapentin 300 mg capsule  Losartan Potassium 100 mg tablet  Trelegy Ellipta 100 mcg-62.5 mcg-25 mcg/actuation blister, with inhalation device     GU PSH: Cystoscopy TURBT <2 cm - 2008 Locm 300-399Mg /Ml Iodine,1Ml - 10/14/2017      PSH Notes: Inguinal Hernia Repair, Cystoscopy With Fulguration Small Lesion (5-30mm), Splenectomy, Back Surgery, Lung Surgery   NON-GU PSH: Lung Surgery (Unspecified) - 2008 Splenectomy - 2008        GU PMH: BPH w/LUTS -  10/06/2017 Nocturia - 10/06/2017 Weak Urinary Stream - 10/06/2017 BPH w/o LUTS, Benign enlargement of prostate - 2016 Bladder Cancer, Unspec, Transitional cell carcinoma of bladder - 2016 Dysuria, Dysuria - 2016 Ureteral calculus, Ureteral calculus, left - 2016 Renal calculus, Bilateral kidney stones - 2016 Encounter for Prostate Cancer screening, Prostate cancer screening - 2015 History of bladder cancer, Bladder Cancer - 2014 Renal cyst, Renal cysts, acquired, bilateral - 2014    NON-GU PMH: Encounter for general adult medical examination without abnormal findings, Encounter for preventive health examination - 2016 Personal history of other diseases of the respiratory system, History of chronic obstructive lung disease - 2015 Unspecified thoracic, thoracolumbar and lumbosacral intervertebral disc disorder, Bulging disc - 2015 Anxiety, Anxiety (Symptom) - 2014 Personal history of other diseases of the circulatory system, History of hypertension - 2014 COPD    FAMILY HISTORY: Cancer - Brother, Father Father Deceased At Age58 ___ - Runs In Family Heart Disease - Mother Mother Deceased At Age 41 from diabetic complicati - Runs In Family nephrolithiasis - Daughter   SOCIAL HISTORY: Marital Status: Married Preferred Language: English; Ethnicity: Not Hispanic Or Latino; Race: White Current Smoking Status: Patient does not smoke anymore. Has not smoked since 09/07/2012.  <DIV'  Tobacco Use Assessment Completed:  Used Tobacco in last 30 days?   Has never drank.  Drinks 1 caffeinated drink per day.     Notes: Former smoker, Current every day smoker, Alcohol Use, Caffeine Use, Current Every Day Smoker, Occupation:, Marital History - Currently Married, Drug Use   REVIEW OF SYSTEMS:  GU Review Male:  Patient denies frequent urination, hard to postpone urination, burning/ pain with urination, get up at night to urinate, leakage of urine, stream starts and stops, trouble starting your stream,  have to strain to urinate , erection problems, and penile pain.    Gastrointestinal (Upper):  Patient denies nausea, vomiting, and indigestion/ heartburn.    Gastrointestinal (Lower):  Patient denies diarrhea and constipation.    Constitutional:  Patient denies fever, night sweats, weight loss, and fatigue.    Skin:  Patient denies skin rash/ lesion and itching.    Eyes:  Patient denies blurred vision and double vision.    Ears/ Nose/ Throat:  Patient denies sore throat and sinus problems.    Hematologic/Lymphatic:  Patient denies swollen glands and easy bruising.    Cardiovascular:  Patient denies leg swelling and chest pains.    Respiratory:  Patient denies cough and shortness of breath.    Endocrine:  Patient denies excessive thirst.    Musculoskeletal:  Patient denies back pain and joint pain.    Neurological:  Patient denies headaches and dizziness.    Psychologic:  Patient denies depression and anxiety.    VITAL SIGNS:       11/06/2017 08:43 AM     BP 158/89 mmHg     Heart Rate 85 /min     Temperature 98.3 F / 36.8 C     MULTI-SYSTEM PHYSICAL EXAMINATION:      Constitutional: Well-nourished. No physical deformities. Normally developed. Good grooming.     Respiratory: No labored breathing, no use of accessory muscles. Normal breath sounds.     Cardiovascular: Normal temperature, adequate peripheral perfusion of extremities     Skin: No paleness, no jaundice     Neurologic / Psychiatric: Oriented to time, oriented to place, oriented to person. No depression, no anxiety, no agitation.     Gastrointestinal: No mass, no tenderness, no rigidity, non obese abdomen.     Eyes: Normal conjunctivae. Normal eyelids.     Musculoskeletal: Normal gait and station of head and neck.            PAST DATA REVIEWED:   Source Of History:  Patient  X-Ray Review: C.T. Abdomen/Pelvis: Reviewed Films. Reviewed Report. Discussed With Patient.      12/28/12 11/27/10 06/15/08 05/14/07 09/13/03  PSA   Total PSA 1.30  1.33  0.85  0.84  0.54     PROCEDURES:    Flexible Cystoscopy - 52000  Risks, benefits, and some of the potential complications of the procedure were discussed at length with the patient including infection, bleeding, voiding discomfort, urinary retention, fever, chills, sepsis, and others. All questions were answered. Informed consent was obtained. Antibiotic prophylaxis was given. Sterile technique and intraurethral analgesia were used.  Meatus:  Normal size. Normal location. Normal condition.  Urethra:  No strictures.  External Sphincter:  Normal.  Verumontanum:  Normal.  Prostate:  Borderline obstructing. Mild hyperplasia.  Bladder Neck:  Non-obstructing.  Ureteral Orifices:  Normal location. Normal size. Normal shape.   Bladder:  No trabeculation. Roughly 2 cm area of slightly papillary appearing mucosa consistent with a low-grade superficial tumor      The lower urinary tract was carefully examined. The procedure was well-tolerated and without complications. Antibiotic instructions were given. Instructions were given to call the office immediately for bloody urine, difficulty urinating, urinary retention, painful or frequent urination, fever, chills, nausea, vomiting or other illness. The patient stated that he understood these instructions and would comply with  them.    Urinalysis  Dipstick Dipstick Cont'd  Color: Yellow Bilirubin: Neg  Appearance: Clear Ketones: Neg  Specific Gravity: 1.020 Blood: Neg  pH: 5.5 Protein: Trace  Glucose: Neg Urobilinogen: 0.2   Nitrites: Neg   Leukocyte Esterase: Neg    ASSESSMENT:     ICD-10 Details  1 GU:  Bladder Cancer, Unspec - C67.9   2  Renal calculus - N20.0    PLAN:   Schedule  Return Visit/Planned Activity: 1 Year - Cystoscopy  Document  Letter(s):  Created for Patient: Clinical Summary   Notes:  Proceed with TURBT. He understands potential risks including but not limited to bleeding, infection, injury to  surrounding structures, need for additional procedures.   Observe renal calculi. They're currently small and nonobstructive.   Signed by Link Snuffer, [name], M.D. on 11/06/17 at 9:19 AM (EDT

## 2017-11-16 NOTE — Anesthesia Postprocedure Evaluation (Signed)
Anesthesia Post Note  Patient: Connor Pontiff Sr.  Procedure(s) Performed: TRANSURETHRAL RESECTION OF BLADDER TUMOR (TURBT) (N/A )     Patient location during evaluation: PACU Anesthesia Type: General Level of consciousness: awake and alert Pain management: pain level controlled Vital Signs Assessment: post-procedure vital signs reviewed and stable Respiratory status: spontaneous breathing, nonlabored ventilation and respiratory function stable Cardiovascular status: blood pressure returned to baseline and stable Postop Assessment: no apparent nausea or vomiting Anesthetic complications: no    Last Vitals:  Vitals:   11/16/17 1800 11/16/17 1815  BP: (!) 147/74 138/75  Pulse: 80 77  Resp: 11 13  Temp:  (!) 36.4 C  SpO2: 97% 95%    Last Pain:  Vitals:   11/16/17 1815  TempSrc:   PainSc: 0-No pain                 Lynda Rainwater

## 2017-11-17 ENCOUNTER — Encounter (HOSPITAL_COMMUNITY): Payer: Self-pay | Admitting: Urology

## 2017-12-12 ENCOUNTER — Other Ambulatory Visit: Payer: Self-pay | Admitting: Adult Health

## 2017-12-12 DIAGNOSIS — F419 Anxiety disorder, unspecified: Secondary | ICD-10-CM

## 2017-12-12 DIAGNOSIS — G629 Polyneuropathy, unspecified: Secondary | ICD-10-CM

## 2017-12-15 NOTE — Telephone Encounter (Signed)
Sent to the pharmacy by e-scribe. 

## 2018-02-15 ENCOUNTER — Ambulatory Visit: Payer: Medicare Other | Admitting: Adult Health

## 2018-04-23 ENCOUNTER — Ambulatory Visit (INDEPENDENT_AMBULATORY_CARE_PROVIDER_SITE_OTHER): Payer: Medicare Other | Admitting: Adult Health

## 2018-04-23 ENCOUNTER — Encounter: Payer: Self-pay | Admitting: Adult Health

## 2018-04-23 VITALS — BP 120/90 | Temp 97.7°F | Wt 165.0 lb

## 2018-04-23 DIAGNOSIS — R7303 Prediabetes: Secondary | ICD-10-CM | POA: Diagnosis not present

## 2018-04-23 DIAGNOSIS — E785 Hyperlipidemia, unspecified: Secondary | ICD-10-CM | POA: Diagnosis not present

## 2018-04-23 DIAGNOSIS — I10 Essential (primary) hypertension: Secondary | ICD-10-CM | POA: Diagnosis not present

## 2018-04-23 LAB — HEMOGLOBIN A1C: Hgb A1c MFr Bld: 6.3 % (ref 4.6–6.5)

## 2018-04-23 LAB — LIPID PANEL
CHOLESTEROL: 162 mg/dL (ref 0–200)
HDL: 42.9 mg/dL (ref >39.00)
LDL CALC: 87 mg/dL (ref 0–99)
NonHDL: 118.93
TRIGLYCERIDES: 161 mg/dL — AB (ref 0.0–149.0)
Total CHOL/HDL Ratio: 4
VLDL: 32.2 mg/dL (ref 0.0–40.0)

## 2018-04-23 NOTE — Progress Notes (Signed)
Subjective:    Patient ID: [name] Sr., male    DOB: Apr 18, 1946, 72 y.o.   MRN: [id number]  HPI  72 year old male who  has a past medical history of Anxiety, Arthritis, Cancer ([place]), COPD (chronic obstructive pulmonary disease) ([place]), History of kidney stones, Hypertension, and Shortness of breath.  He presents to the office today for follow up regarding hypertension, hyperlipidemia, and elevated glucose.   Hypertension - He is currently prescribed norvasc 10 mg and cozaar 100 mg daily.   BP Readings from Last 3 Encounters:  04/23/18 120/90  11/16/17 (!) 154/95  11/13/17 116/75   Hyperlipidemia - currently prescribed Lipitor 20 mg   Lab Results  Component Value Date   CHOL 272 (H) 10/01/2017   HDL 44.80 10/01/2017   LDLDIRECT 186.0 10/01/2017   TRIG 245.0 (H) 10/01/2017   CHOLHDL 6 10/01/2017   Pre diabetes  Lab Results  Component Value Date   HGBA1C 6.3 10/01/2017   He reports making lifestyle changes since finding out he was pre diabetic.   Has no acute complaints   Review of Systems See HPI   Past Medical History:  Diagnosis Date  . Anxiety   . Arthritis    and back problems  . Cancer [facility])    Bladder Ca 2013, surgically removed  . COPD (chronic obstructive pulmonary disease) ([place])   . History of kidney stones    once  2013 had surgery  . Hypertension   . Shortness of breath    with exertion    Social History   Socioeconomic History  . Marital status: Married    Spouse name: Not on file  . Number of children: 5  . Years of education: 6  . Highest education level: Not on file  Occupational History  . Not on file  Social Needs  . Financial resource strain: Not on file  . Food insecurity:    Worry: Not on file    Inability: Not on file  . Transportation needs:    Medical: Not on file    Non-medical: Not on file  Tobacco Use  . Smoking status: Former Smoker    Packs/day: 1.00    Years: 50.00    Pack years: 50.00    Types:  Cigarettes    Last attempt to quit: 09/21/2013    Years since quitting: 4.5  . Smokeless tobacco: Never Used  Substance and Sexual Activity  . Alcohol use: No  . Drug use: No  . Sexual activity: Yes    Birth control/protection: None  Lifestyle  . Physical activity:    Days per week: Not on file    Minutes per session: Not on file  . Stress: Not on file  Relationships  . Social connections:    Talks on phone: Not on file    Gets together: Not on file    Attends religious service: Not on file    Active member of club or organization: Not on file    Attends meetings of clubs or organizations: Not on file    Relationship status: Not on file  . Intimate partner violence:    Fear of current or ex partner: Not on file    Emotionally abused: Not on file    Physically abused: Not on file    Forced sexual activity: Not on file  Other Topics Concern  . Not on file  Social History Narrative   Worked as a Games developer - since retired.  Past Surgical History:  Procedure Laterality Date  . BACK SURGERY     lower  . cancer removed     x 2  . TRANSURETHRAL RESECTION OF BLADDER TUMOR N/A 11/16/2017   Procedure: TRANSURETHRAL RESECTION OF BLADDER TUMOR (TURBT);  Surgeon: Lucas Mallow, MD;  Location: [facility];  Service: Urology;  Laterality: N/A;    Family History  Problem Relation Age of Onset  . Cancer Brother   . Cancer Father   . Cancer Sister     No Known Allergies  Current Outpatient Medications on File Prior to Visit  Medication Sig Dispense Refill  . albuterol (PROVENTIL HFA;VENTOLIN HFA) 108 (90 Base) MCG/ACT inhaler Inhale 2 puffs into the lungs every 6 (six) hours as needed for wheezing or shortness of breath. 1 Inhaler 0  . albuterol (PROVENTIL) (2.5 MG/3ML) 0.083% nebulizer solution Take 3 mLs (2.5 mg total) by nebulization every 6 (six) hours as needed for wheezing or shortness of breath. 75 mL 12  . amLODipine (NORVASC) 10 MG tablet Take 1 tablet (10 mg total) by  mouth daily. 90 tablet 3  . atorvastatin (LIPITOR) 20 MG tablet Take 1 tablet orally before bedtime to reduce cholesterol. 90 tablet 3  . citalopram (CELEXA) 20 MG tablet TAKE 1 TABLET(20 MG) BY MOUTH DAILY 90 tablet 1  . fluticasone furoate-vilanterol (BREO ELLIPTA) 100-25 MCG/INH AEPB Inhale 1 puff into the lungs daily. (Patient taking differently: Inhale 1 puff into the lungs daily as needed (shortness of breath). ) 1 each 0  . fluticasone furoate-vilanterol (BREO ELLIPTA) 100-25 MCG/INH AEPB Inhale 1 puff into the lungs daily. 1 each 3  . gabapentin (NEURONTIN) 300 MG capsule TAKE 1 CAPSULE(300 MG) BY MOUTH TWICE DAILY 180 capsule 1  . ipratropium-albuterol (DUONEB) 0.5-2.5 (3) MG/3ML SOLN Take 3 mLs by nebulization every 6 (six) hours as needed. For shortness of breath 180 mL 3  . ketoconazole (NIZORAL) 2 % cream Apply 1 application topically daily. 15 g 0  . losartan (COZAAR) 100 MG tablet Take 100mg  by mouth daily  2  . tamsulosin (FLOMAX) 0.4 MG CAPS capsule Take 0.4mg  by mouth daily  6   No current facility-administered medications on file prior to visit.     BP 120/90   Temp 97.7 F (36.5 C)   Wt 165 lb (74.8 kg)   BMI 25.84 kg/m       Objective:   Physical Exam  Constitutional: He is oriented to person, place, and time. He appears well-developed and well-nourished. No distress.  Cardiovascular: Normal rate, regular rhythm, normal heart sounds and intact distal pulses.  Pulmonary/Chest: Effort normal and breath sounds normal.  Neurological: He is alert and oriented to person, place, and time.  Skin: Skin is warm and dry. He is not diaphoretic.  Psychiatric: He has a normal mood and affect. His behavior is normal. Judgment and thought content normal.  Nursing note and vitals reviewed.     Assessment & Plan:  1. Hyperlipidemia, unspecified hyperlipidemia type - Consider dose change of statin  - Lipid panel  2. Pre-diabetes  - Hemoglobin A1c  3. Essential  hypertension - Well controlled on current medication regimen    Dorothyann Peng, NP

## 2018-07-12 ENCOUNTER — Telehealth: Payer: Self-pay | Admitting: Pulmonary Disease

## 2018-07-12 MED ORDER — PREDNISONE 10 MG PO TABS: 20.0000 mg | ORAL_TABLET | Freq: Every day | ORAL | 0 refills | Status: DC

## 2018-07-12 MED ORDER — AZITHROMYCIN 250 MG PO TABS: ORAL_TABLET | ORAL | 0 refills | Status: DC

## 2018-07-12 NOTE — Telephone Encounter (Signed)
Spoke with pt's spouse, c/o nonprod cough, chest congestion, fatigue X3-4 days.   Denies fever, chest pain, sinus congestion, pnd.    Pt taking otc cold medicine to help with s/s as well as his maintence med [place], and his albuterol inhaler.  Has not used albuterol neb.   requesting additional recs.  Pharmacy: [facility] on [address].    Routing to [place] for recs as RA is not in office.  Please advise on recs.  Thanks!

## 2018-07-12 NOTE — Telephone Encounter (Signed)
Spoke with pt's spouse, aware of recs.  rx's sent to preferred pharmacy.  Nothing further needed at this time- will close encounter.

## 2018-07-12 NOTE — Telephone Encounter (Signed)
Sorry to hear the patient is not feeling well.  Please encourage them to also use her albuterol nebulizers every 4-6 hours as needed for shortness of breath and wheezing.  I would prefer that he use the albuterol nebulizer when at home instead of the albuterol inhaler as it may be easier for him day and take the medication.  Ultimately though this is patient preference.  Can offer:   Azithromycin 250mg  tablet  >>>Take 2 tablets (500mg  total) today, and then 1 tablet (250mg ) for the next four days  >>>take with food  >>>can also take probiotic and / or yogurt while on antibiotic   Prednisone 10mg  tablet  >>>Take 2 tablets (20 mg total) daily for the next 5 days >>> Take with food in the morning  Please place the order  If symptoms are not improving he will need to present to our office for further evaluation.Wyn Quaker, FNP

## 2018-07-17 ENCOUNTER — Other Ambulatory Visit: Payer: Self-pay | Admitting: Adult Health

## 2018-07-17 DIAGNOSIS — F419 Anxiety disorder, unspecified: Secondary | ICD-10-CM

## 2018-07-17 DIAGNOSIS — I10 Essential (primary) hypertension: Secondary | ICD-10-CM

## 2018-07-20 NOTE — Telephone Encounter (Signed)
Amlodipine denied.  Filled for 1 year 09/2017.  Citalopram sent to the pharmacy for 90 days.  Pt due for yearly in April 2020.  Nothing further needed.

## 2018-08-09 ENCOUNTER — Other Ambulatory Visit: Payer: Self-pay | Admitting: Adult Health

## 2018-08-09 DIAGNOSIS — I10 Essential (primary) hypertension: Secondary | ICD-10-CM

## 2018-08-10 NOTE — Telephone Encounter (Signed)
Sent to the pharmacy by e-scribe for 90 days.  Pt has upcoming cpx on 10/05/2018.

## 2018-08-12 ENCOUNTER — Other Ambulatory Visit: Payer: Self-pay | Admitting: Adult Health

## 2018-08-13 NOTE — Telephone Encounter (Signed)
Cory, I do not see that you have prescribed this in the past.  Please advise.

## 2018-09-01 ENCOUNTER — Other Ambulatory Visit: Payer: Self-pay | Admitting: Pulmonary Disease

## 2018-10-05 ENCOUNTER — Encounter: Payer: Medicare Other | Admitting: Adult Health

## 2018-10-15 ENCOUNTER — Other Ambulatory Visit: Payer: Self-pay | Admitting: Adult Health

## 2018-10-15 DIAGNOSIS — I10 Essential (primary) hypertension: Secondary | ICD-10-CM

## 2018-10-18 NOTE — Telephone Encounter (Signed)
Sent to the pharmacy by e-scribe. 

## 2018-11-16 ENCOUNTER — Other Ambulatory Visit: Payer: Self-pay | Admitting: Adult Health

## 2018-11-17 NOTE — Telephone Encounter (Signed)
Pt called back and stated he did not know what it was about and I did not see any notes but that [name] will try again at a later time.  Pt is aware that she is at lunch and I will give the msg to her.

## 2018-11-17 NOTE — Telephone Encounter (Signed)
Called and spoke to [name].  Pt will be home soon.  Will try again at a later time.

## 2018-11-17 NOTE — Telephone Encounter (Signed)
Sent to the pharmacy by e-scribe.  Pt has upcoming cpx on 12/08/2018

## 2018-12-08 ENCOUNTER — Encounter: Payer: Self-pay | Admitting: Adult Health

## 2018-12-08 ENCOUNTER — Ambulatory Visit (INDEPENDENT_AMBULATORY_CARE_PROVIDER_SITE_OTHER): Payer: Medicare Other | Admitting: Adult Health

## 2018-12-08 ENCOUNTER — Other Ambulatory Visit: Payer: Self-pay

## 2018-12-08 VITALS — BP 130/76 | Temp 98.3°F | Ht 67.5 in | Wt 167.0 lb

## 2018-12-08 DIAGNOSIS — J4520 Mild intermittent asthma, uncomplicated: Secondary | ICD-10-CM

## 2018-12-08 DIAGNOSIS — E785 Hyperlipidemia, unspecified: Secondary | ICD-10-CM

## 2018-12-08 DIAGNOSIS — Z0000 Encounter for general adult medical examination without abnormal findings: Secondary | ICD-10-CM

## 2018-12-08 DIAGNOSIS — Z125 Encounter for screening for malignant neoplasm of prostate: Secondary | ICD-10-CM | POA: Diagnosis not present

## 2018-12-08 DIAGNOSIS — R7303 Prediabetes: Secondary | ICD-10-CM | POA: Diagnosis not present

## 2018-12-08 DIAGNOSIS — I10 Essential (primary) hypertension: Secondary | ICD-10-CM

## 2018-12-08 DIAGNOSIS — F419 Anxiety disorder, unspecified: Secondary | ICD-10-CM

## 2018-12-08 DIAGNOSIS — C679 Malignant neoplasm of bladder, unspecified: Secondary | ICD-10-CM

## 2018-12-08 DIAGNOSIS — G629 Polyneuropathy, unspecified: Secondary | ICD-10-CM

## 2018-12-08 LAB — LIPID PANEL
Cholesterol: 152 mg/dL (ref 0–200)
HDL: 43.6 mg/dL (ref >39.00)
LDL Cholesterol: 80 mg/dL (ref 0–99)
NonHDL: 108.33
Total CHOL/HDL Ratio: 3
Triglycerides: 142 mg/dL (ref 0.0–149.0)
VLDL: 28.4 mg/dL (ref 0.0–40.0)

## 2018-12-08 LAB — CBC WITH DIFFERENTIAL/PLATELET
Basophils Absolute: 0.1 10*3/uL (ref 0.0–0.1)
Basophils Relative: 1.2 % (ref 0.0–3.0)
Eosinophils Absolute: 0.4 10*3/uL (ref 0.0–0.7)
Eosinophils Relative: 3.9 % (ref 0.0–5.0)
HCT: 45.1 % (ref 39.0–52.0)
Hemoglobin: 15.1 g/dL (ref 13.0–17.0)
Lymphocytes Relative: 19.6 % (ref 12.0–46.0)
Lymphs Abs: 2 10*3/uL (ref 0.7–4.0)
MCHC: 33.5 g/dL (ref 30.0–36.0)
MCV: 90 fl (ref 78.0–100.0)
Monocytes Absolute: 1.1 10*3/uL — ABNORMAL HIGH (ref 0.1–1.0)
Monocytes Relative: 10.2 % (ref 3.0–12.0)
Neutro Abs: 6.8 10*3/uL (ref 1.4–7.7)
Neutrophils Relative %: 65.1 % (ref 43.0–77.0)
Platelets: 447 10*3/uL — ABNORMAL HIGH (ref 150.0–400.0)
RBC: 5.01 Mil/uL (ref 4.22–5.81)
RDW: 15.3 % (ref 11.5–15.5)
WBC: 10.4 10*3/uL (ref 4.0–10.5)

## 2018-12-08 LAB — COMPREHENSIVE METABOLIC PANEL
ALT: 17 U/L (ref 0–53)
AST: 15 U/L (ref 0–37)
Albumin: 4.7 g/dL (ref 3.5–5.2)
Alkaline Phosphatase: 90 U/L (ref 39–117)
BUN: 15 mg/dL (ref 6–23)
CO2: 26 mEq/L (ref 19–32)
Calcium: 9.6 mg/dL (ref 8.4–10.5)
Chloride: 103 mEq/L (ref 96–112)
Creatinine, Ser: 1.29 mg/dL (ref 0.40–1.50)
GFR: 54.4 mL/min — ABNORMAL LOW (ref >60.00)
Glucose, Bld: 98 mg/dL (ref 70–99)
Potassium: 4.6 mEq/L (ref 3.5–5.1)
Sodium: 138 mEq/L (ref 135–145)
Total Bilirubin: 0.6 mg/dL (ref 0.2–1.2)
Total Protein: 7.3 g/dL (ref 6.0–8.3)

## 2018-12-08 LAB — TSH: TSH: 4.81 u[IU]/mL — ABNORMAL HIGH (ref 0.35–4.50)

## 2018-12-08 LAB — HEMOGLOBIN A1C: Hgb A1c MFr Bld: 6.3 % (ref 4.6–6.5)

## 2018-12-08 LAB — PSA: PSA: 1.41 ng/mL (ref 0.10–4.00)

## 2018-12-08 MED ORDER — GABAPENTIN 300 MG PO CAPS: ORAL_CAPSULE | ORAL | 1 refills | Status: DC

## 2018-12-08 MED ORDER — PREDNISONE 20 MG PO TABS: 20.0000 mg | ORAL_TABLET | Freq: Every day | ORAL | 0 refills | Status: DC

## 2018-12-08 MED ORDER — CITALOPRAM HYDROBROMIDE 20 MG PO TABS: ORAL_TABLET | ORAL | 1 refills | Status: DC

## 2018-12-08 NOTE — Progress Notes (Signed)
Subjective:    Patient ID: Connor Pontiff Sr., male    DOB: December 26, 1945, 73 y.o.   MRN: [id number]  HPI Patient presents for yearly preventative medicine examination. He is a pleasant 73 year old male who  has a past medical history of Anxiety, Arthritis, Cancer ([place]), COPD (chronic obstructive pulmonary disease) ([place]), History of kidney stones, Hypertension, and Shortness of breath.   Essential Hypertension -currently prescribed Norvasc 10 mg and losartan 100 mg daily.  Denies lightheadedness, dizziness, chest pain, or worsening shortness of breath   BP Readings from Last 3 Encounters:  12/08/18 130/76  04/23/18 120/90  11/16/17 (!) 154/95   Hyperlipidemia -takes Lipitor 20 mg nightly.  Denies myalgia Lab Results  Component Value Date   CHOL 162 04/23/2018   HDL 42.90 04/23/2018   LDLCALC 87 04/23/2018   LDLDIRECT 186.0 10/01/2017   TRIG 161.0 (H) 04/23/2018   CHOLHDL 4 04/23/2018   Asthma -seen by pulmonary.  Is currently prescribed Brio Ellipta, DuoNeb, and albuterol rescue inhaler.  His last spirometry showed surprisingly improved FEV1 almost normal range suggesting that he has asthma rather than COPD. He reports that over the last month or so he has had some worsening shortness of breath and wheezing especially at night. Using his inhalers and nebulizers help.   Anxiety -currently prescribed Celexa 20 mg.  Chronic Low Back pain -has been seen by pain management in the past and reports having injections into his lumbar spine that did not help with his pain.  He refuses to be seen by pain management at this time  Neuropathy - Takes Gabapentin. Feels controlled.    H/O Bladder cancer-had 2 resections in the past.  This was first discovered incidentally during ureteroscopy for a kidney stone.  He had a low-grade tumor that involved the distal most portion of the urethral orifice on the right but was primarily in the bladder.  He subsequently had a tiny reoccurrence that was  resected.  He underwent cystoscope he in June 2019 when showed early noninvasive low-grade papillary urethral carcinoma. He has not been back to see him since   Prediabetic - Lab Results  Component Value Date   HGBA1C 6.3 04/23/2018    All immunizations and health maintenance protocols were reviewed with the patient and needed orders were placed. Needs Tdap and PNA vaccinations - refuses   Appropriate screening laboratory values were ordered for the patient including screening of hyperlipidemia, renal function and hepatic function. If indicated by BPH, a PSA was ordered.  Medication reconciliation,  past medical history, social history, problem list and allergies were reviewed in detail with the patient  Goals were established with regard to weight loss, exercise, and  diet in compliance with medications. He does not exercise due to chronic low back pain. He tries to eat healthy   Wt Readings from Last 3 Encounters:  12/08/18 167 lb (75.8 kg)  04/23/18 165 lb (74.8 kg)  11/16/17 163 lb (73.9 kg)   End of life planning was discussed.  He refuses to have a colonoscopy done despite knowing his risks.   Review of Systems  Constitutional: Negative.   HENT: Negative.   Eyes: Negative.   Respiratory: Positive for shortness of breath.   Cardiovascular: Negative.   Gastrointestinal: Negative.   Endocrine: Negative.   Genitourinary: Negative.   Musculoskeletal: Positive for arthralgias.  Skin: Negative.   Allergic/Immunologic: Negative.   Neurological: Negative.   Hematological: Negative.   Psychiatric/Behavioral: The patient is nervous/anxious.   All  other systems reviewed and are negative.  Past Medical History:  Diagnosis Date   Anxiety    Arthritis    and back problems   Cancer ([place])    Bladder Ca 2013, surgically removed   COPD (chronic obstructive pulmonary disease) ([place])    History of kidney stones    once  2013 had surgery   Hypertension    Shortness of  breath    with exertion    Social History   Socioeconomic History   Marital status: Married    Spouse name: Not on file   Number of children: 5   Years of education: 6   Highest education level: Not on file  Occupational History   Not on file  Social Needs   Financial resource strain: Not on file   Food insecurity    Worry: Not on file    Inability: Not on file   Transportation needs    Medical: Not on file    Non-medical: Not on file  Tobacco Use   Smoking status: Former Smoker    Packs/day: 1.00    Years: 50.00    Pack years: 50.00    Types: Cigarettes    Quit date: 09/21/2013    Years since quitting: 5.2   Smokeless tobacco: Never Used  Substance and Sexual Activity   Alcohol use: No   Drug use: No   Sexual activity: Yes    Birth control/protection: None  Lifestyle   Physical activity    Days per week: Not on file    Minutes per session: Not on file   Stress: Not on file  Relationships   Social connections    Talks on phone: Not on file    Gets together: Not on file    Attends religious service: Not on file    Active member of club or organization: Not on file    Attends meetings of clubs or organizations: Not on file    Relationship status: Not on file   Intimate partner violence    Fear of current or ex partner: Not on file    Emotionally abused: Not on file    Physically abused: Not on file    Forced sexual activity: Not on file  Other Topics Concern   Not on file  Social History Narrative   Worked as a Games developer - since retired.     Past Surgical History:  Procedure Laterality Date   BACK SURGERY     lower   cancer removed     x 2   TRANSURETHRAL RESECTION OF BLADDER TUMOR N/A 11/16/2017   Procedure: TRANSURETHRAL RESECTION OF BLADDER TUMOR (TURBT);  Surgeon: Lucas Mallow, MD;  Location: [facility];  Service: Urology;  Laterality: N/A;    Family History  Problem Relation Age of Onset   Cancer Brother    Cancer  Father    Cancer Sister     No Known Allergies  Current Outpatient Medications on File Prior to Visit  Medication Sig Dispense Refill   albuterol (PROVENTIL HFA;VENTOLIN HFA) 108 (90 Base) MCG/ACT inhaler Inhale 2 puffs into the lungs every 6 (six) hours as needed for wheezing or shortness of breath. 1 Inhaler 0   albuterol (PROVENTIL) (2.5 MG/3ML) 0.083% nebulizer solution Take 3 mLs (2.5 mg total) by nebulization every 6 (six) hours as needed for wheezing or shortness of breath. 75 mL 12   amLODipine (NORVASC) 10 MG tablet TAKE 1 TABLET(10 MG) BY MOUTH DAILY 90 tablet 0  atorvastatin (LIPITOR) 20 MG tablet TAKE 1 TABLET BY MOUTH BEFORE BEDTIME TO REDUCE CHOLESTEROL 90 tablet 3   BREO ELLIPTA 100-25 MCG/INH AEPB INHALE 1 PUFF INTO THE LUNGS DAILY 60 each 0   fluticasone furoate-vilanterol (BREO ELLIPTA) 100-25 MCG/INH AEPB Inhale 1 puff into the lungs daily. (Patient taking differently: Inhale 1 puff into the lungs daily as needed (shortness of breath). ) 1 each 0   ipratropium-albuterol (DUONEB) 0.5-2.5 (3) MG/3ML SOLN Take 3 mLs by nebulization every 6 (six) hours as needed. For shortness of breath 180 mL 3   ketoconazole (NIZORAL) 2 % cream Apply 1 application topically daily. 15 g 0   losartan (COZAAR) 100 MG tablet TAKE 1 TABLET BY MOUTH DAILY 90 tablet 0   tamsulosin (FLOMAX) 0.4 MG CAPS capsule Take 0.4mg  by mouth daily  6   No current facility-administered medications on file prior to visit.     BP 130/76    Temp 98.3 F (36.8 C)    Ht 5' 7.5" (1.715 m) Comment: WITH BOOTS   Wt 167 lb (75.8 kg)    BMI 25.77 kg/m        Objective:   Physical Exam Vitals signs and nursing note reviewed.  Constitutional:      General: He is not in acute distress.    Appearance: Normal appearance. He is well-developed. He is not diaphoretic.  HENT:     Head: Normocephalic and atraumatic.     Right Ear: Tympanic membrane, ear canal and external ear normal. There is no impacted  cerumen.     Left Ear: Tympanic membrane, ear canal and external ear normal. There is no impacted cerumen.     Nose: Nose normal. No congestion or rhinorrhea.     Mouth/Throat:     Mouth: Mucous membranes are moist.     Pharynx: Oropharynx is clear. No oropharyngeal exudate or posterior oropharyngeal erythema.  Eyes:     General: No scleral icterus.       Right eye: No discharge.        Left eye: No discharge.     Extraocular Movements: Extraocular movements intact.     Conjunctiva/sclera: Conjunctivae normal.     Pupils: Pupils are equal, round, and reactive to light.  Neck:     Musculoskeletal: No muscular tenderness.     Thyroid: No thyromegaly.     Vascular: No carotid bruit.     Trachea: No tracheal deviation.  Cardiovascular:     Rate and Rhythm: Normal rate and regular rhythm.     Heart sounds: Normal heart sounds. No murmur. No friction rub. No gallop.   Pulmonary:     Effort: Pulmonary effort is normal. No respiratory distress.     Breath sounds: No stridor. Wheezing (trace wheezing throughout ) present. No rhonchi or rales.  Chest:     Chest wall: No tenderness.  Abdominal:     General: Bowel sounds are normal. There is no distension.     Palpations: Abdomen is soft. There is no mass.     Tenderness: There is no abdominal tenderness. There is no right CVA tenderness, left CVA tenderness, guarding or rebound.     Hernia: No hernia is present.  Musculoskeletal: Normal range of motion.  Lymphadenopathy:     Cervical: No cervical adenopathy.  Skin:    General: Skin is warm and dry.     Capillary Refill: Capillary refill takes less than 2 seconds.     Coloration: Skin is not jaundiced or pale.  Findings: No bruising, erythema, lesion or rash.  Neurological:     General: No focal deficit present.     Mental Status: He is alert and oriented to person, place, and time. Mental status is at baseline.     Cranial Nerves: No cranial nerve deficit.     Sensory: No sensory  deficit.     Motor: No weakness.     Coordination: Coordination normal.     Gait: Gait normal.     Deep Tendon Reflexes: Reflexes normal.  Psychiatric:        Mood and Affect: Mood normal.        Behavior: Behavior normal.        Thought Content: Thought content normal.        Judgment: Judgment normal.        Assessment & Plan:  1. Routine general medical examination at a health care facility - Encouraged heart healthy diet and try and get some exercise  - Follow up in one year or sooner if needed - CBC with Differential/Platelet - Comprehensive metabolic panel - Hemoglobin A1c - Lipid panel - TSH  2. Hyperlipidemia, unspecified hyperlipidemia type - Consider dose adjustment of statin  - encouraged heart healthy diet  - CBC with Differential/Platelet - Comprehensive metabolic panel - Hemoglobin A1c - Lipid panel - TSH  3. Essential hypertension - No change in medications  - CBC with Differential/Platelet - Comprehensive metabolic panel - Hemoglobin A1c - Lipid panel - TSH  4. Pre-diabetes - Consider metformin  - CBC with Differential/Platelet - Comprehensive metabolic panel - Hemoglobin A1c - Lipid panel - TSH  5. Anxiety  - citalopram (CELEXA) 20 MG tablet; TAKE 1 TABLET(20 MG) BY MOUTH DAILY  Dispense: 90 tablet; Refill: 1  6. Malignant neoplasm of urinary bladder, unspecified site [facility]) - Strongly encouraged to follow up with Urology   7. Mild intermittent asthma without complication - Continue with inhalers and nebulizers. Needs follow up with Pulmonary. Will send in prednisone course for current flare - predniSONE (DELTASONE) 20 MG tablet; Take 1 tablet (20 mg total) by mouth daily with breakfast.  Dispense: 5 tablet; Refill: 0  8. Prostate cancer screening  - PSA  9. Neuropathy  - gabapentin (NEURONTIN) 300 MG capsule; TAKE 1 CAPSULE(300 MG) BY MOUTH TWICE DAILY  Dispense: 180 capsule; Refill: 1  Dorothyann Peng, NP

## 2018-12-08 NOTE — Patient Instructions (Signed)
It was great seeing you today   I have sent in your medications that are due and also sent in Prednisone to help with your breathing.   Please follow up with Urology - Dr. Gloriann Loan   We will follow up with you regarding your blood work

## 2019-01-04 ENCOUNTER — Other Ambulatory Visit: Payer: Self-pay | Admitting: Urology

## 2019-01-13 ENCOUNTER — Other Ambulatory Visit: Payer: Self-pay | Admitting: Adult Health

## 2019-01-13 DIAGNOSIS — I10 Essential (primary) hypertension: Secondary | ICD-10-CM

## 2019-01-13 NOTE — Telephone Encounter (Signed)
Sent to the pharmacy by e-scribe. 

## 2019-01-26 NOTE — Patient Instructions (Addendum)
YOU NEED TO HAVE A COVID 19 TEST ON___8-22-20____ @_______ , THIS TEST MUST BE DONE BEFORE SURGERY, COME  [place] , [address]. ONCE YOUR COVID TEST IS COMPLETED, PLEASE BEGIN THE QUARANTINE INSTRUCTIONS AS OUTLINED IN YOUR HANDOUT.                Belt    Your procedure is scheduled on: 02-02-2019  Report to [facility] Main  Entrance              Report to admitting at 800 AM   1 VISITOR IS ALLOWED TO WAIT IN WAITING ROOM  ONLY DAY OF YOUR SURGERY. NO VISITORS ARE ALLOWED IN SHORT STAY OR RECOVERY ROOM.   Call this number if you have problems the morning of surgery [phone]    Remember: Do not eat food or drink liquids :After Midnight. BRUSH YOUR TEETH MORNING OF SURGERY AND RINSE YOUR MOUTH OUT, NO CHEWING GUM CANDY OR MINTS.     Take these medicines the morning of surgery with A SIP OF WATER: flomax, nebulizer if needed, gabapentin if needed, inhaler and bring with you, amlodipine                                You may not have any metal on your body including hair pins and              piercings  Do not wear jewelry,  lotions, powders or perfumes, deodorant                       Men may shave face and neck.   Do not bring valuables to the hospital. Colon.  Contacts, dentures or bridgework may not be worn into surgery.      Patients discharged the day of surgery will not be allowed to drive home. IF YOU ARE HAVING SURGERY AND GOING HOME THE SAME DAY, YOU MUST HAVE AN ADULT TO DRIVE YOU HOME AND BE WITH YOU FOR 24 HOURS. YOU MAY GO HOME BY TAXI OR UBER OR ORTHERWISE, BUT AN ADULT MUST ACCOMPANY YOU HOME AND STAY WITH YOU FOR 24 HOURS.  Name and phone number of your driver:  Special Instructions: N/A              Please read over the following fact sheets you were given: _____________________________________________________________________             [facility] -  Preparing for Surgery Before surgery, you can play an important role.  Because skin is not sterile, your skin needs to be as free of germs as possible.  You can reduce the number of germs on your skin by washing with CHG (chlorahexidine gluconate) soap before surgery.  CHG is an antiseptic cleaner which kills germs and bonds with the skin to continue killing germs even after washing. Please DO NOT use if you have an allergy to CHG or antibacterial soaps.  If your skin becomes reddened/irritated stop using the CHG and inform your nurse when you arrive at Short Stay. Do not shave (including legs and underarms) for at least 48 hours prior to the first CHG shower.  You may shave your face/neck. Please follow these instructions carefully:  1.  Shower with CHG Soap the night before  surgery and the  morning of Surgery.  2.  If you choose to wash your hair, wash your hair first as usual with your  normal  shampoo.  3.  After you shampoo, rinse your hair and body thoroughly to remove the  shampoo.                           4.  Use CHG as you would any other liquid soap.  You can apply chg directly  to the skin and wash                       Gently with a scrungie or clean washcloth.  5.  Apply the CHG Soap to your body ONLY FROM THE NECK DOWN.   Do not use on face/ open                           Wound or open sores. Avoid contact with eyes, ears mouth and genitals (private parts).                       Wash face,  Genitals (private parts) with your normal soap.             6.  Wash thoroughly, paying special attention to the area where your surgery  will be performed.  7.  Thoroughly rinse your body with warm water from the neck down.  8.  DO NOT shower/wash with your normal soap after using and rinsing off  the CHG Soap.                9.  Pat yourself dry with a clean towel.            10.  Wear clean pajamas.            11.  Place clean sheets on your bed the night of your first shower and do not  sleep  with pets. Day of Surgery : Do not apply any lotions/deodorants the morning of surgery.  Please wear clean clothes to the hospital/surgery center.  FAILURE TO FOLLOW THESE INSTRUCTIONS MAY RESULT IN THE CANCELLATION OF YOUR SURGERY PATIENT SIGNATURE_________________________________  NURSE SIGNATURE__________________________________  ________________________________________________________________________

## 2019-01-27 ENCOUNTER — Other Ambulatory Visit: Payer: Self-pay

## 2019-01-27 ENCOUNTER — Encounter (HOSPITAL_COMMUNITY)
Admission: RE | Admit: 2019-01-27 | Discharge: 2019-01-27 | Disposition: A | Discharge status: Home / Self Care | Payer: Medicare Other | Source: Ambulatory Visit | Attending: Urology | Admitting: Urology

## 2019-01-27 ENCOUNTER — Encounter (INDEPENDENT_AMBULATORY_CARE_PROVIDER_SITE_OTHER): Payer: Self-pay

## 2019-01-27 ENCOUNTER — Encounter (HOSPITAL_COMMUNITY): Payer: Self-pay

## 2019-01-27 DIAGNOSIS — Z20828 Contact with and (suspected) exposure to other viral communicable diseases: Secondary | ICD-10-CM | POA: Diagnosis not present

## 2019-01-27 DIAGNOSIS — C679 Malignant neoplasm of bladder, unspecified: Secondary | ICD-10-CM | POA: Insufficient documentation

## 2019-01-27 DIAGNOSIS — Z01818 Encounter for other preprocedural examination: Secondary | ICD-10-CM | POA: Insufficient documentation

## 2019-01-27 LAB — CBC
HCT: 46 % (ref 39.0–52.0)
Hemoglobin: 14.9 g/dL (ref 13.0–17.0)
MCH: 29.4 pg (ref 26.0–34.0)
MCHC: 32.4 g/dL (ref 30.0–36.0)
MCV: 90.9 fL (ref 80.0–100.0)
Platelets: 422 10*3/uL — ABNORMAL HIGH (ref 150–400)
RBC: 5.06 MIL/uL (ref 4.22–5.81)
RDW: 16.2 % — ABNORMAL HIGH (ref 11.5–15.5)
WBC: 9.6 10*3/uL (ref 4.0–10.5)
nRBC: 0 % (ref 0.0–0.2)

## 2019-01-27 LAB — BASIC METABOLIC PANEL
Anion gap: 8 (ref 5–15)
BUN: 21 mg/dL (ref 8–23)
CO2: 25 mmol/L (ref 22–32)
Calcium: 9.4 mg/dL (ref 8.9–10.3)
Chloride: 105 mmol/L (ref 98–111)
Creatinine, Ser: 1.29 mg/dL — ABNORMAL HIGH (ref 0.61–1.24)
GFR calc Af Amer: >60 mL/min (ref >60)
GFR calc non Af Amer: 54 mL/min — ABNORMAL LOW (ref >60)
Glucose, Bld: 108 mg/dL — ABNORMAL HIGH (ref 70–99)
Potassium: 3.9 mmol/L (ref 3.5–5.1)
Sodium: 138 mmol/L (ref 135–145)

## 2019-01-29 ENCOUNTER — Other Ambulatory Visit (HOSPITAL_COMMUNITY)
Admission: RE | Admit: 2019-01-29 | Discharge: 2019-01-29 | Disposition: A | Discharge status: Home / Self Care | Payer: Medicare Other | Source: Ambulatory Visit | Attending: Urology | Admitting: Urology

## 2019-01-29 DIAGNOSIS — Z01818 Encounter for other preprocedural examination: Secondary | ICD-10-CM | POA: Diagnosis not present

## 2019-01-29 LAB — SARS CORONAVIRUS 2 (TAT 6-24 HRS): SARS Coronavirus 2: NEGATIVE

## 2019-02-02 ENCOUNTER — Other Ambulatory Visit: Payer: Self-pay

## 2019-02-02 ENCOUNTER — Ambulatory Visit (HOSPITAL_COMMUNITY): Payer: Medicare Other | Admitting: Physician Assistant

## 2019-02-02 ENCOUNTER — Ambulatory Visit (HOSPITAL_COMMUNITY)
Admission: RE | Admit: 2019-02-02 | Discharge: 2019-02-02 | Disposition: A | Discharge status: Home / Self Care | Payer: Medicare Other | Attending: Urology | Admitting: Urology

## 2019-02-02 ENCOUNTER — Encounter (HOSPITAL_COMMUNITY): Payer: Self-pay | Admitting: Emergency Medicine

## 2019-02-02 ENCOUNTER — Encounter (HOSPITAL_COMMUNITY)
Admission: RE | Disposition: A | Discharge status: Home / Self Care | Payer: Self-pay | Source: Home / Self Care | Attending: Urology

## 2019-02-02 ENCOUNTER — Ambulatory Visit (HOSPITAL_COMMUNITY): Payer: Medicare Other | Admitting: Anesthesiology

## 2019-02-02 DIAGNOSIS — Z87891 Personal history of nicotine dependence: Secondary | ICD-10-CM | POA: Insufficient documentation

## 2019-02-02 DIAGNOSIS — N200 Calculus of kidney: Secondary | ICD-10-CM | POA: Insufficient documentation

## 2019-02-02 DIAGNOSIS — C674 Malignant neoplasm of posterior wall of bladder: Secondary | ICD-10-CM | POA: Diagnosis not present

## 2019-02-02 DIAGNOSIS — D494 Neoplasm of unspecified behavior of bladder: Secondary | ICD-10-CM | POA: Diagnosis present

## 2019-02-02 DIAGNOSIS — J439 Emphysema, unspecified: Secondary | ICD-10-CM | POA: Diagnosis not present

## 2019-02-02 DIAGNOSIS — Z79899 Other long term (current) drug therapy: Secondary | ICD-10-CM | POA: Diagnosis not present

## 2019-02-02 DIAGNOSIS — Z7951 Long term (current) use of inhaled steroids: Secondary | ICD-10-CM | POA: Insufficient documentation

## 2019-02-02 DIAGNOSIS — N281 Cyst of kidney, acquired: Secondary | ICD-10-CM | POA: Insufficient documentation

## 2019-02-02 DIAGNOSIS — I700 Atherosclerosis of aorta: Secondary | ICD-10-CM | POA: Insufficient documentation

## 2019-02-02 HISTORY — PX: TRANSURETHRAL RESECTION OF BLADDER TUMOR WITH MITOMYCIN-C: SHX6459

## 2019-02-02 SURGERY — TRANSURETHRAL RESECTION OF BLADDER TUMOR WITH MITOMYCIN-C
Anesthesia: General | Site: Bladder

## 2019-02-02 MED ORDER — FENTANYL CITRATE (PF) 100 MCG/2ML IJ SOLN
INTRAMUSCULAR | Status: DC | PRN
  Administered 2019-02-02 (×2): 50 ug via INTRAVENOUS

## 2019-02-02 MED ORDER — HYDROMORPHONE HCL 1 MG/ML IJ SOLN
0.2500 mg | INTRAMUSCULAR | Status: DC | PRN
  Administered 2019-02-02 (×4): 0.5 mg via INTRAVENOUS

## 2019-02-02 MED ORDER — SODIUM CHLORIDE 0.9 % IR SOLN
Status: DC | PRN
  Administered 2019-02-02: 3000 mL

## 2019-02-02 MED ORDER — ONDANSETRON HCL 4 MG/2ML IJ SOLN: 4.0000 mg | Freq: Once | INTRAMUSCULAR | Status: DC | PRN

## 2019-02-02 MED ORDER — GEMCITABINE CHEMO FOR BLADDER INSTILLATION 2000 MG
2000.0000 mg | Freq: Once | INTRAVENOUS | Status: AC
  Administered 2019-02-02: 11:00:00 2000 mg via INTRAVESICAL
  Filled 2019-02-02: qty 2000

## 2019-02-02 MED ORDER — ONDANSETRON HCL 4 MG/2ML IJ SOLN
INTRAMUSCULAR | Status: AC
  Filled 2019-02-02: qty 2

## 2019-02-02 MED ORDER — DEXAMETHASONE SODIUM PHOSPHATE 10 MG/ML IJ SOLN
INTRAMUSCULAR | Status: AC
  Filled 2019-02-02: qty 1

## 2019-02-02 MED ORDER — MEPERIDINE HCL 50 MG/ML IJ SOLN: 6.2500 mg | INTRAMUSCULAR | Status: DC | PRN

## 2019-02-02 MED ORDER — ONDANSETRON HCL 4 MG/2ML IJ SOLN
INTRAMUSCULAR | Status: DC | PRN
  Administered 2019-02-02: 4 mg via INTRAVENOUS

## 2019-02-02 MED ORDER — HYDROMORPHONE HCL 1 MG/ML IJ SOLN
INTRAMUSCULAR | Status: AC
  Filled 2019-02-02: qty 1

## 2019-02-02 MED ORDER — LIDOCAINE 2% (20 MG/ML) 5 ML SYRINGE
INTRAMUSCULAR | Status: DC | PRN
  Administered 2019-02-02: 100 mg via INTRAVENOUS

## 2019-02-02 MED ORDER — LIDOCAINE 2% (20 MG/ML) 5 ML SYRINGE
INTRAMUSCULAR | Status: AC
  Filled 2019-02-02: qty 5

## 2019-02-02 MED ORDER — FENTANYL CITRATE (PF) 100 MCG/2ML IJ SOLN
INTRAMUSCULAR | Status: AC
  Filled 2019-02-02: qty 2

## 2019-02-02 MED ORDER — CEFAZOLIN SODIUM-DEXTROSE 2-4 GM/100ML-% IV SOLN
2.0000 g | INTRAVENOUS | Status: AC
  Administered 2019-02-02: 2 g via INTRAVENOUS
  Filled 2019-02-02: qty 100

## 2019-02-02 MED ORDER — PROPOFOL 10 MG/ML IV BOLUS
INTRAVENOUS | Status: AC
  Filled 2019-02-02: qty 20

## 2019-02-02 MED ORDER — FENTANYL CITRATE (PF) 100 MCG/2ML IJ SOLN
25.0000 ug | INTRAMUSCULAR | Status: DC | PRN
  Administered 2019-02-02 (×3): 50 ug via INTRAVENOUS

## 2019-02-02 MED ORDER — PROPOFOL 10 MG/ML IV BOLUS
INTRAVENOUS | Status: DC | PRN
  Administered 2019-02-02: 30 mg via INTRAVENOUS
  Administered 2019-02-02: 120 mg via INTRAVENOUS

## 2019-02-02 MED ORDER — LACTATED RINGERS IV SOLN
INTRAVENOUS | Status: DC
  Administered 2019-02-02: 08:00:00 via INTRAVENOUS

## 2019-02-02 SURGICAL SUPPLY — 15 items
BAG URINE DRAINAGE (UROLOGICAL SUPPLIES) IMPLANT
BAG URO CATCHER STRL LF (MISCELLANEOUS) ×3 IMPLANT
COVER WAND RF STERILE (DRAPES) IMPLANT
ELECT REM PT RETURN 15FT ADLT (MISCELLANEOUS) ×3 IMPLANT
GLOVE BIO SURGEON STRL SZ7.5 (GLOVE) ×3 IMPLANT
GOWN STRL REUS W/TWL LRG LVL3 (GOWN DISPOSABLE) ×3 IMPLANT
KIT TURNOVER KIT A (KITS) IMPLANT
LOOP CUT BIPOLAR 24F LRG (ELECTROSURGICAL) IMPLANT
MANIFOLD NEPTUNE II (INSTRUMENTS) ×3 IMPLANT
PACK CYSTO (CUSTOM PROCEDURE TRAY) ×3 IMPLANT
SET ASPIRATION TUBING (TUBING) IMPLANT
SYRINGE IRR TOOMEY STRL 70CC (SYRINGE) IMPLANT
TUBING CONNECTING 10 (TUBING) ×2 IMPLANT
TUBING CONNECTING 10' (TUBING) ×1
TUBING UROLOGY SET (TUBING) ×3 IMPLANT

## 2019-02-02 NOTE — Op Note (Signed)
Operative Note  Preoperative diagnosis:  1.  Bladder tumor  Postoperative diagnosis: 1.  Bladder tumor  Procedure(s): 1.  Transurethral resection of bladder tumor--small 2.  Intravesical instillation of chemotherapy--gemcitabine  Surgeon: Link Snuffer, MD  Assistants: None  Anesthesia: General  Complications: None immediate  EBL: Minimal  Specimens: 1.  Bladder tumor  Drains/Catheters: 1.  18 French urethral catheter  Intraoperative findings: 1.  Normal urethra 2.  1 cm bladder tumor on the posterior bladder wall to the left completely resected.  There was a subcentimeter lesion that was fulgurated on the right posterior wall.  Bilateral ureteral orifices were not involved.  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  Description of procedure:  The patient was identified and consent was obtained.  The patient was taken to the operating room and placed in the supine position.  The patient was placed under general anesthesia.  Perioperative antibiotics were administered.  The patient was placed in dorsal lithotomy.  Patient was prepped and draped in a standard sterile fashion and a timeout was performed.  A 26 French resectoscope with a visual obturator in place was advanced into the urethra and into the bladder.  This was exchanged for the resectoscope component and on bipolar settings, the tumor was completely resected.  The other spot was fulgurated.  Tumor was collected for specimen.  I fulgurated the resection bed.  There was no active bleeding.  There were no other lesions.  I drained the bladder and withdrew the scope.  An 4 French urethral catheter was placed.  This conclude the operation.  The patient tolerated the procedure well.  In the PACU, gemcitabine was instilled into the bladder where it remained for approximately 1 hour prior to proper disposal.  Plan: Return in 1  week for pathology review.

## 2019-02-02 NOTE — Anesthesia Preprocedure Evaluation (Signed)
Anesthesia Evaluation  Patient identified by MRN, date of birth, ID band Patient awake    Reviewed: Allergy & Precautions, H&P , NPO status , Patient's Chart, lab work & pertinent test results, reviewed documented beta blocker date and time   Airway Mallampati: II  TM Distance: >3 FB Neck ROM: full    Dental  (+) Dental Advisory Given   Pulmonary asthma , COPD, former smoker,    Pulmonary exam normal breath sounds clear to auscultation       Cardiovascular Exercise Tolerance: Good hypertension, Pt. on medications negative cardio ROS   Rhythm:regular Rate:Normal     Neuro/Psych  Headaches, PSYCHIATRIC DISORDERS Anxiety  Neuromuscular disease    GI/Hepatic negative GI ROS, GERD  ,(+)     substance abuse  alcohol use,   Endo/Other    Renal/GU      Musculoskeletal  (+) Arthritis , Osteoarthritis,    Abdominal   Peds  Hematology   Anesthesia Other Findings   Reproductive/Obstetrics negative OB ROS                             Anesthesia Physical  Anesthesia Plan  ASA: [name]  Anesthesia Plan: General   Post-op Pain Management:    Induction: Intravenous  PONV Risk Score and Plan: 2 and Ondansetron, Treatment may vary due to age or medical condition and Dexamethasone  Airway Management Planned: LMA and Oral ETT  Additional Equipment: None  Intra-op Plan:   Post-operative Plan: Extubation in OR  Informed Consent: I have reviewed the patients History and Physical, chart, labs and discussed the procedure including the risks, benefits and alternatives for the proposed anesthesia with the patient or authorized representative who has indicated his/her understanding and acceptance.     Dental advisory given  Plan Discussed with: CRNA  Anesthesia Plan Comments: ( )        Anesthesia Quick Evaluation

## 2019-02-02 NOTE — Anesthesia Procedure Notes (Signed)
Procedure Name: LMA Insertion Date/Time: 02/02/2019 10:01 AM Performed by: Claudia Desanctis, CRNA Pre-anesthesia Checklist: Emergency Drugs available, Patient identified, Suction available and Patient being monitored Patient Re-evaluated:Patient Re-evaluated prior to induction Oxygen Delivery Method: Circle system utilized Preoxygenation: Pre-oxygenation with 100% oxygen Induction Type: IV induction Ventilation: Mask ventilation without difficulty LMA: LMA inserted LMA Size: 4.0 Number of attempts: 1 Placement Confirmation: positive ETCO2 and breath sounds checked- equal and bilateral Tube secured with: Tape Dental Injury: Teeth and Oropharynx as per pre-operative assessment

## 2019-02-02 NOTE — Discharge Instructions (Signed)

## 2019-02-02 NOTE — H&P (Signed)
CC/HPI: CC: History of bladder cancer  HPI:  10/06/17  Previous patient of Dr. Karsten Ro. He has not followed up since 2016 because he dislikes having to undergo cystoscopy. He has a history of low-grade TA bladder cancer and has had 2 resections in the past for this. The first was discovered incidentally during ureteroscopy for a ureteral calculus. He had a low-grade tumor that involved the distal most portion of the ureteral orifice on the right but was primarily in the bladder. He subsequently had a tiny recurrence that was resected. He also complains about lower urinary tract symptoms, most bothersome complaint is nocturia 2. He does have a weaker stream as well. He did have some dysuria but this improved after taking gabapentin. Denies current hematuria or dysuria. Urinalysis is negative today.   11/06/17  Patient underwent a CT IVP which revealed the following:   IMPRESSION:  1. No evidence of recurrent or metastatic bladder cancer.  2. Bilateral renal cysts. No evidence of renal mass.  3. Nonobstructing bilateral renal calculi. No evidence of ureteral  calculus or hydronephrosis.  4. Aortic Atherosclerosis (ICD10-I70.0) and Emphysema (ICD10-J43.9).   Patient presents today for cystoscopy and rule out bladder cancer recurrence.   12/09/2018  The patient states that he just didn't want a follow-up after his TURBT. Therefore, he has been lost to follow-up. 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 For the past couple months, the patient has been experiencing pain with urination and suprapubic pain. He refuses Prostate exam today.   01/03/2019  Patient presents today for cystoscopy     ALLERGIES: No Allergies    MEDICATIONS: Doxycycline Hyclate 100 mg tablet 1 tablet PO BID  Tamsulosin Hcl 0.4 mg capsule 1 capsule PO Daily  Alprazolam 0.25 mg tablet 0 Oral  Amlodipine Besylate 10 mg tablet  Citalopram Hbr 20 mg tablet  Gabapentin 300 mg capsule   Losartan Potassium 100 mg tablet  Trelegy Ellipta 100 mcg-62.5 mcg-25 mcg/actuation blister, with inhalation device     GU PSH: Cystoscopy - 11/06/2017 Cystoscopy TURBT <2 cm - 2008 Cystoscopy TURBT 2-5 cm - 11/16/2017 Locm 300-399Mg /Ml Iodine,1Ml - 10/14/2017       PSH Notes: Inguinal Hernia Repair, Cystoscopy With Fulguration Small Lesion (5-45mm), Splenectomy, Back Surgery, Lung Surgery   NON-GU PSH: Lung Surgery (Unspecified) - 2008 Splenectomy - 2008     GU PMH: Chronic prostatitis - 12/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 - 10/06/2017 Nocturia - 10/06/2017 Weak Urinary Stream - 10/06/2017 BPH w/o LUTS, Benign enlargement of prostate - 2016 Bladder Cancer, Unspec, Transitional cell carcinoma of bladder - 2016 Dysuria, Dysuria - 2016 Ureteral calculus, Ureteral calculus, left - 2016 Renal calculus, Bilateral kidney stones - 2016 Encounter for Prostate Cancer screening, Prostate cancer screening - 2015 History of bladder cancer, Bladder Cancer - 2014 Renal cyst, Renal cysts, acquired, bilateral - 2014    NON-GU PMH: Encounter for general adult medical examination without abnormal findings, Encounter for preventive health examination - 2016 Personal history of other diseases of the respiratory system, History of chronic obstructive lung disease - 2015 Unspecified thoracic, thoracolumbar and lumbosacral intervertebral disc disorder, Bulging disc - 2015 Anxiety, Anxiety (Symptom) - 2014 Personal history of other diseases of the circulatory system, History of hypertension - 2014 COPD    FAMILY HISTORY: Cancer - Brother, Father Father Deceased At Age59 ___ - Runs In Family Heart Disease - Mother Mother Deceased At Age 66 from diabetic complicati - Runs In Family nephrolithiasis - Daughter   SOCIAL HISTORY: Marital Status:  Married Preferred Language: English; Ethnicity: Not Hispanic Or Latino; Race: White Current Smoking Status: Patient does not smoke anymore. Has not smoked since 09/07/2012.    Tobacco Use Assessment Completed: Used Tobacco in last 30 days? Has never drank.  Drinks 1 caffeinated drink per day.     Notes: Former smoker, Current every day smoker, Alcohol Use, Caffeine Use, Current Every Day Smoker, Occupation:, Marital History - Currently Married, Drug Use   REVIEW OF SYSTEMS:    GU Review Male:   Patient denies frequent urination, hard to postpone urination, burning/ pain with urination, get up at night to urinate, leakage of urine, stream starts and stops, trouble starting your stream, have to strain to urinate , erection problems, and penile pain.  Gastrointestinal (Upper):   Patient denies nausea, vomiting, and indigestion/ heartburn.  Gastrointestinal (Lower):   Patient denies diarrhea and constipation.  Constitutional:   Patient denies fatigue, fever, weight loss, and night sweats.  Skin:   Patient denies skin rash/ lesion and itching.  Eyes:   Patient denies blurred vision and double vision.  Ears/ Nose/ Throat:   Patient denies sore throat and sinus problems.  Hematologic/Lymphatic:   Patient denies swollen glands and easy bruising.  Cardiovascular:   Patient denies leg swelling and chest pains.  Respiratory:   Patient denies cough and shortness of breath.  Endocrine:   Patient denies excessive thirst.  Musculoskeletal:   Patient denies back pain and joint pain.  Neurological:   Patient denies headaches and dizziness.  Psychologic:   Patient denies depression and anxiety.   VITAL SIGNS:      01/03/2019 08:29 AM  BP 161/79 mmHg  Heart Rate 80 /min  Temperature 98.4 F / 36.8 C   PAST DATA REVIEWED:  Source Of History:  Patient   12/28/12 11/27/10 06/15/08 05/14/07 09/13/03  PSA  Total PSA 1.30  1.33  0.85  0.84  0.54     PROCEDURES:         Flexible Cystoscopy - 52000  Risks, benefits, and some of the potential complications of the procedure were discussed at length with the patient including infection, bleeding, voiding discomfort, urinary  retention, fever, chills, sepsis, and others. All questions were answered. Informed consent was obtained. Antibiotic prophylaxis was given. Sterile technique and intraurethral analgesia were used.  Meatus:  Normal size. Normal location. Normal condition.  Urethra:  No strictures.  External Sphincter:  Normal.  Verumontanum:  Normal.  Prostate:  Borderline obstructing. Mild hyperplasia.  Bladder Neck:  Non-obstructing.  Ureteral Orifices:  Normal location. Normal size. Normal shape.   Bladder:  2 Small bladder tumors. Both located on the posterior wall. One was subcentimeter and the other was about 1 cm.      The lower urinary tract was carefully examined. The procedure was well-tolerated and without complications. Antibiotic instructions were given. Instructions were given to call the office immediately for bloody urine, difficulty urinating, urinary retention, painful or frequent urination, fever, chills, nausea, vomiting or other illness. The patient stated that he understood these instructions and would comply with them.         Urinalysis Dipstick Dipstick Cont'd  Color: Yellow Bilirubin: Neg mg/dL  Appearance: Clear Ketones: Neg mg/dL  Specific Gravity: 1.025 Blood: Neg ery/uL  pH: 6.0 Protein: Trace mg/dL  Glucose: Neg mg/dL Urobilinogen: 0.2 mg/dL    Nitrites: Neg    Leukocyte Esterase: Neg leu/uL    ASSESSMENT:      ICD-10 Details  1 GU:   Bladder Cancer,  Unspec - C67.9      PLAN:           Document Letter(s):  Created for Patient: Clinical Summary         Notes:   Proceed with TURBT with instillation of gemcitabine. Risks and benefits discussed. These include but are not limited to bleeding, infection, injury to surrounding structures including bladder perforation, complications of intravesical chemotherapy.   Cc: Dorothyann Peng, NP   Signed by Link Snuffer, [name], M.D. on 01/03/19 at 9:12 AM (EDT

## 2019-02-02 NOTE — Progress Notes (Signed)
Patients daughter Charleston Ropes called back to PACU after discharge. States patient feels like he needs to void but cant, and vomited X 1. Informed daughter that patient voided without difficulty before discharge. RN gave patients daughter telephone number for [facility] and instructed to call since patient felt he needed to void and could not. Patient daughter stated she would call [facility] after speaking with me.

## 2019-02-02 NOTE — Transfer of Care (Signed)
Immediate Anesthesia Transfer of Care Note  Patient: Connor Delbert Phenix Sr.  Procedure(s) Performed: TRANSURETHRAL RESECTION OF BLADDER TUMOR WITH GEMCITABINE (N/A Bladder)  Patient Location: PACU  Anesthesia Type:General  Level of Consciousness: awake, alert , oriented and patient cooperative  Airway & Oxygen Therapy: Patient Spontanous Breathing and Patient connected to face mask  Post-op Assessment: Report given to RN and Post -op Vital signs reviewed and stable  Post vital signs: Reviewed and stable  Last Vitals:  Vitals Value Taken Time  BP    Temp    Pulse    Resp    SpO2      Last Pain:  Vitals:   02/02/19 0826  TempSrc:   PainSc: 0-No pain      Patients Stated Pain Goal: 4 ([id number])  Complications: No apparent anesthesia complications

## 2019-02-03 ENCOUNTER — Encounter (HOSPITAL_COMMUNITY): Payer: Self-pay | Admitting: Urology

## 2019-02-03 NOTE — Anesthesia Postprocedure Evaluation (Signed)
Anesthesia Post Note  Patient: Connor Pontiff Sr.  Procedure(s) Performed: TRANSURETHRAL RESECTION OF BLADDER TUMOR WITH GEMCITABINE (N/A Bladder)     Patient location during evaluation: PACU Anesthesia Type: General Level of consciousness: sedated and patient cooperative Pain management: pain level controlled Vital Signs Assessment: post-procedure vital signs reviewed and stable Respiratory status: spontaneous breathing Cardiovascular status: stable Anesthetic complications: no    Last Vitals:  Vitals:   02/02/19 1200 02/02/19 1224  BP: (!) 150/70 (!) 159/86  Pulse: 78 79  Resp: 16 16  Temp: (!) 36.4 C 36.5 C  SpO2: 94% 93%    Last Pain:  Vitals:   02/02/19 1200  TempSrc:   PainSc: [place]

## 2019-02-14 ENCOUNTER — Emergency Department (HOSPITAL_COMMUNITY): Payer: Medicare Other

## 2019-02-14 ENCOUNTER — Encounter (HOSPITAL_COMMUNITY): Payer: Self-pay | Admitting: Emergency Medicine

## 2019-02-14 ENCOUNTER — Inpatient Hospital Stay (HOSPITAL_COMMUNITY): Payer: Medicare Other

## 2019-02-14 ENCOUNTER — Inpatient Hospital Stay (HOSPITAL_COMMUNITY)
Admission: EM | Admit: 2019-02-14 | Discharge: 2019-02-18 | DRG: 872 | Disposition: A | Discharge status: Home / Self Care | Payer: Medicare Other | Attending: Internal Medicine | Admitting: Internal Medicine

## 2019-02-14 ENCOUNTER — Other Ambulatory Visit: Payer: Self-pay

## 2019-02-14 DIAGNOSIS — R072 Precordial pain: Secondary | ICD-10-CM

## 2019-02-14 DIAGNOSIS — E872 Acidosis, unspecified: Secondary | ICD-10-CM

## 2019-02-14 DIAGNOSIS — R778 Other specified abnormalities of plasma proteins: Secondary | ICD-10-CM

## 2019-02-14 DIAGNOSIS — I129 Hypertensive chronic kidney disease with stage 1 through stage 4 chronic kidney disease, or unspecified chronic kidney disease: Secondary | ICD-10-CM | POA: Diagnosis present

## 2019-02-14 DIAGNOSIS — Z79899 Other long term (current) drug therapy: Secondary | ICD-10-CM | POA: Diagnosis not present

## 2019-02-14 DIAGNOSIS — F419 Anxiety disorder, unspecified: Secondary | ICD-10-CM | POA: Diagnosis present

## 2019-02-14 DIAGNOSIS — R7989 Other specified abnormal findings of blood chemistry: Secondary | ICD-10-CM

## 2019-02-14 DIAGNOSIS — Z9841 Cataract extraction status, right eye: Secondary | ICD-10-CM

## 2019-02-14 DIAGNOSIS — Z87891 Personal history of nicotine dependence: Secondary | ICD-10-CM | POA: Diagnosis not present

## 2019-02-14 DIAGNOSIS — J449 Chronic obstructive pulmonary disease, unspecified: Secondary | ICD-10-CM | POA: Diagnosis present

## 2019-02-14 DIAGNOSIS — N179 Acute kidney failure, unspecified: Secondary | ICD-10-CM | POA: Diagnosis present

## 2019-02-14 DIAGNOSIS — N3090 Cystitis, unspecified without hematuria: Secondary | ICD-10-CM

## 2019-02-14 DIAGNOSIS — Z23 Encounter for immunization: Secondary | ICD-10-CM | POA: Diagnosis present

## 2019-02-14 DIAGNOSIS — N183 Chronic kidney disease, stage 3 (moderate): Secondary | ICD-10-CM | POA: Diagnosis present

## 2019-02-14 DIAGNOSIS — Z7951 Long term (current) use of inhaled steroids: Secondary | ICD-10-CM

## 2019-02-14 DIAGNOSIS — E860 Dehydration: Secondary | ICD-10-CM | POA: Diagnosis present

## 2019-02-14 DIAGNOSIS — E876 Hypokalemia: Secondary | ICD-10-CM | POA: Diagnosis present

## 2019-02-14 DIAGNOSIS — E871 Hypo-osmolality and hyponatremia: Secondary | ICD-10-CM

## 2019-02-14 DIAGNOSIS — M62838 Other muscle spasm: Secondary | ICD-10-CM | POA: Diagnosis present

## 2019-02-14 DIAGNOSIS — Z9842 Cataract extraction status, left eye: Secondary | ICD-10-CM

## 2019-02-14 DIAGNOSIS — Z66 Do not resuscitate: Secondary | ICD-10-CM | POA: Diagnosis present

## 2019-02-14 DIAGNOSIS — Z20828 Contact with and (suspected) exposure to other viral communicable diseases: Secondary | ICD-10-CM | POA: Diagnosis present

## 2019-02-14 DIAGNOSIS — I248 Other forms of acute ischemic heart disease: Secondary | ICD-10-CM | POA: Diagnosis present

## 2019-02-14 DIAGNOSIS — A419 Sepsis, unspecified organism: Principal | ICD-10-CM

## 2019-02-14 DIAGNOSIS — Z8551 Personal history of malignant neoplasm of bladder: Secondary | ICD-10-CM

## 2019-02-14 DIAGNOSIS — N12 Tubulo-interstitial nephritis, not specified as acute or chronic: Secondary | ICD-10-CM | POA: Diagnosis present

## 2019-02-14 DIAGNOSIS — M1990 Unspecified osteoarthritis, unspecified site: Secondary | ICD-10-CM | POA: Diagnosis present

## 2019-02-14 DIAGNOSIS — Z87442 Personal history of urinary calculi: Secondary | ICD-10-CM | POA: Diagnosis not present

## 2019-02-14 LAB — COMPREHENSIVE METABOLIC PANEL
ALT: 54 U/L — ABNORMAL HIGH (ref 0–44)
AST: 74 U/L — ABNORMAL HIGH (ref 15–41)
Albumin: 3.2 g/dL — ABNORMAL LOW (ref 3.5–5.0)
Alkaline Phosphatase: 146 U/L — ABNORMAL HIGH (ref 38–126)
Anion gap: 15 (ref 5–15)
BUN: 14 mg/dL (ref 8–23)
CO2: 16 mmol/L — ABNORMAL LOW (ref 22–32)
Calcium: 8.6 mg/dL — ABNORMAL LOW (ref 8.9–10.3)
Chloride: 99 mmol/L (ref 98–111)
Creatinine, Ser: 1.5 mg/dL — ABNORMAL HIGH (ref 0.61–1.24)
GFR calc Af Amer: 52 mL/min — ABNORMAL LOW (ref >60)
GFR calc non Af Amer: 45 mL/min — ABNORMAL LOW (ref >60)
Glucose, Bld: 119 mg/dL — ABNORMAL HIGH (ref 70–99)
Potassium: 3.6 mmol/L (ref 3.5–5.1)
Sodium: 130 mmol/L — ABNORMAL LOW (ref 135–145)
Total Bilirubin: 1.2 mg/dL (ref 0.3–1.2)
Total Protein: 6.7 g/dL (ref 6.5–8.1)

## 2019-02-14 LAB — PROTIME-INR
INR: 1.2 (ref 0.8–1.2)
Prothrombin Time: 14.6 seconds (ref 11.4–15.2)

## 2019-02-14 LAB — CBC WITH DIFFERENTIAL/PLATELET
Abs Immature Granulocytes: 0.33 10*3/uL — ABNORMAL HIGH (ref 0.00–0.07)
Basophils Absolute: 0.1 10*3/uL (ref 0.0–0.1)
Basophils Relative: 0 %
Eosinophils Absolute: 0 10*3/uL (ref 0.0–0.5)
Eosinophils Relative: 0 %
HCT: 39.3 % (ref 39.0–52.0)
Hemoglobin: 13.6 g/dL (ref 13.0–17.0)
Immature Granulocytes: 1 %
Lymphocytes Relative: 6 %
Lymphs Abs: 1.3 10*3/uL (ref 0.7–4.0)
MCH: 30.3 pg (ref 26.0–34.0)
MCHC: 34.6 g/dL (ref 30.0–36.0)
MCV: 87.5 fL (ref 80.0–100.0)
Monocytes Absolute: 2.7 10*3/uL — ABNORMAL HIGH (ref 0.1–1.0)
Monocytes Relative: 12 %
Neutro Abs: 18.7 10*3/uL — ABNORMAL HIGH (ref 1.7–7.7)
Neutrophils Relative %: 81 %
Platelets: 354 10*3/uL (ref 150–400)
RBC: 4.49 MIL/uL (ref 4.22–5.81)
RDW: 16.2 % — ABNORMAL HIGH (ref 11.5–15.5)
WBC: 23.1 10*3/uL — ABNORMAL HIGH (ref 4.0–10.5)
nRBC: 0 % (ref 0.0–0.2)

## 2019-02-14 LAB — URINALYSIS, ROUTINE W REFLEX MICROSCOPIC
Bilirubin Urine: NEGATIVE
Glucose, UA: NEGATIVE mg/dL
Ketones, ur: 20 mg/dL — AB
Nitrite: NEGATIVE
Protein, ur: 100 mg/dL — AB
Specific Gravity, Urine: 1.013 (ref 1.005–1.030)
pH: 6 (ref 5.0–8.0)

## 2019-02-14 LAB — APTT: aPTT: 33 seconds (ref 24–36)

## 2019-02-14 LAB — LACTIC ACID, PLASMA: Lactic Acid, Venous: 1.2 mmol/L (ref 0.5–1.9)

## 2019-02-14 LAB — TROPONIN I (HIGH SENSITIVITY): Troponin I (High Sensitivity): 31 ng/L — ABNORMAL HIGH (ref <18)

## 2019-02-14 LAB — SARS CORONAVIRUS 2 BY RT PCR (HOSPITAL ORDER, PERFORMED IN ~~LOC~~ HOSPITAL LAB): SARS Coronavirus 2: NEGATIVE

## 2019-02-14 MED ORDER — FLUTICASONE FUROATE-VILANTEROL 100-25 MCG/INH IN AEPB
1.0000 | INHALATION_SPRAY | Freq: Every day | RESPIRATORY_TRACT | Status: DC
  Administered 2019-02-16 – 2019-02-18 (×3): 1 via RESPIRATORY_TRACT
  Filled 2019-02-14: qty 28

## 2019-02-14 MED ORDER — ENOXAPARIN SODIUM 40 MG/0.4ML ~~LOC~~ SOLN
40.0000 mg | SUBCUTANEOUS | Status: DC
  Administered 2019-02-15 – 2019-02-17 (×3): 40 mg via SUBCUTANEOUS
  Filled 2019-02-14 (×4): qty 0.4

## 2019-02-14 MED ORDER — ACETAMINOPHEN 650 MG RE SUPP: 650.0000 mg | Freq: Four times a day (QID) | RECTAL | Status: DC | PRN

## 2019-02-14 MED ORDER — SODIUM CHLORIDE 0.9 % IV BOLUS
1000.0000 mL | Freq: Once | INTRAVENOUS | Status: AC
  Administered 2019-02-14: 22:00:00 1000 mL via INTRAVENOUS

## 2019-02-14 MED ORDER — IPRATROPIUM-ALBUTEROL 0.5-2.5 (3) MG/3ML IN SOLN
3.0000 mL | Freq: Four times a day (QID) | RESPIRATORY_TRACT | Status: DC | PRN
  Administered 2019-02-15 (×2): 3 mL via RESPIRATORY_TRACT
  Filled 2019-02-14 (×2): qty 3

## 2019-02-14 MED ORDER — GABAPENTIN 300 MG PO CAPS
300.0000 mg | ORAL_CAPSULE | Freq: Two times a day (BID) | ORAL | Status: DC | PRN
  Administered 2019-02-15 – 2019-02-16 (×3): 300 mg via ORAL
  Filled 2019-02-14 (×3): qty 1

## 2019-02-14 MED ORDER — ACETAMINOPHEN 325 MG PO TABS
650.0000 mg | ORAL_TABLET | Freq: Once | ORAL | Status: AC
  Administered 2019-02-14: 650 mg via ORAL
  Filled 2019-02-14: qty 2

## 2019-02-14 MED ORDER — VANCOMYCIN HCL IN DEXTROSE 1-5 GM/200ML-% IV SOLN: 1000.0000 mg | INTRAVENOUS | Status: DC

## 2019-02-14 MED ORDER — SODIUM CHLORIDE 0.9 % IV SOLN
INTRAVENOUS | Status: AC
  Administered 2019-02-15 (×2): via INTRAVENOUS

## 2019-02-14 MED ORDER — ACETAMINOPHEN 325 MG PO TABS
650.0000 mg | ORAL_TABLET | Freq: Four times a day (QID) | ORAL | Status: DC | PRN
  Administered 2019-02-15 – 2019-02-16 (×5): 650 mg via ORAL
  Filled 2019-02-14 (×5): qty 2

## 2019-02-14 MED ORDER — VANCOMYCIN HCL IN DEXTROSE 1-5 GM/200ML-% IV SOLN
1000.0000 mg | Freq: Once | INTRAVENOUS | Status: AC
  Administered 2019-02-14: 1000 mg via INTRAVENOUS
  Filled 2019-02-14: qty 200

## 2019-02-14 MED ORDER — METRONIDAZOLE IN NACL 5-0.79 MG/ML-% IV SOLN
500.0000 mg | Freq: Once | INTRAVENOUS | Status: AC
  Administered 2019-02-14: 500 mg via INTRAVENOUS
  Filled 2019-02-14: qty 100

## 2019-02-14 MED ORDER — MORPHINE SULFATE (PF) 4 MG/ML IV SOLN
4.0000 mg | Freq: Once | INTRAVENOUS | Status: AC
  Administered 2019-02-14: 4 mg via INTRAVENOUS
  Filled 2019-02-14: qty 1

## 2019-02-14 MED ORDER — SODIUM CHLORIDE 0.9 % IV SOLN: 2.0000 g | Freq: Two times a day (BID) | INTRAVENOUS | Status: DC

## 2019-02-14 MED ORDER — IOHEXOL 350 MG/ML SOLN
80.0000 mL | Freq: Once | INTRAVENOUS | Status: AC | PRN
  Administered 2019-02-14: 22:00:00 80 mL via INTRAVENOUS

## 2019-02-14 MED ORDER — SODIUM CHLORIDE 0.9 % IV SOLN
2.0000 g | Freq: Once | INTRAVENOUS | Status: AC
  Administered 2019-02-14: 2 g via INTRAVENOUS
  Filled 2019-02-14: qty 2

## 2019-02-14 MED ORDER — HYDRALAZINE HCL 20 MG/ML IJ SOLN: 5.0000 mg | INTRAMUSCULAR | Status: DC | PRN

## 2019-02-14 MED ORDER — TAMSULOSIN HCL 0.4 MG PO CAPS
0.4000 mg | ORAL_CAPSULE | Freq: Every day | ORAL | Status: DC
  Administered 2019-02-15 – 2019-02-18 (×4): 0.4 mg via ORAL
  Filled 2019-02-14 (×4): qty 1

## 2019-02-14 MED ORDER — ATORVASTATIN CALCIUM 10 MG PO TABS
20.0000 mg | ORAL_TABLET | Freq: Every day | ORAL | Status: DC
  Administered 2019-02-15 – 2019-02-17 (×4): 20 mg via ORAL
  Filled 2019-02-14 (×4): qty 2

## 2019-02-14 MED ORDER — SODIUM CHLORIDE 0.9 % IV SOLN
1.0000 g | INTRAVENOUS | Status: DC
  Administered 2019-02-15 – 2019-02-17 (×3): 1 g via INTRAVENOUS
  Filled 2019-02-14: qty 1
  Filled 2019-02-14: qty 10
  Filled 2019-02-14 (×2): qty 1

## 2019-02-14 NOTE — ED Triage Notes (Signed)
Patient reports fever /chills , poor appetite and fatigue onset this week , recent bladder surgery 2 weeks ago .

## 2019-02-14 NOTE — H&P (Signed)
History and Physical    Connor Brandow Sr. TJQ:[id number] DOB: Jul 27, 1945 DOA: 02/14/2019  PCP: Dorothyann Peng, NP Patient coming from: Home  Chief Complaint: Fever, fatigue  HPI: Connor Pontiff Sr. is a 73 y.o. male with medical history significant of bladder cancer status post transurethral resection of bladder tumor and intravesical instillation of chemotherapy on 02/02/2019, COPD, hypertension, CKD stage [name], anxiety, chronic back pain presenting to the hospital for evaluation of fever and fatigue.  Patient reports 1 week history of fevers, fatigue, and decreased appetite.  Denies abdominal pain, nausea, vomiting, or diarrhea.  He has been having dysuria and not urinating much.  He is having right-sided flank pain.  Reports having a chronic cough and shortness of breath secondary to COPD.  States earlier today he had a "slight spasm" located in the middle of his chest at rest which lasted for very short time.  He was not having any nausea, diaphoresis, or dyspnea at that time.  Denies any chest pain at present.  ED Course: Temperature 100.1 F and tachycardic.  Not hypotensive.  SARS-CoV-2 test negative.  White count 23.1 with left shift.  Lactic acid normal.  Sodium 130.  Bicarb 16, anion gap 15.  BUN 14, creatinine 1.5.  Baseline creatinine 1.2-1.3.  AST 74, ALT 54, alk phos 146.  T bili normal.  LFTs were normal 2 months ago.  UA with moderate amount of leukocytes, 6-10 WBCs, and rare bacteria.  Urine culture pending.  Blood culture x2 pending.  High-sensitivity troponin 31.  EKG not suggestive of ACS.  Chest x-ray showing stable changes of COPD and no acute abnormality.  CT angiogram negative for PE.  CT showing right-sided pyelonephritis, no abscess.  Also showing nonobstructing bilateral renal calculi, no hydronephrosis. Patient received Tylenol, morphine, vancomycin, cefepime, metronidazole, and 1 L normal saline bolus in the ED.  Review of Systems:  All systems reviewed and apart from  history of presenting illness, are negative.  Past Medical History:  Diagnosis Date   Anxiety    pt. denies   Arthritis    and back problems   Cancer [facility])    Bladder Ca 2013, surgically removed   COPD (chronic obstructive pulmonary disease) ([place])    History of kidney stones    once  2013 had surgery   Hypertension    Shortness of breath    with exertion    Past Surgical History:  Procedure Laterality Date   BACK SURGERY     lower   cancer removed     x 2   EYE SURGERY     bil cataracts   TRANSURETHRAL RESECTION OF BLADDER TUMOR N/A 11/16/2017   Procedure: TRANSURETHRAL RESECTION OF BLADDER TUMOR (TURBT);  Surgeon: Lucas Mallow, MD;  Location: [facility];  Service: Urology;  Laterality: N/A;   TRANSURETHRAL RESECTION OF BLADDER TUMOR WITH MITOMYCIN-C N/A 02/02/2019   Procedure: TRANSURETHRAL RESECTION OF BLADDER TUMOR WITH GEMCITABINE;  Surgeon: Lucas Mallow, MD;  Location: [facility];  Service: Urology;  Laterality: N/A;     reports that he quit smoking about 5 years ago. His smoking use included cigarettes. He has a 50.00 pack-year smoking history. He has never used smokeless tobacco. He reports that he does not drink alcohol or use drugs.  No Known Allergies  Family History  Problem Relation Age of Onset   Cancer Brother    Cancer Father    Cancer Sister     Prior to Admission medications  Medication Sig Start Date End Date Taking? Authorizing Provider  albuterol (PROVENTIL HFA;VENTOLIN HFA) 108 (90 Base) MCG/ACT inhaler Inhale 2 puffs into the lungs every 6 (six) hours as needed for wheezing or shortness of breath. Patient not taking: Reported on 01/21/2019 03/24/17   Katy Apo, NP  albuterol (PROVENTIL) (2.5 MG/3ML) 0.083% nebulizer solution Take 3 mLs (2.5 mg total) by nebulization every 6 (six) hours as needed for wheezing or shortness of breath. Patient not taking: Reported on 01/21/2019 10/15/17   Rigoberto Noel, MD  amLODipine (NORVASC)  10 MG tablet TAKE 1 TABLET(10 MG) BY MOUTH DAILY Patient taking differently: Take 10 mg by mouth daily.  01/13/19   Nafziger, Tommi Rumps, NP  atorvastatin (LIPITOR) 20 MG tablet TAKE 1 TABLET BY MOUTH BEFORE BEDTIME TO REDUCE CHOLESTEROL Patient taking differently: Take 20 mg by mouth at bedtime.  10/18/18   Nafziger, Tommi Rumps, NP  BREO ELLIPTA 100-25 MCG/INH AEPB INHALE 1 PUFF INTO THE LUNGS DAILY Patient taking differently: Inhale 1 puff into the lungs daily.  09/01/18   Rigoberto Noel, MD  citalopram (CELEXA) 20 MG tablet TAKE 1 TABLET(20 MG) BY MOUTH DAILY Patient not taking: Reported on 01/21/2019 12/08/18   Nafziger, Tommi Rumps, NP  fluticasone furoate-vilanterol (BREO ELLIPTA) 100-25 MCG/INH AEPB Inhale 1 puff into the lungs daily. Patient not taking: Reported on 01/21/2019 10/15/17   Rigoberto Noel, MD  gabapentin (NEURONTIN) 300 MG capsule TAKE 1 CAPSULE(300 MG) BY MOUTH TWICE DAILY Patient taking differently: Take 300 mg by mouth 2 (two) times daily as needed (pain).  12/08/18   Nafziger, Tommi Rumps, NP  ipratropium-albuterol (DUONEB) 0.5-2.5 (3) MG/3ML SOLN Take 3 mLs by nebulization every 6 (six) hours as needed. For shortness of breath 04/07/17   Rigoberto Noel, MD  ketoconazole (NIZORAL) 2 % cream Apply 1 application topically daily. Patient not taking: Reported on 01/21/2019 09/09/17   Dorothyann Peng, NP  losartan (COZAAR) 100 MG tablet TAKE 1 TABLET BY MOUTH DAILY Patient taking differently: Take 100 mg by mouth daily.  01/13/19   Nafziger, Tommi Rumps, NP  predniSONE (DELTASONE) 20 MG tablet Take 1 tablet (20 mg total) by mouth daily with breakfast. Patient not taking: Reported on 01/21/2019 12/08/18   Dorothyann Peng, NP  tamsulosin (FLOMAX) 0.4 MG CAPS capsule Take 0.4 mg by mouth daily.  10/06/17   Kathie Rhodes, MD    Physical Exam: Vitals:   02/14/19 1949 02/14/19 2101 02/14/19 2314  BP: (!) 148/70 (!) 177/78 (!) 146/66  Pulse: (!) 121 (!) 107 89  Resp: 20 (!) 22 20  Temp: 99.7 F (37.6 C) 100.1 F (37.8 C)    TempSrc: Oral Oral Oral  SpO2: 95% 97% 94%    Physical Exam  Constitutional: He is oriented to person, place, and time. He appears well-developed and well-nourished. No distress.  HENT:  Head: Normocephalic.  Dry mucous membranes  Eyes: Right eye exhibits no discharge. Left eye exhibits no discharge.  Neck: Neck supple.  Cardiovascular: Normal rate, regular rhythm and intact distal pulses.  Pulmonary/Chest: Effort normal and breath sounds normal. No respiratory distress. He has no wheezes. He has no rales.  Abdominal: Soft. Bowel sounds are normal. He exhibits no distension. There is no abdominal tenderness. There is no guarding.  Musculoskeletal:        General: No edema.  Neurological: He is alert and oriented to person, place, and time.  Skin: Skin is warm and dry. He is not diaphoretic.     Labs on Admission: I have  personally reviewed following labs and imaging studies  CBC: Recent Labs  Lab 02/14/19 1951  WBC 23.1*  NEUTROABS 18.7*  HGB 13.6  HCT 39.3  MCV 87.5  PLT 354   Basic Metabolic Panel: Recent Labs  Lab 02/14/19 1951  NA 130*  K 3.6  CL 99  CO2 16*  GLUCOSE 119*  BUN 14  CREATININE 1.50*  CALCIUM 8.6*   GFR: Estimated Creatinine Clearance: 40.4 mL/min (A) (by C-G formula based on SCr of 1.5 mg/dL (H)). Liver Function Tests: Recent Labs  Lab 02/14/19 1951  AST 74*  ALT 54*  ALKPHOS 146*  BILITOT 1.2  PROT 6.7  ALBUMIN 3.2*   No results for input(s): LIPASE, AMYLASE in the last 168 hours. No results for input(s): AMMONIA in the last 168 hours. Coagulation Profile: Recent Labs  Lab 02/14/19 2106  INR 1.2   Cardiac Enzymes: No results for input(s): CKTOTAL, CKMB, CKMBINDEX, TROPONINI in the last 168 hours. BNP (last 3 results) No results for input(s): PROBNP in the last 8760 hours. HbA1C: No results for input(s): HGBA1C in the last 72 hours. CBG: No results for input(s): GLUCAP in the last 168 hours. Lipid Profile: No results  for input(s): CHOL, HDL, LDLCALC, TRIG, CHOLHDL, LDLDIRECT in the last 72 hours. Thyroid Function Tests: No results for input(s): TSH, T4TOTAL, FREET4, T3FREE, THYROIDAB in the last 72 hours. Anemia Panel: No results for input(s): VITAMINB12, FOLATE, FERRITIN, TIBC, IRON, RETICCTPCT in the last 72 hours. Urine analysis:    Component Value Date/Time   COLORURINE YELLOW 02/14/2019 2149   APPEARANCEUR CLEAR 02/14/2019 2149   LABSPEC 1.013 02/14/2019 2149   PHURINE 6.0 02/14/2019 2149   GLUCOSEU NEGATIVE 02/14/2019 2149   HGBUR SMALL (A) 02/14/2019 2149   BILIRUBINUR NEGATIVE 02/14/2019 2149   KETONESUR 20 (A) 02/14/2019 2149   PROTEINUR 100 (A) 02/14/2019 2149   UROBILINOGEN 0.2 11/07/2013 0211   NITRITE NEGATIVE 02/14/2019 2149   LEUKOCYTESUR MODERATE (A) 02/14/2019 2149    Radiological Exams on Admission: Ct Angio Chest Pe W/cm &/or Wo Cm  Result Date: 02/14/2019 CLINICAL DATA:  73 year old male with fever, chills, loss of appetite. EXAM: CT ANGIOGRAPHY CHEST CT ABDOMEN AND PELVIS WITH CONTRAST TECHNIQUE: Multidetector CT imaging of the chest was performed using the standard protocol during bolus administration of intravenous contrast. Multiplanar CT image reconstructions and MIPs were obtained to evaluate the vascular anatomy. Multidetector CT imaging of the abdomen and pelvis was performed using the standard protocol during bolus administration of intravenous contrast. CONTRAST:  49m OMNIPAQUE IOHEXOL 350 MG/ML SOLN COMPARISON:  Chest radiograph dated 02/14/2019. FINDINGS: CTA CHEST FINDINGS Cardiovascular: There is no cardiomegaly or pericardial effusion. Coronary vascular calcification of the LAD. Moderate calcified and noncalcified plaque of the aortic wall. No aneurysmal dilatation or dissection. The origins of the great vessels of the aortic arch appear patent as visualized. No pulmonary artery embolus identified. Mediastinum/Nodes: There is no hilar or mediastinal adenopathy. The  esophagus and the thyroid gland are grossly unremarkable. No mediastinal fluid collection. Lungs/Pleura: There is mild centrilobular emphysema. Bibasilar subpleural linear atelectasis/scarring. There is no focal consolidation, pleural effusion, or pneumothorax. The central airways are patent. Musculoskeletal: Degenerative changes of the spine. No acute osseous pathology. Review of the MIP images confirms the above findings. CT ABDOMEN and PELVIS FINDINGS No intra-abdominal free air or free fluid. Hepatobiliary: The liver is unremarkable. No intrahepatic biliary ductal dilatation. No calcified gallstone or pericholecystic fluid. Pancreas: Unremarkable. No pancreatic ductal dilatation or surrounding inflammatory changes. Spleen: Splenectomy. Adrenals/Urinary  Tract: There is a 5 mm nonobstructing right renal inferior pole calculus. There is a 3 mm nonobstructing left renal inferior pole calculus. There is no hydronephrosis on either side. Multiple bilateral renal hypodense lesions measure up to 15 mm in the upper pole of the right kidney. Larger lesions demonstrate fluid attenuation most consistent with cysts and the smaller lesions are too small to characterize. There is heterogeneous enhancement of the right renal parenchyma with areas of decreased nephrogram consistent with right-sided pyelonephritis. No abscess. Correlation with urinalysis recommended. Mild left pyelonephritis may also be present. The visualized ureters appear unremarkable. The urinary bladder is grossly unremarkable. Stomach/Bowel: There is no bowel obstruction or active inflammation. The appendix is normal. Vascular/Lymphatic: Moderate aortoiliac atherosclerotic disease. The IVC is unremarkable. No portal venous gas. There is no adenopathy. Reproductive: Mildly enlarged prostate gland measuring 5.5 cm in transverse axial diameter. The seminal vesicles are symmetric. Other: None Musculoskeletal: Degenerative changes of the spine. No acute osseous  pathology. Review of the MIP images confirms the above findings. IMPRESSION: 1. No acute intrathoracic pathology. No CT evidence of pulmonary artery embolus. 2. Right-sided pyelonephritis. Correlation with urinalysis recommended. No abscess. 3. Nonobstructing bilateral renal calculi. No hydronephrosis. Aortic Atherosclerosis (ICD10-I70.0) and Emphysema (ICD10-J43.9). Electronically Signed   By: Anner Crete M.D.   On: 02/14/2019 22:32   Ct Abdomen Pelvis W Contrast  Result Date: 02/14/2019 CLINICAL DATA:  73 year old male with fever, chills, loss of appetite. EXAM: CT ANGIOGRAPHY CHEST CT ABDOMEN AND PELVIS WITH CONTRAST TECHNIQUE: Multidetector CT imaging of the chest was performed using the standard protocol during bolus administration of intravenous contrast. Multiplanar CT image reconstructions and MIPs were obtained to evaluate the vascular anatomy. Multidetector CT imaging of the abdomen and pelvis was performed using the standard protocol during bolus administration of intravenous contrast. CONTRAST:  30m OMNIPAQUE IOHEXOL 350 MG/ML SOLN COMPARISON:  Chest radiograph dated 02/14/2019. FINDINGS: CTA CHEST FINDINGS Cardiovascular: There is no cardiomegaly or pericardial effusion. Coronary vascular calcification of the LAD. Moderate calcified and noncalcified plaque of the aortic wall. No aneurysmal dilatation or dissection. The origins of the great vessels of the aortic arch appear patent as visualized. No pulmonary artery embolus identified. Mediastinum/Nodes: There is no hilar or mediastinal adenopathy. The esophagus and the thyroid gland are grossly unremarkable. No mediastinal fluid collection. Lungs/Pleura: There is mild centrilobular emphysema. Bibasilar subpleural linear atelectasis/scarring. There is no focal consolidation, pleural effusion, or pneumothorax. The central airways are patent. Musculoskeletal: Degenerative changes of the spine. No acute osseous pathology. Review of the MIP images  confirms the above findings. CT ABDOMEN and PELVIS FINDINGS No intra-abdominal free air or free fluid. Hepatobiliary: The liver is unremarkable. No intrahepatic biliary ductal dilatation. No calcified gallstone or pericholecystic fluid. Pancreas: Unremarkable. No pancreatic ductal dilatation or surrounding inflammatory changes. Spleen: Splenectomy. Adrenals/Urinary Tract: There is a 5 mm nonobstructing right renal inferior pole calculus. There is a 3 mm nonobstructing left renal inferior pole calculus. There is no hydronephrosis on either side. Multiple bilateral renal hypodense lesions measure up to 15 mm in the upper pole of the right kidney. Larger lesions demonstrate fluid attenuation most consistent with cysts and the smaller lesions are too small to characterize. There is heterogeneous enhancement of the right renal parenchyma with areas of decreased nephrogram consistent with right-sided pyelonephritis. No abscess. Correlation with urinalysis recommended. Mild left pyelonephritis may also be present. The visualized ureters appear unremarkable. The urinary bladder is grossly unremarkable. Stomach/Bowel: There is no bowel obstruction or active inflammation. The appendix  is normal. Vascular/Lymphatic: Moderate aortoiliac atherosclerotic disease. The IVC is unremarkable. No portal venous gas. There is no adenopathy. Reproductive: Mildly enlarged prostate gland measuring 5.5 cm in transverse axial diameter. The seminal vesicles are symmetric. Other: None Musculoskeletal: Degenerative changes of the spine. No acute osseous pathology. Review of the MIP images confirms the above findings. IMPRESSION: 1. No acute intrathoracic pathology. No CT evidence of pulmonary artery embolus. 2. Right-sided pyelonephritis. Correlation with urinalysis recommended. No abscess. 3. Nonobstructing bilateral renal calculi. No hydronephrosis. Aortic Atherosclerosis (ICD10-I70.0) and Emphysema (ICD10-J43.9). Electronically Signed   By:  Anner Crete M.D.   On: 02/14/2019 22:32   Dg Chest Port 1 View  Result Date: 02/14/2019 CLINICAL DATA:  Fever and chills. Bladder surgery 2 weeks ago. History of COPD and hypertension. EXAM: PORTABLE CHEST 1 VIEW COMPARISON:  04/07/2017 FINDINGS: Normal sized heart. Tortuous aorta. Clear lungs. The lungs remain hyperexpanded. Stable mild elevation of the left hemidiaphragm. Unremarkable bones. IMPRESSION: No acute abnormality.  Stable changes of COPD. Electronically Signed   By: Claudie Revering M.D.   On: 02/14/2019 21:47    EKG: Independently reviewed.  Sinus tachycardia.  Slight baseline wander.  Assessment/Plan Principal Problem:   Pyelonephritis Active Problems:   Sepsis ([place])   AKI (acute kidney injury) ([place])   Hyponatremia   Metabolic acidosis   Sepsis secondary to pyelonephritis Febrile and tachycardic.  Not hypotensive.  White count 23.1 with left shift.  Lactic acid normal. UA with moderate amount of leukocytes, 6-10 WBCs, and rare bacteria.  CT showing right-sided pyelonephritis, no abscess. -IV fluid -Received broad-spectrum antibiotics including vancomycin, cefepime, and metronidazole in the ED when the source of infection was unknown.  Narrow antibiotic coverage to ceftriaxone starting tomorrow. -Tylenol PRN -Urine culture pending -Blood culture x2 pending  AKI on CKD stage [name] Likely prerenal due to dehydration and home losartan use.  BUN 14, creatinine 1.5.  Baseline creatinine 1.2-1.3. -IV fluid hydration -Continue to monitor renal function -Monitor urine output -Avoid nephrotoxic agents  Mild hyponatremia Sodium 130. -IV fluid hydration -Continue to monitor BMP  Normal anion gap metabolic acidosis Likely related to AKI.  Bicarb 16, anion gap 15.  Patient denies diarrhea.  He is not on a diuretic at home.  Lactic acid normal. -IV fluid hydration -Repeat BMP now and in a.m.  Elevated LFTs Possibly related to sepsis.  AST 74, ALT 54, alk phos 146.  T bili  normal.  LFTs were normal 2 months ago.  Patient denies abdominal pain, nausea, or vomiting.  Abdominal exam benign.  -Right upper quadrant ultrasound -Continue to monitor LFTs  Chest discomfort Patient reports having a brief episode of substernal chest discomfort at rest which he describes as a spasm earlier today.  No associated dyspnea, diaphoresis, or nausea.  Denies chest pain at present.  Appears comfortable on exam.  No documented history of coronary artery disease.  High-sensitivity troponin mildly elevated at 31, likely secondary to demand ischemia from sepsis.  EKG not suggestive of ACS.  CT angiogram negative for PE. -Cardiac monitoring -Trend troponin  COPD -Stable.  Continue home inhalers.  Hypertension -Systolic currently in 774J.  Hold antihypertensives in the setting of sepsis.  Hydralazine PRN SBP greater than 180.  DVT prophylaxis: Lovenox Code Status: Patient wishes to be DNR. Family Communication: Daughter at bedside. Disposition Plan: Anticipate discharge after clinical improvement. Consults called: None Admission status: It is my clinical opinion that admission to INPATIENT is reasonable and necessary in this 73 y.o. male  presenting with  sepsis secondary to pyelonephritis.  Febrile and tachycardic.  Receiving IV fluid and antibiotic.  Urine culture and blood cultures pending.  Given the aforementioned, the predictability of an adverse outcome is felt to be significant. I expect that the patient will require at least 2 midnights in the hospital to treat this condition.   The medical decision making on this patient was of high complexity and the patient is at high risk for clinical deterioration, therefore this is a level 3 visit.  Shela Leff MD [facility] Pager [phone]  If 7PM-7AM, please contact night-coverage [email or web address] Password [email or web address]  02/15/2019, 12:06 AM

## 2019-02-14 NOTE — Progress Notes (Signed)
Pharmacy Antibiotic Note  Connor Mather Kladis Sr. is a 73 y.o. male admitted on 02/14/2019 with sepsis.  Pharmacy has been consulted for Vancomycin and Cefepime dosing.  Vancomycin 1000 mg IV Q 24 hrs. Goal AUC 400-550. Expected AUC: 495 SCr used: 1.5  Plan: Vancomycin 1000 mg IV q24h Cefepime 2 gms IV q12hr  Monitor renal function, clinical status, C&S and vanc levels as needed  Temp (24hrs), Avg:99.9 F (37.7 C), Min:99.7 F (37.6 C), Max:100.1 F (37.8 C)  Recent Labs  Lab 02/14/19 1951  WBC 23.1*  CREATININE 1.50*  LATICACIDVEN 1.2    Estimated Creatinine Clearance: 40.4 mL/min (A) (by C-G formula based on SCr of 1.5 mg/dL (H)).    No Known Allergies  Antimicrobials this admission: Vanc 9/7 >> Cefepime 9/7 >>  Thank you for allowing pharmacy to be a part of this patient's care.  Alanda Slim, PharmD, [facility] Clinical Pharmacist Please see AMION for all Pharmacists' Contact Phone Numbers 02/14/2019, 9:15 PM

## 2019-02-14 NOTE — ED Provider Notes (Addendum)
[facility] EMERGENCY DEPARTMENT Provider Note   CSN: [id number] Arrival date & time: 02/14/19  1939    History   Chief Complaint Chief Complaint  Patient presents with   Fever   HPI Connor Viliami Bracco Sr. is a 73 y.o. male with history significant for bladder cancer, surgically removed 2 weeks ago, COPD, hypertension, anxiety, chronic back pain who presents for evaluation of fatigue, fever.  Patient states he has felt warm at home.  Temp max 100.9.  Has had overall decreased appetite.  Was seen by Dr. Gloriann Loan who performed his bladder surgery as he was unable to void.  Had Foley catheter placed which was subsequently been removed.  Patient states he is able to empty his bladder however small amounts.  He admits to intermittent chest pain as well as shortness of breath.  Patient states he became short of breath after he wear the facemask required for COVID.  No known COVID exposures.  Denies headache, dizziness, lightheadedness, hemoptysis, abdominal pain, diarrhea, dysuria, new lateral weakness.  Denies additional aggravating or alleviating factors.  Denies prior history of MI.   History obtained from patient and past medical records.  No interpreter was used.     HPI  Past Medical History:  Diagnosis Date   Anxiety    pt. denies   Arthritis    and back problems   Cancer ([place])    Bladder Ca 2013, surgically removed   COPD (chronic obstructive pulmonary disease) ([place])    History of kidney stones    once  2013 had surgery   Hypertension    Shortness of breath    with exertion    Patient Active Problem List   Diagnosis Date Noted   Pyelonephritis 02/14/2019   Metatarsalgia of both feet 11/15/2017   Asthma 10/15/2017   Anxiety 10/01/2017   Rhinitis 08/14/2017   BLADDER CANCER 10/02/2007   Hyperlipidemia 10/02/2007   ALCOHOL ABUSE 10/02/2007   EROSIVE ESOPHAGITIS 10/02/2007   ESOPHAGEAL STENOSIS 10/02/2007   HIATAL HERNIA 10/02/2007    FATIGUE 10/02/2007   Essential hypertension 12/26/2006   GERD 12/26/2006   HEADACHE 12/26/2006    Past Surgical History:  Procedure Laterality Date   BACK SURGERY     lower   cancer removed     x 2   EYE SURGERY     bil cataracts   TRANSURETHRAL RESECTION OF BLADDER TUMOR N/A 11/16/2017   Procedure: TRANSURETHRAL RESECTION OF BLADDER TUMOR (TURBT);  Surgeon: Lucas Mallow, MD;  Location: [facility];  Service: Urology;  Laterality: N/A;   TRANSURETHRAL RESECTION OF BLADDER TUMOR WITH MITOMYCIN-C N/A 02/02/2019   Procedure: TRANSURETHRAL RESECTION OF BLADDER TUMOR WITH GEMCITABINE;  Surgeon: Lucas Mallow, MD;  Location: [facility];  Service: Urology;  Laterality: N/A;        Home Medications    Prior to Admission medications   Medication Sig Start Date End Date Taking? Authorizing Provider  amLODipine (NORVASC) 10 MG tablet TAKE 1 TABLET(10 MG) BY MOUTH DAILY Patient taking differently: Take 10 mg by mouth daily.  01/13/19  Yes Nafziger, Tommi Rumps, NP  atorvastatin (LIPITOR) 20 MG tablet TAKE 1 TABLET BY MOUTH BEFORE BEDTIME TO REDUCE CHOLESTEROL Patient taking differently: Take 20 mg by mouth at bedtime.  10/18/18  Yes Nafziger, Tommi Rumps, NP  BREO ELLIPTA 100-25 MCG/INH AEPB INHALE 1 PUFF INTO THE LUNGS DAILY Patient taking differently: Inhale 1 puff into the lungs daily.  09/01/18  Yes Rigoberto Noel, MD  gabapentin (  NEURONTIN) 300 MG capsule TAKE 1 CAPSULE(300 MG) BY MOUTH TWICE DAILY Patient taking differently: Take 300 mg by mouth 2 (two) times daily as needed (pain).  12/08/18  Yes Nafziger, Tommi Rumps, NP  ipratropium-albuterol (DUONEB) 0.5-2.5 (3) MG/3ML SOLN Take 3 mLs by nebulization every 6 (six) hours as needed. For shortness of breath 04/07/17  Yes Rigoberto Noel, MD  losartan (COZAAR) 100 MG tablet TAKE 1 TABLET BY MOUTH DAILY Patient taking differently: Take 100 mg by mouth daily.  01/13/19  Yes Nafziger, Tommi Rumps, NP  tamsulosin (FLOMAX) 0.4 MG CAPS capsule Take 0.4 mg by mouth  daily.  10/06/17  Yes Kathie Rhodes, MD  albuterol (PROVENTIL HFA;VENTOLIN HFA) 108 (90 Base) MCG/ACT inhaler Inhale 2 puffs into the lungs every 6 (six) hours as needed for wheezing or shortness of breath. Patient not taking: Reported on 02/14/2019 03/24/17   Katy Apo, NP  albuterol (PROVENTIL) (2.5 MG/3ML) 0.083% nebulizer solution Take 3 mLs (2.5 mg total) by nebulization every 6 (six) hours as needed for wheezing or shortness of breath. Patient not taking: Reported on 02/14/2019 10/15/17   Rigoberto Noel, MD  citalopram (CELEXA) 20 MG tablet TAKE 1 TABLET(20 MG) BY MOUTH DAILY Patient not taking: Reported on 01/21/2019 12/08/18   Dorothyann Peng, NP  fluticasone furoate-vilanterol (BREO ELLIPTA) 100-25 MCG/INH AEPB Inhale 1 puff into the lungs daily. Patient not taking: Reported on 01/21/2019 10/15/17   Rigoberto Noel, MD  ketoconazole (NIZORAL) 2 % cream Apply 1 application topically daily. Patient not taking: Reported on 01/21/2019 09/09/17   Dorothyann Peng, NP  predniSONE (DELTASONE) 20 MG tablet Take 1 tablet (20 mg total) by mouth daily with breakfast. Patient not taking: Reported on 01/21/2019 12/08/18   Dorothyann Peng, NP    Family History Family History  Problem Relation Age of Onset   Cancer Brother    Cancer Father    Cancer Sister     Social History Social History   Tobacco Use   Smoking status: Former Smoker    Packs/day: 1.00    Years: 50.00    Pack years: 50.00    Types: Cigarettes    Quit date: 09/21/2013    Years since quitting: 5.4   Smokeless tobacco: Never Used  Substance Use Topics   Alcohol use: No   Drug use: No     Allergies   Patient has no known allergies.   Review of Systems Review of Systems  Constitutional: Positive for appetite change, chills and fever.  HENT: Negative.   Respiratory: Positive for shortness of breath (intermittent).   Cardiovascular: Positive for chest pain (intermittent). Negative for palpitations and leg swelling.   Gastrointestinal: Positive for constipation, nausea and vomiting. Negative for abdominal distention, abdominal pain, anal bleeding, blood in stool and rectal pain.  Genitourinary: Positive for decreased urine volume and difficulty urinating. Negative for dysuria, enuresis, flank pain and urgency.  Musculoskeletal: Negative.  Negative for back pain (Chronic) and gait problem.  Skin: Negative.   Neurological: Negative.   All other systems reviewed and are negative.    Physical Exam Updated Vital Signs BP (!) 146/66 (BP Location: Right Arm)    Pulse 89    Temp 100.1 F (37.8 C) (Oral)    Resp 20    SpO2 94%   Physical Exam Vitals signs and nursing note reviewed.  Constitutional:      General: He is not in acute distress.    Appearance: He is well-developed. He is ill-appearing. He is not toxic-appearing or  diaphoretic.  HENT:     Head: Normocephalic and atraumatic.     Nose: Nose normal.     Mouth/Throat:     Mouth: Mucous membranes are dry.  Eyes:     Pupils: Pupils are equal, round, and reactive to light.  Neck:     Musculoskeletal: Normal range of motion and neck supple.  Cardiovascular:     Rate and Rhythm: Normal rate and regular rhythm.     Pulses: Normal pulses.     Heart sounds: Normal heart sounds.  Pulmonary:     Effort: Pulmonary effort is normal. No respiratory distress.     Breath sounds: Normal breath sounds.     Comments: Clear to auscultation bilaterally without wheeze, rhonchi or rales. Abdominal:     General: There is no distension.     Palpations: Abdomen is soft.     Comments: Soft, mild generalized tenderness to palpation.  No rebound or guarding.  Negative CVA tap bilaterally.  Musculoskeletal: Normal range of motion.     Comments: Moves all 4 extremities without difficulty.  Calves nontender without edema, erythema or warmth bilaterally.  Compartment soft.  No evidence of DVT.  Skin:    General: Skin is warm and dry.     Comments: No rashes or  lesions.  Brisk capillary refill.  Neurological:     Mental Status: He is alert.     Comments: Cranial nerves II through XII grossly intact.  Ambulatory without difficulty.  No weakness.  No facial droop.    ED Treatments / Results  Labs (all labs ordered are listed, but only abnormal results are displayed) Labs Reviewed  CBC WITH DIFFERENTIAL/PLATELET - Abnormal; Notable for the following components:      Result Value   WBC 23.1 (*)    RDW 16.2 (*)    Neutro Abs 18.7 (*)    Monocytes Absolute 2.7 (*)    Abs Immature Granulocytes 0.33 (*)    All other components within normal limits  COMPREHENSIVE METABOLIC PANEL - Abnormal; Notable for the following components:   Sodium 130 (*)    CO2 16 (*)    Glucose, Bld 119 (*)    Creatinine, Ser 1.50 (*)    Calcium 8.6 (*)    Albumin 3.2 (*)    AST 74 (*)    ALT 54 (*)    Alkaline Phosphatase 146 (*)    GFR calc non Af Amer 45 (*)    GFR calc Af Amer 52 (*)    All other components within normal limits  URINALYSIS, ROUTINE W REFLEX MICROSCOPIC - Abnormal; Notable for the following components:   Hgb urine dipstick SMALL (*)    Ketones, ur 20 (*)    Protein, ur 100 (*)    Leukocytes,Ua MODERATE (*)    Bacteria, UA RARE (*)    All other components within normal limits  TROPONIN I (HIGH SENSITIVITY) - Abnormal; Notable for the following components:   Troponin I (High Sensitivity) 31 (*)    All other components within normal limits  SARS CORONAVIRUS 2 (HOSPITAL ORDER, [facility])  CULTURE, BLOOD (ROUTINE X 2)  CULTURE, BLOOD (ROUTINE X 2)  URINE CULTURE  LACTIC ACID, PLASMA  APTT  PROTIME-INR  CBC  COMPREHENSIVE METABOLIC PANEL  TROPONIN I (HIGH SENSITIVITY)    EKG EKG Interpretation  Date/Time:  Monday February 14 2019 21:02:36 EDT Ventricular Rate:  108 PR Interval:    QRS Duration: 73 QT Interval:  322 QTC  Calculation: 432 R Axis:   37 Text Interpretation:  Sinus tachycardia Abnormal R-wave  progression, early transition Confirmed by Veryl Speak [phone]) on 02/14/2019 11:17:08 PM   Radiology Ct Angio Chest Pe W/cm &/or Wo Cm  Result Date: 02/14/2019 CLINICAL DATA:  73 year old male with fever, chills, loss of appetite. EXAM: CT ANGIOGRAPHY CHEST CT ABDOMEN AND PELVIS WITH CONTRAST TECHNIQUE: Multidetector CT imaging of the chest was performed using the standard protocol during bolus administration of intravenous contrast. Multiplanar CT image reconstructions and MIPs were obtained to evaluate the vascular anatomy. Multidetector CT imaging of the abdomen and pelvis was performed using the standard protocol during bolus administration of intravenous contrast. CONTRAST:  17m OMNIPAQUE IOHEXOL 350 MG/ML SOLN COMPARISON:  Chest radiograph dated 02/14/2019. FINDINGS: CTA CHEST FINDINGS Cardiovascular: There is no cardiomegaly or pericardial effusion. Coronary vascular calcification of the LAD. Moderate calcified and noncalcified plaque of the aortic wall. No aneurysmal dilatation or dissection. The origins of the great vessels of the aortic arch appear patent as visualized. No pulmonary artery embolus identified. Mediastinum/Nodes: There is no hilar or mediastinal adenopathy. The esophagus and the thyroid gland are grossly unremarkable. No mediastinal fluid collection. Lungs/Pleura: There is mild centrilobular emphysema. Bibasilar subpleural linear atelectasis/scarring. There is no focal consolidation, pleural effusion, or pneumothorax. The central airways are patent. Musculoskeletal: Degenerative changes of the spine. No acute osseous pathology. Review of the MIP images confirms the above findings. CT ABDOMEN and PELVIS FINDINGS No intra-abdominal free air or free fluid. Hepatobiliary: The liver is unremarkable. No intrahepatic biliary ductal dilatation. No calcified gallstone or pericholecystic fluid. Pancreas: Unremarkable. No pancreatic ductal dilatation or surrounding inflammatory changes. Spleen:  Splenectomy. Adrenals/Urinary Tract: There is a 5 mm nonobstructing right renal inferior pole calculus. There is a 3 mm nonobstructing left renal inferior pole calculus. There is no hydronephrosis on either side. Multiple bilateral renal hypodense lesions measure up to 15 mm in the upper pole of the right kidney. Larger lesions demonstrate fluid attenuation most consistent with cysts and the smaller lesions are too small to characterize. There is heterogeneous enhancement of the right renal parenchyma with areas of decreased nephrogram consistent with right-sided pyelonephritis. No abscess. Correlation with urinalysis recommended. Mild left pyelonephritis may also be present. The visualized ureters appear unremarkable. The urinary bladder is grossly unremarkable. Stomach/Bowel: There is no bowel obstruction or active inflammation. The appendix is normal. Vascular/Lymphatic: Moderate aortoiliac atherosclerotic disease. The IVC is unremarkable. No portal venous gas. There is no adenopathy. Reproductive: Mildly enlarged prostate gland measuring 5.5 cm in transverse axial diameter. The seminal vesicles are symmetric. Other: None Musculoskeletal: Degenerative changes of the spine. No acute osseous pathology. Review of the MIP images confirms the above findings. IMPRESSION: 1. No acute intrathoracic pathology. No CT evidence of pulmonary artery embolus. 2. Right-sided pyelonephritis. Correlation with urinalysis recommended. No abscess. 3. Nonobstructing bilateral renal calculi. No hydronephrosis. Aortic Atherosclerosis (ICD10-I70.0) and Emphysema (ICD10-J43.9). Electronically Signed   By: AAnner CreteM.D.   On: 02/14/2019 22:32   Ct Abdomen Pelvis W Contrast  Result Date: 02/14/2019 CLINICAL DATA:  73year old male with fever, chills, loss of appetite. EXAM: CT ANGIOGRAPHY CHEST CT ABDOMEN AND PELVIS WITH CONTRAST TECHNIQUE: Multidetector CT imaging of the chest was performed using the standard protocol during  bolus administration of intravenous contrast. Multiplanar CT image reconstructions and MIPs were obtained to evaluate the vascular anatomy. Multidetector CT imaging of the abdomen and pelvis was performed using the standard protocol during bolus administration of intravenous contrast. CONTRAST:  867mOMNIPAQUE  IOHEXOL 350 MG/ML SOLN COMPARISON:  Chest radiograph dated 02/14/2019. FINDINGS: CTA CHEST FINDINGS Cardiovascular: There is no cardiomegaly or pericardial effusion. Coronary vascular calcification of the LAD. Moderate calcified and noncalcified plaque of the aortic wall. No aneurysmal dilatation or dissection. The origins of the great vessels of the aortic arch appear patent as visualized. No pulmonary artery embolus identified. Mediastinum/Nodes: There is no hilar or mediastinal adenopathy. The esophagus and the thyroid gland are grossly unremarkable. No mediastinal fluid collection. Lungs/Pleura: There is mild centrilobular emphysema. Bibasilar subpleural linear atelectasis/scarring. There is no focal consolidation, pleural effusion, or pneumothorax. The central airways are patent. Musculoskeletal: Degenerative changes of the spine. No acute osseous pathology. Review of the MIP images confirms the above findings. CT ABDOMEN and PELVIS FINDINGS No intra-abdominal free air or free fluid. Hepatobiliary: The liver is unremarkable. No intrahepatic biliary ductal dilatation. No calcified gallstone or pericholecystic fluid. Pancreas: Unremarkable. No pancreatic ductal dilatation or surrounding inflammatory changes. Spleen: Splenectomy. Adrenals/Urinary Tract: There is a 5 mm nonobstructing right renal inferior pole calculus. There is a 3 mm nonobstructing left renal inferior pole calculus. There is no hydronephrosis on either side. Multiple bilateral renal hypodense lesions measure up to 15 mm in the upper pole of the right kidney. Larger lesions demonstrate fluid attenuation most consistent with cysts and the  smaller lesions are too small to characterize. There is heterogeneous enhancement of the right renal parenchyma with areas of decreased nephrogram consistent with right-sided pyelonephritis. No abscess. Correlation with urinalysis recommended. Mild left pyelonephritis may also be present. The visualized ureters appear unremarkable. The urinary bladder is grossly unremarkable. Stomach/Bowel: There is no bowel obstruction or active inflammation. The appendix is normal. Vascular/Lymphatic: Moderate aortoiliac atherosclerotic disease. The IVC is unremarkable. No portal venous gas. There is no adenopathy. Reproductive: Mildly enlarged prostate gland measuring 5.5 cm in transverse axial diameter. The seminal vesicles are symmetric. Other: None Musculoskeletal: Degenerative changes of the spine. No acute osseous pathology. Review of the MIP images confirms the above findings. IMPRESSION: 1. No acute intrathoracic pathology. No CT evidence of pulmonary artery embolus. 2. Right-sided pyelonephritis. Correlation with urinalysis recommended. No abscess. 3. Nonobstructing bilateral renal calculi. No hydronephrosis. Aortic Atherosclerosis (ICD10-I70.0) and Emphysema (ICD10-J43.9). Electronically Signed   By: Anner Crete M.D.   On: 02/14/2019 22:32   Dg Chest Port 1 View  Result Date: 02/14/2019 CLINICAL DATA:  Fever and chills. Bladder surgery 2 weeks ago. History of COPD and hypertension. EXAM: PORTABLE CHEST 1 VIEW COMPARISON:  04/07/2017 FINDINGS: Normal sized heart. Tortuous aorta. Clear lungs. The lungs remain hyperexpanded. Stable mild elevation of the left hemidiaphragm. Unremarkable bones. IMPRESSION: No acute abnormality.  Stable changes of COPD. Electronically Signed   By: Claudie Revering M.D.   On: 02/14/2019 21:47    Procedures .Critical Care Performed by: Nettie Elm, PA-C Authorized by: Nettie Elm, PA-C   Critical care provider statement:    Critical care time (minutes):  45   Critical  care time was exclusive of:  Separately billable procedures and treating other patients and teaching time   Critical care was necessary to treat or prevent imminent or life-threatening deterioration of the following conditions:  Sepsis   Critical care was time spent personally by me on the following activities:  Discussions with consultants, evaluation of patient's response to treatment, examination of patient, ordering and performing treatments and interventions, ordering and review of laboratory studies, ordering and review of radiographic studies, pulse oximetry, re-evaluation of patient's condition, obtaining history from patient or  surrogate and review of old charts   (including critical care time)  Medications Ordered in ED Medications  atorvastatin (LIPITOR) tablet 20 mg (has no administration in time range)  tamsulosin (FLOMAX) capsule 0.4 mg (has no administration in time range)  gabapentin (NEURONTIN) capsule 300 mg (has no administration in time range)  fluticasone furoate-vilanterol (BREO ELLIPTA) 100-25 MCG/INH 1 puff (has no administration in time range)  ipratropium-albuterol (DUONEB) 0.5-2.5 (3) MG/3ML nebulizer solution 3 mL (has no administration in time range)  enoxaparin (LOVENOX) injection 40 mg (has no administration in time range)  acetaminophen (TYLENOL) tablet 650 mg (has no administration in time range)    Or  acetaminophen (TYLENOL) suppository 650 mg (has no administration in time range)  cefTRIAXone (ROCEPHIN) 1 g in sodium chloride 0.9 % 100 mL IVPB (has no administration in time range)  0.9 %  sodium chloride infusion (has no administration in time range)  hydrALAZINE (APRESOLINE) injection 5 mg (has no administration in time range)  ceFEPIme (MAXIPIME) 2 g in sodium chloride 0.9 % 100 mL IVPB (0 g Intravenous Stopped 02/14/19 2223)  metroNIDAZOLE (FLAGYL) IVPB 500 mg (0 mg Intravenous Stopped 02/14/19 2325)  vancomycin (VANCOCIN) IVPB 1000 mg/200 mL premix (0 mg  Intravenous Stopped 02/14/19 2325)  sodium chloride 0.9 % bolus 1,000 mL (0 mLs Intravenous Stopped 02/14/19 2251)  acetaminophen (TYLENOL) tablet 650 mg (650 mg Oral Given 02/14/19 2148)  iohexol (OMNIPAQUE) 350 MG/ML injection 80 mL (80 mLs Intravenous Contrast Given 02/14/19 2206)  morphine 4 MG/ML injection 4 mg (4 mg Intravenous Given 02/14/19 2233)    Initial Impression / Assessment and Plan / ED Course  I have reviewed the triage vital signs and the nursing notes.  Pertinent labs & imaging results that were available during my care of the patient were reviewed by me and considered in my medical decision making (see chart for details).  73 year old male appears nontoxic but appears to not feel well presents for evaluation of decreased appetite fever and fatigue at home.  Mild dysuria over the last week. He is 2 weeks postop resection of bladder tumor from Dr. Gloriann Loan.  Patient arrived febrile, tachycardic and tachypneic.  Has had some intermittent shortness of breath and chest pain.  Patient denies exertional or pleuritic chest pain.  No associated lightheadedness, dizziness radiation of pain.  No evidence of DVT on exam.  Labs obtained from triage with code sepsis called on my initial evaluation with broad-spectrum antibiotics. Will add troponin, EKG given patient's chest pain.  CBC with leukocytosis at 23.1 with left shift Metabolic panel mild hyponatremia at 130, given IV fluids, creatinine 1.50, transaminitis with elevated alk phos.  He has negative Murphy sign and no tenderness to his right upper quadrant to suggest choledocholithiasis, cholangitis. Given no pain to RUQ will defer imaging to admitting service. Lactic acid 1.2 INR 1.2 COVID negative Blood cultures pending Urinalysis positive for infection, will culture  CTAP with right pyelonephritis, no stones, obstruction CTA chest negative for PE  2045: On reevaluation patient without any chest, abdominal or back pain.  Discussed with  patient and family in room findings of CT scan of pyelonephritis as well as urine. Will need admission for sepsis due to likely urinary source.  No evidence of stones, obstruction on CT scan. Given no current chest pain on reevaluation, No EKG changes possible demand ischemia given tachycardia, will need trending of trop inpatient.  2315: Consulted with Dr.Rathore, with [facility] who agrees to evaluate patient for admission.  The patient  appears reasonably stabilized for admission considering the current resources, flow, and capabilities available in the ED at this time, and I doubt any other [facility] requiring further screening and/or treatment in the ED prior to admission.    Connor Tejuan Gholson Sr. was evaluated in Emergency Department on 02/14/2019 for the symptoms described in the history of present illness. He was evaluated in the context of the global COVID-19 pandemic, which necessitated consideration that the patient might be at risk for infection with the SARS-CoV-2 virus that causes COVID-19. Institutional protocols and algorithms that pertain to the evaluation of patients at risk for COVID-19 are in a state of rapid change based on information released by regulatory bodies including the CDC and federal and state organizations. These policies and algorithms were followed during the patient's care in the ED.   Final Clinical Impressions(s) / ED Diagnoses   Final diagnoses:  Sepsis, due to unspecified organism, unspecified whether acute organ dysfunction present [facility])  Elevated LFTs  Precordial pain  Elevated troponin  Pyelonephritis  Cystitis    ED Discharge Orders    None       Dharma Pare A, PA-C 02/14/19 2326    Veryl Speak, MD 02/14/19 2328    Crosby Oriordan A, PA-C 02/15/19 0000    Veryl Speak, MD 02/15/19 1624

## 2019-02-14 NOTE — ED Notes (Signed)
Pt aware that we need UA

## 2019-02-15 DIAGNOSIS — E872 Acidosis, unspecified: Secondary | ICD-10-CM

## 2019-02-15 DIAGNOSIS — A419 Sepsis, unspecified organism: Secondary | ICD-10-CM

## 2019-02-15 DIAGNOSIS — E871 Hypo-osmolality and hyponatremia: Secondary | ICD-10-CM

## 2019-02-15 DIAGNOSIS — N179 Acute kidney failure, unspecified: Secondary | ICD-10-CM

## 2019-02-15 LAB — COMPREHENSIVE METABOLIC PANEL
ALT: 40 U/L (ref 0–44)
AST: 39 U/L (ref 15–41)
Albumin: 2.6 g/dL — ABNORMAL LOW (ref 3.5–5.0)
Alkaline Phosphatase: 124 U/L (ref 38–126)
Anion gap: 11 (ref 5–15)
BUN: 14 mg/dL (ref 8–23)
CO2: 19 mmol/L — ABNORMAL LOW (ref 22–32)
Calcium: 7.9 mg/dL — ABNORMAL LOW (ref 8.9–10.3)
Chloride: 103 mmol/L (ref 98–111)
Creatinine, Ser: 1.48 mg/dL — ABNORMAL HIGH (ref 0.61–1.24)
GFR calc Af Amer: 53 mL/min — ABNORMAL LOW (ref >60)
GFR calc non Af Amer: 46 mL/min — ABNORMAL LOW (ref >60)
Glucose, Bld: 113 mg/dL — ABNORMAL HIGH (ref 70–99)
Potassium: 4.1 mmol/L (ref 3.5–5.1)
Sodium: 133 mmol/L — ABNORMAL LOW (ref 135–145)
Total Bilirubin: 1 mg/dL (ref 0.3–1.2)
Total Protein: 6.2 g/dL — ABNORMAL LOW (ref 6.5–8.1)

## 2019-02-15 LAB — URINE CULTURE: Culture: NO GROWTH

## 2019-02-15 LAB — CBC
HCT: 36.3 % — ABNORMAL LOW (ref 39.0–52.0)
Hemoglobin: 12.3 g/dL — ABNORMAL LOW (ref 13.0–17.0)
MCH: 29.4 pg (ref 26.0–34.0)
MCHC: 33.9 g/dL (ref 30.0–36.0)
MCV: 86.8 fL (ref 80.0–100.0)
Platelets: 344 10*3/uL (ref 150–400)
RBC: 4.18 MIL/uL — ABNORMAL LOW (ref 4.22–5.81)
RDW: 16.3 % — ABNORMAL HIGH (ref 11.5–15.5)
WBC: 24.2 10*3/uL — ABNORMAL HIGH (ref 4.0–10.5)
nRBC: 0 % (ref 0.0–0.2)

## 2019-02-15 LAB — TROPONIN I (HIGH SENSITIVITY): Troponin I (High Sensitivity): 34 ng/L — ABNORMAL HIGH (ref <18)

## 2019-02-15 MED ORDER — AMLODIPINE BESYLATE 10 MG PO TABS
10.0000 mg | ORAL_TABLET | Freq: Every day | ORAL | Status: DC
  Administered 2019-02-15 – 2019-02-18 (×4): 10 mg via ORAL
  Filled 2019-02-15 (×4): qty 1

## 2019-02-15 MED ORDER — DOCUSATE SODIUM 100 MG PO CAPS: 100.0000 mg | ORAL_CAPSULE | Freq: Two times a day (BID) | ORAL | Status: DC | PRN

## 2019-02-15 MED ORDER — ALUM & MAG HYDROXIDE-SIMETH 200-200-20 MG/5ML PO SUSP
15.0000 mL | Freq: Four times a day (QID) | ORAL | Status: DC | PRN
  Administered 2019-02-15: 15 mL via ORAL
  Filled 2019-02-15: qty 30

## 2019-02-15 NOTE — Progress Notes (Signed)
PROGRESS NOTE    Connor Hurford Sr.  [id number] DOB: 09/02/1945 DOA: 02/14/2019 PCP: Dorothyann Peng, NP   Brief Narrative:  HPI On 02/14/2019 by Dr. Shela Leff Connor Pontiff Sr. is a 73 y.o. male with medical history significant of bladder cancer status post transurethral resection of bladder tumor and intravesical instillation of chemotherapy on 02/02/2019, COPD, hypertension, CKD stage [name], anxiety, chronic back pain presenting to the hospital for evaluation of fever and fatigue.  Patient reports 1 week history of fevers, fatigue, and decreased appetite.  Denies abdominal pain, nausea, vomiting, or diarrhea.  He has been having dysuria and not urinating much.  He is having right-sided flank pain.  Reports having a chronic cough and shortness of breath secondary to COPD.  States earlier today he had a "slight spasm" located in the middle of his chest at rest which lasted for very short time.  He was not having any nausea, diaphoresis, or dyspnea at that time.  Denies any chest pain at present.  Interim history Admitted for sepsis secondary to pyelonephritis. Currently on IVF and IV antibiotics. Pending cultures.  Assessment & Plan   Sepsis secondary to pyelonephritis -Patient presented with fever, tachycardia, tachypnea, leukocytosis -UA: rare bacteria, moderate leukocytes, 6-10WBC  -CT showing right-sided pyelonephritis, with no abscess -Patient received vancomycin, cefepime, Flagyl in the emergency department for sepsis secondary to an unknown source.  However antibiotic coverage has been weaned to ceftriaxone -Blood culture and urine cultures pending -Continue IV fluids  Chronic kidney disease, stage [name] -Baseline creatinine approximately 1.3 -Creatinine on admission, 1.5, down to 1.48 today -continue IVF and monitor   Mild hyponatremia -Likely secondary to poor oral intake as patient states he has not been able to eat or drink anything for 6 days prior to admission  -Sodium up to 133 -Continue IVF and monitor   Metabolic acidosis -with normal anion gap -likely related to the above -Continue IVF  Elevated LFTs -resolved  Chest discomfort -no longer complaining of pain or discomfort -occurred prior to admission -felt it was a spasm -High-sensitivity troponin 31, 34 - flat- likely demand ischemia due to sepsis -EKG not suggestive of ACS -CTA chest negative for PE  COPD -Stable, currently in no wheezing -Continue home medications  Essential hypertension -will restart amlodipine today  DVT Prophylaxis  lovenox  Code Status: DNR  Family Communication: None at bedside  Disposition Plan: Admitted. Pending culture results and improvement in symptoms  Consultants None  Procedures  none  Antibiotics   Anti-infectives (From admission, onward)   Start     Dose/Rate Route Frequency Ordered Stop   02/15/19 2100  vancomycin (VANCOCIN) IVPB 1000 mg/200 mL premix  Status:  Discontinued     1,000 mg 200 mL/hr over 60 Minutes Intravenous Every 24 hours 02/14/19 2112 02/14/19 2323   02/15/19 2100  cefTRIAXone (ROCEPHIN) 1 g in sodium chloride 0.9 % 100 mL IVPB     1 g 200 mL/hr over 30 Minutes Intravenous Every 24 hours 02/14/19 2323     02/15/19 1000  ceFEPIme (MAXIPIME) 2 g in sodium chloride 0.9 % 100 mL IVPB  Status:  Discontinued     2 g 200 mL/hr over 30 Minutes Intravenous Every 12 hours 02/14/19 2112 02/14/19 2323   02/14/19 2115  ceFEPIme (MAXIPIME) 2 g in sodium chloride 0.9 % 100 mL IVPB     2 g 200 mL/hr over 30 Minutes Intravenous  Once 02/14/19 2102 02/14/19 2223   02/14/19 2115  metroNIDAZOLE (FLAGYL) IVPB  500 mg     500 mg 100 mL/hr over 60 Minutes Intravenous  Once 02/14/19 2102 02/14/19 2325   02/14/19 2115  vancomycin (VANCOCIN) IVPB 1000 mg/200 mL premix     1,000 mg 200 mL/hr over 60 Minutes Intravenous  Once 02/14/19 2102 02/14/19 2325      Subjective:   Arush Garceau seen and examined today.  Continue so to  some right sided back pain. States he has not eaten in days due to pain and not feeling well. Had chills and shakes this morning. Denies current chest pain, shortness of breath, abdominal pain, N/V/D/C, dizziness or headache.   Objective:   Vitals:   02/14/19 2101 02/14/19 2314 02/15/19 0047 02/15/19 0517  BP: (!) 177/78 (!) 146/66 (!) 163/78 (!) 152/70  Pulse: (!) 107 89 88 96  Resp: (!) 22 20 16 20   Temp: 100.1 F (37.8 C)  98.4 F (36.9 C) 98.4 F (36.9 C)  TempSrc: Oral Oral Oral Oral  SpO2: 97% 94% 95% 96%    Intake/Output Summary (Last 24 hours) at 02/15/2019 0913 Last data filed at 02/15/2019 0500 Gross per 24 hour  Intake 423.48 ml  Output 1350 ml  Net -926.52 ml   There were no vitals filed for this visit.  Exam  General: Well developed, well nourished, NAD, appears stated age  72: NCAT, mucous membranes moist.   Neck: Supple  Cardiovascular: S1 S2 auscultated, no rubs, murmurs or gallops. Regular rate and rhythm.  Respiratory: Clear to auscultation bilaterally  Abdomen: Soft, nontender, nondistended, + bowel sounds  Extremities: warm dry without cyanosis clubbing or edema  Neuro: AAOx3, nonfocal  Psych: Appropriate mood and affect   Data Reviewed: I have personally reviewed following labs and imaging studies  CBC: Recent Labs  Lab 02/14/19 1951 02/15/19 0509  WBC 23.1* 24.2*  NEUTROABS 18.7*  --   HGB 13.6 12.3*  HCT 39.3 36.3*  MCV 87.5 86.8  PLT 354 [id number]   Basic Metabolic Panel: Recent Labs  Lab 02/14/19 1951 02/15/19 0509  NA 130* 133*  K 3.6 4.1  CL 99 103  CO2 16* 19*  GLUCOSE 119* 113*  BUN 14 14  CREATININE 1.50* 1.48*  CALCIUM 8.6* 7.9*   GFR: Estimated Creatinine Clearance: 40.9 mL/min (A) (by C-G formula based on SCr of 1.48 mg/dL (H)). Liver Function Tests: Recent Labs  Lab 02/14/19 1951 02/15/19 0509  AST 74* 39  ALT 54* 40  ALKPHOS 146* 124  BILITOT 1.2 1.0  PROT 6.7 6.2*  ALBUMIN 3.2* 2.6*   No results for  input(s): LIPASE, AMYLASE in the last 168 hours. No results for input(s): AMMONIA in the last 168 hours. Coagulation Profile: Recent Labs  Lab 02/14/19 2106  INR 1.2   Cardiac Enzymes: No results for input(s): CKTOTAL, CKMB, CKMBINDEX, TROPONINI in the last 168 hours. BNP (last 3 results) No results for input(s): PROBNP in the last 8760 hours. HbA1C: No results for input(s): HGBA1C in the last 72 hours. CBG: No results for input(s): GLUCAP in the last 168 hours. Lipid Profile: No results for input(s): CHOL, HDL, LDLCALC, TRIG, CHOLHDL, LDLDIRECT in the last 72 hours. Thyroid Function Tests: No results for input(s): TSH, T4TOTAL, FREET4, T3FREE, THYROIDAB in the last 72 hours. Anemia Panel: No results for input(s): VITAMINB12, FOLATE, FERRITIN, TIBC, IRON, RETICCTPCT in the last 72 hours. Urine analysis:    Component Value Date/Time   COLORURINE YELLOW 02/14/2019 2149   APPEARANCEUR CLEAR 02/14/2019 2149   LABSPEC 1.013 02/14/2019 2149  PHURINE 6.0 02/14/2019 2149   GLUCOSEU NEGATIVE 02/14/2019 2149   HGBUR SMALL (A) 02/14/2019 2149   BILIRUBINUR NEGATIVE 02/14/2019 2149   KETONESUR 20 (A) 02/14/2019 2149   PROTEINUR 100 (A) 02/14/2019 2149   UROBILINOGEN 0.2 11/07/2013 0211   NITRITE NEGATIVE 02/14/2019 2149   LEUKOCYTESUR MODERATE (A) 02/14/2019 2149   Sepsis Labs: @LABRCNTIP (procalcitonin:4,lacticidven:4)  ) Recent Results (from the past 240 hour(s))  Blood Culture (routine x 2)     Status: None (Preliminary result)   Collection Time: 02/14/19  9:06 PM   Specimen: BLOOD LEFT WRIST  Result Value Ref Range Status   Specimen Description BLOOD LEFT WRIST  Final   Special Requests   Final    BOTTLES DRAWN AEROBIC AND ANAEROBIC Blood Culture adequate volume   Culture   Final    NO GROWTH < 12 HOURS Performed at [facility], [place] [address] [place], [place] [address]    Report Status PENDING  Incomplete  SARS Coronavirus 2 [facility] order, Performed in [facility] hospital lab) Nasopharyngeal Nasopharyngeal Swab     Status: None   Collection Time: 02/14/19  9:37 PM   Specimen: Nasopharyngeal Swab  Result Value Ref Range Status   SARS Coronavirus 2 NEGATIVE NEGATIVE Final    Comment: (NOTE) If result is NEGATIVE SARS-CoV-2 target nucleic acids are NOT DETECTED. The SARS-CoV-2 RNA is generally detectable in upper and lower  respiratory specimens during the acute phase of infection. The lowest  concentration of SARS-CoV-2 viral copies this assay can detect is 250  copies / mL. A negative result does not preclude SARS-CoV-2 infection  and should not be used as the sole basis for treatment or other  patient management decisions.  A negative result may occur with  improper specimen collection / handling, submission of specimen other  than nasopharyngeal swab, presence of viral mutation(s) within the  areas targeted by this assay, and inadequate number of viral copies  (<250 copies / mL). A negative result must be combined with clinical  observations, patient history, and epidemiological information. If result is POSITIVE SARS-CoV-2 target nucleic acids are DETECTED. The SARS-CoV-2 RNA is generally detectable in upper and lower  respiratory specimens dur ing the acute phase of infection.  Positive  results are indicative of active infection with SARS-CoV-2.  Clinical  correlation with patient history and other diagnostic information is  necessary to determine patient infection status.  Positive results do  not rule out bacterial infection or co-infection with other viruses. If result is PRESUMPTIVE POSTIVE SARS-CoV-2 nucleic acids MAY BE PRESENT.   A presumptive positive result was obtained on the submitted specimen  and confirmed on repeat testing.  While 2019 novel coronavirus  (SARS-CoV-2) nucleic acids may be present in the submitted sample  additional confirmatory testing may be necessary for epidemiological  and / or clinical management  purposes  to differentiate between  SARS-CoV-2 and other Sarbecovirus currently known to infect humans.  If clinically indicated additional testing with an alternate test  methodology [phone]) is advised. The SARS-CoV-2 RNA is generally  detectable in upper and lower respiratory sp ecimens during the acute  phase of infection. The expected result is Negative. Fact Sheet for Patients:  StrictlyIdeas.no Fact Sheet for Healthcare Providers: BankingDealers.co.za This test is not yet approved or cleared by the [place] FDA and has been authorized for detection and/or diagnosis of SARS-CoV-2 by FDA under an Emergency Use Authorization (EUA).  This EUA will remain in effect (meaning this test can  be used) for the duration of the COVID-19 declaration under Section 564(b)(1) of the Act, 21 U.S.C. section 360bbb-3(b)(1), unless the authorization is terminated or revoked sooner. Performed at [facility], [place] [address], [place], [place] [address]   Blood Culture (routine x 2)     Status: None (Preliminary result)   Collection Time: 02/14/19  9:48 PM   Specimen: BLOOD RIGHT ARM  Result Value Ref Range Status   Specimen Description BLOOD RIGHT ARM  Final   Special Requests   Final    BOTTLES DRAWN AEROBIC AND ANAEROBIC Blood Culture adequate volume   Culture   Final    NO GROWTH < 12 HOURS Performed at [facility], [place] [address] [place], [place] [address]    Report Status PENDING  Incomplete      Radiology Studies: Ct Angio Chest Pe W/cm &/or Wo Cm  Result Date: 02/14/2019 CLINICAL DATA:  73 year old male with fever, chills, loss of appetite. EXAM: CT ANGIOGRAPHY CHEST CT ABDOMEN AND PELVIS WITH CONTRAST TECHNIQUE: Multidetector CT imaging of the chest was performed using the standard protocol during bolus administration of intravenous contrast. Multiplanar CT image reconstructions and MIPs were obtained to evaluate the  vascular anatomy. Multidetector CT imaging of the abdomen and pelvis was performed using the standard protocol during bolus administration of intravenous contrast. CONTRAST:  64mL OMNIPAQUE IOHEXOL 350 MG/ML SOLN COMPARISON:  Chest radiograph dated 02/14/2019. FINDINGS: CTA CHEST FINDINGS Cardiovascular: There is no cardiomegaly or pericardial effusion. Coronary vascular calcification of the LAD. Moderate calcified and noncalcified plaque of the aortic wall. No aneurysmal dilatation or dissection. The origins of the great vessels of the aortic arch appear patent as visualized. No pulmonary artery embolus identified. Mediastinum/Nodes: There is no hilar or mediastinal adenopathy. The esophagus and the thyroid gland are grossly unremarkable. No mediastinal fluid collection. Lungs/Pleura: There is mild centrilobular emphysema. Bibasilar subpleural linear atelectasis/scarring. There is no focal consolidation, pleural effusion, or pneumothorax. The central airways are patent. Musculoskeletal: Degenerative changes of the spine. No acute osseous pathology. Review of the MIP images confirms the above findings. CT ABDOMEN and PELVIS FINDINGS No intra-abdominal free air or free fluid. Hepatobiliary: The liver is unremarkable. No intrahepatic biliary ductal dilatation. No calcified gallstone or pericholecystic fluid. Pancreas: Unremarkable. No pancreatic ductal dilatation or surrounding inflammatory changes. Spleen: Splenectomy. Adrenals/Urinary Tract: There is a 5 mm nonobstructing right renal inferior pole calculus. There is a 3 mm nonobstructing left renal inferior pole calculus. There is no hydronephrosis on either side. Multiple bilateral renal hypodense lesions measure up to 15 mm in the upper pole of the right kidney. Larger lesions demonstrate fluid attenuation most consistent with cysts and the smaller lesions are too small to characterize. There is heterogeneous enhancement of the right renal parenchyma with areas of  decreased nephrogram consistent with right-sided pyelonephritis. No abscess. Correlation with urinalysis recommended. Mild left pyelonephritis may also be present. The visualized ureters appear unremarkable. The urinary bladder is grossly unremarkable. Stomach/Bowel: There is no bowel obstruction or active inflammation. The appendix is normal. Vascular/Lymphatic: Moderate aortoiliac atherosclerotic disease. The IVC is unremarkable. No portal venous gas. There is no adenopathy. Reproductive: Mildly enlarged prostate gland measuring 5.5 cm in transverse axial diameter. The seminal vesicles are symmetric. Other: None Musculoskeletal: Degenerative changes of the spine. No acute osseous pathology. Review of the MIP images confirms the above findings. IMPRESSION: 1. No acute intrathoracic pathology. No CT evidence of pulmonary artery embolus. 2. Right-sided pyelonephritis. Correlation with urinalysis recommended. No abscess.  3. Nonobstructing bilateral renal calculi. No hydronephrosis. Aortic Atherosclerosis (ICD10-I70.0) and Emphysema (ICD10-J43.9). Electronically Signed   By: Anner Crete M.D.   On: 02/14/2019 22:32   Ct Abdomen Pelvis W Contrast  Result Date: 02/14/2019 CLINICAL DATA:  73 year old male with fever, chills, loss of appetite. EXAM: CT ANGIOGRAPHY CHEST CT ABDOMEN AND PELVIS WITH CONTRAST TECHNIQUE: Multidetector CT imaging of the chest was performed using the standard protocol during bolus administration of intravenous contrast. Multiplanar CT image reconstructions and MIPs were obtained to evaluate the vascular anatomy. Multidetector CT imaging of the abdomen and pelvis was performed using the standard protocol during bolus administration of intravenous contrast. CONTRAST:  69mL OMNIPAQUE IOHEXOL 350 MG/ML SOLN COMPARISON:  Chest radiograph dated 02/14/2019. FINDINGS: CTA CHEST FINDINGS Cardiovascular: There is no cardiomegaly or pericardial effusion. Coronary vascular calcification of the LAD.  Moderate calcified and noncalcified plaque of the aortic wall. No aneurysmal dilatation or dissection. The origins of the great vessels of the aortic arch appear patent as visualized. No pulmonary artery embolus identified. Mediastinum/Nodes: There is no hilar or mediastinal adenopathy. The esophagus and the thyroid gland are grossly unremarkable. No mediastinal fluid collection. Lungs/Pleura: There is mild centrilobular emphysema. Bibasilar subpleural linear atelectasis/scarring. There is no focal consolidation, pleural effusion, or pneumothorax. The central airways are patent. Musculoskeletal: Degenerative changes of the spine. No acute osseous pathology. Review of the MIP images confirms the above findings. CT ABDOMEN and PELVIS FINDINGS No intra-abdominal free air or free fluid. Hepatobiliary: The liver is unremarkable. No intrahepatic biliary ductal dilatation. No calcified gallstone or pericholecystic fluid. Pancreas: Unremarkable. No pancreatic ductal dilatation or surrounding inflammatory changes. Spleen: Splenectomy. Adrenals/Urinary Tract: There is a 5 mm nonobstructing right renal inferior pole calculus. There is a 3 mm nonobstructing left renal inferior pole calculus. There is no hydronephrosis on either side. Multiple bilateral renal hypodense lesions measure up to 15 mm in the upper pole of the right kidney. Larger lesions demonstrate fluid attenuation most consistent with cysts and the smaller lesions are too small to characterize. There is heterogeneous enhancement of the right renal parenchyma with areas of decreased nephrogram consistent with right-sided pyelonephritis. No abscess. Correlation with urinalysis recommended. Mild left pyelonephritis may also be present. The visualized ureters appear unremarkable. The urinary bladder is grossly unremarkable. Stomach/Bowel: There is no bowel obstruction or active inflammation. The appendix is normal. Vascular/Lymphatic: Moderate aortoiliac  atherosclerotic disease. The IVC is unremarkable. No portal venous gas. There is no adenopathy. Reproductive: Mildly enlarged prostate gland measuring 5.5 cm in transverse axial diameter. The seminal vesicles are symmetric. Other: None Musculoskeletal: Degenerative changes of the spine. No acute osseous pathology. Review of the MIP images confirms the above findings. IMPRESSION: 1. No acute intrathoracic pathology. No CT evidence of pulmonary artery embolus. 2. Right-sided pyelonephritis. Correlation with urinalysis recommended. No abscess. 3. Nonobstructing bilateral renal calculi. No hydronephrosis. Aortic Atherosclerosis (ICD10-I70.0) and Emphysema (ICD10-J43.9). Electronically Signed   By: Anner Crete M.D.   On: 02/14/2019 22:32   Dg Chest Port 1 View  Result Date: 02/14/2019 CLINICAL DATA:  Fever and chills. Bladder surgery 2 weeks ago. History of COPD and hypertension. EXAM: PORTABLE CHEST 1 VIEW COMPARISON:  04/07/2017 FINDINGS: Normal sized heart. Tortuous aorta. Clear lungs. The lungs remain hyperexpanded. Stable mild elevation of the left hemidiaphragm. Unremarkable bones. IMPRESSION: No acute abnormality.  Stable changes of COPD. Electronically Signed   By: Claudie Revering M.D.   On: 02/14/2019 21:47   US Abdomen Limited Ruq  Result Date: 02/15/2019 CLINICAL DATA:  Elevated LFTs EXAM: ULTRASOUND ABDOMEN LIMITED RIGHT UPPER QUADRANT COMPARISON:  CT 02/14/2019 FINDINGS: Gallbladder: Ring down artifact from the anterior gallbladder wall compatible with adenomyomatosis. No stones or wall thickening. Common bile duct: Diameter: Normal caliber, 3 mm Liver: Increased echotexture compatible with fatty infiltration. No focal abnormality or biliary ductal dilatation. Portal vein is patent on color Doppler imaging with normal direction of blood flow towards the liver. Other: None. IMPRESSION: Fatty infiltration of the liver. Adenomyomatosis of the gallbladder wall. No cholelithiasis or acute cholecystitis.  Electronically Signed   By: Rolm Baptise M.D.   On: 02/15/2019 00:37     Scheduled Meds: . atorvastatin  20 mg Oral QHS  . enoxaparin (LOVENOX) injection  40 mg Subcutaneous Q24H  . fluticasone furoate-vilanterol  1 puff Inhalation Daily  . tamsulosin  0.4 mg Oral Daily   Continuous Infusions: . sodium chloride 125 mL/hr at 02/15/19 0136  . cefTRIAXone (ROCEPHIN)  IV       LOS: 1 day   Time Spent in minutes   30 minutes  Lilton Pare D.O. on 02/15/2019 at 9:13 AM  Between 7am to 7pm - Please see pager noted on [email or web address]  After 7pm go to [email or web address]  And look for the night coverage person covering for me after hours  [facility] Office  [phone]

## 2019-02-15 NOTE — ED Notes (Signed)
Patient transported to Ultrasound 

## 2019-02-15 NOTE — ED Notes (Signed)
Attempted to call report; nurse will call back (states that she was in a pt's room).

## 2019-02-15 NOTE — Plan of Care (Signed)
  Problem: Clinical Measurements: Goal: Ability to maintain clinical measurements within normal limits will improve Outcome: Progressing   Problem: Clinical Measurements: Goal: Ability to maintain clinical measurements within normal limits will improve Outcome: Progressing   Problem: Clinical Measurements: Goal: Ability to maintain clinical measurements within normal limits will improve Outcome: Progressing   

## 2019-02-16 MED ORDER — PNEUMOCOCCAL VAC POLYVALENT 25 MCG/0.5ML IJ INJ
0.5000 mL | INJECTION | Freq: Once | INTRAMUSCULAR | Status: AC
  Administered 2019-02-18: 0.5 mL via INTRAMUSCULAR

## 2019-02-16 MED ORDER — ENSURE ENLIVE PO LIQD
237.0000 mL | Freq: Two times a day (BID) | ORAL | Status: DC
  Administered 2019-02-16: 237 mL via ORAL

## 2019-02-16 MED ORDER — ADULT MULTIVITAMIN W/MINERALS CH
1.0000 | ORAL_TABLET | Freq: Every day | ORAL | Status: DC
  Administered 2019-02-16 – 2019-02-18 (×3): 1 via ORAL
  Filled 2019-02-16 (×3): qty 1

## 2019-02-16 MED ORDER — INFLUENZA VAC SPLIT QUAD 0.5 ML IM SUSY
0.5000 mL | PREFILLED_SYRINGE | INTRAMUSCULAR | Status: DC
  Filled 2019-02-16: qty 0.5

## 2019-02-16 NOTE — Progress Notes (Addendum)
PROGRESS NOTE    Connor Mear Sr.  [id number] DOB: 12/18/1945 DOA: 02/14/2019 PCP: Dorothyann Peng, NP   Brief Narrative:  HPI On 02/14/2019 by Dr. Shela Leff [name] Sr. is a 73 y.o. male with medical history significant of bladder cancer status post transurethral resection of bladder tumor and intravesical instillation of chemotherapy on 02/02/2019, COPD, hypertension, CKD stage [name], anxiety, chronic back pain presenting to the hospital for evaluation of fever and fatigue.  Patient reports 1 week history of fevers, fatigue, and decreased appetite.  Denies abdominal pain, nausea, vomiting, or diarrhea.  He has been having dysuria and not urinating much.  He is having right-sided flank pain.  Reports having a chronic cough and shortness of breath secondary to COPD. He was not having any nausea, diaphoresis, or dyspnea at that time. Admitted for sepsis secondary to pyelonephritis. Currently on IVF and IV antibiotics.     Assessment & Plan  Principal Problem:   Pyelonephritis Active Problems:   Sepsis ([place])   AKI (acute kidney injury) ([facility])   Hyponatremia   Metabolic acidosis   Sepsis secondary to pyelonephritis, POA, improving -Patient presented with fever, tachycardia, tachypnea, leukocytosis -UA: rare bacteria, moderate leukocytes, 6-10WBC  -CT showing right-sided pyelonephritis, with no abscess -Patient received vancomycin, cefepime, Flagyl in the emergency department for sepsis secondary to an unknown source.  However antibiotic coverage has been weaned to ceftriaxone -Blood culture and urine cultures currently pending -WBC continues to be markedly elevated (24) - follow with am labs.  Chronic kidney disease, stage [name] -Baseline creatinine approximately 1.3 -Creatinine on admission, 1.5, stable -off IVF - continue to increase PO intake of free water as tolerated  Mild hyponatremia, improving -Likely secondary to poor oral intake as patient states he has not  been able to eat or drink anything for 6 days prior to admission -Sodium up to 133, follow am labs -off IVF   Metabolic acidosis -with normal anion gap -likely related to above -off IVF today  Elevated LFTs -resolved  Atypical chest discomfort, resolved -occurred prior to admission -felt to be a muscle spasm -High-sensitivity troponin 31, 34 - flat- likely demand ischemia due to sepsis -EKG not suggestive of ACS -CTA chest negative for PE  COPD, not in exacerbation -Stable, currently in no wheezing -Continue home medications  Essential hypertension -continue amlodipine today  DVT Prophylaxis  lovenox  Code Status: DNR Family Communication: Daughter at bedside Disposition Plan: Admitted. Pending culture results and improvement in symptoms - likely CIR pending discussion with patient and family.  Consultants None  Procedures  none  Antibiotics   Anti-infectives (From admission, onward)   Start     Dose/Rate Route Frequency Ordered Stop   02/15/19 2100  vancomycin (VANCOCIN) IVPB 1000 mg/200 mL premix  Status:  Discontinued     1,000 mg 200 mL/hr over 60 Minutes Intravenous Every 24 hours 02/14/19 2112 02/14/19 2323   02/15/19 2100  cefTRIAXone (ROCEPHIN) 1 g in sodium chloride 0.9 % 100 mL IVPB     1 g 200 mL/hr over 30 Minutes Intravenous Every 24 hours 02/14/19 2323     02/15/19 1000  ceFEPIme (MAXIPIME) 2 g in sodium chloride 0.9 % 100 mL IVPB  Status:  Discontinued     2 g 200 mL/hr over 30 Minutes Intravenous Every 12 hours 02/14/19 2112 02/14/19 2323   02/14/19 2115  ceFEPIme (MAXIPIME) 2 g in sodium chloride 0.9 % 100 mL IVPB     2 g 200 mL/hr over 30  Minutes Intravenous  Once 02/14/19 2102 02/14/19 2223   02/14/19 2115  metroNIDAZOLE (FLAGYL) IVPB 500 mg     500 mg 100 mL/hr over 60 Minutes Intravenous  Once 02/14/19 2102 02/14/19 2325   02/14/19 2115  vancomycin (VANCOCIN) IVPB 1000 mg/200 mL premix     1,000 mg 200 mL/hr over 60 Minutes Intravenous   Once 02/14/19 2102 02/14/19 2325      Subjective:   No acute issues or events overnight, denies chest pain, shortness of breath, nausea, vomiting, diarrhea, constipation, headache, fevers, chills.  Objective:   Vitals:   02/15/19 0517 02/15/19 1549 02/15/19 2135 02/16/19 0511  BP: (!) 152/70 (!) 149/70 128/67 134/73  Pulse: 96 88 74 77  Resp: 20 18 18 18   Temp: 98.4 F (36.9 C) 97.9 F (36.6 C) 98.4 F (36.9 C) 97.9 F (36.6 C)  TempSrc: Oral Oral Oral Oral  SpO2: 96% 97% 96% 96%    Intake/Output Summary (Last 24 hours) at 02/16/2019 0752 Last data filed at 02/16/2019 0300 Gross per 24 hour  Intake 720 ml  Output 1350 ml  Net -630 ml   There were no vitals filed for this visit.  Exam  General: Well developed, well nourished, NAD, appears stated age  35: NCAT, mucous membranes moist.   Neck: Supple  Cardiovascular: S1 S2 auscultated, no rubs, murmurs or gallops. Regular rate and rhythm.  Respiratory: Clear to auscultation bilaterally  Abdomen: Soft, nontender, nondistended, + bowel sounds  Extremities: warm dry without cyanosis clubbing or edema  Neuro: AAOx3, nonfocal  Psych: Appropriate mood and affect   Data Reviewed: I have personally reviewed following labs and imaging studies  CBC: Recent Labs  Lab 02/14/19 1951 02/15/19 0509  WBC 23.1* 24.2*  NEUTROABS 18.7*  --   HGB 13.6 12.3*  HCT 39.3 36.3*  MCV 87.5 86.8  PLT 354 [id number]   Basic Metabolic Panel: Recent Labs  Lab 02/14/19 1951 02/15/19 0509  NA 130* 133*  K 3.6 4.1  CL 99 103  CO2 16* 19*  GLUCOSE 119* 113*  BUN 14 14  CREATININE 1.50* 1.48*  CALCIUM 8.6* 7.9*   GFR: Estimated Creatinine Clearance: 40.9 mL/min (A) (by C-G formula based on SCr of 1.48 mg/dL (H)).  Liver Function Tests: Recent Labs  Lab 02/14/19 1951 02/15/19 0509  AST 74* 39  ALT 54* 40  ALKPHOS 146* 124  BILITOT 1.2 1.0  PROT 6.7 6.2*  ALBUMIN 3.2* 2.6*   Coagulation Profile: Recent Labs  Lab  02/14/19 2106  INR 1.2   Urine analysis:    Component Value Date/Time   COLORURINE YELLOW 02/14/2019 2149   APPEARANCEUR CLEAR 02/14/2019 2149   LABSPEC 1.013 02/14/2019 2149   PHURINE 6.0 02/14/2019 2149   GLUCOSEU NEGATIVE 02/14/2019 2149   HGBUR SMALL (A) 02/14/2019 2149   BILIRUBINUR NEGATIVE 02/14/2019 2149   KETONESUR 20 (A) 02/14/2019 2149   PROTEINUR 100 (A) 02/14/2019 2149   UROBILINOGEN 0.2 11/07/2013 0211   NITRITE NEGATIVE 02/14/2019 2149   LEUKOCYTESUR MODERATE (A) 02/14/2019 2149   Recent Results (from the past 240 hour(s))  Blood Culture (routine x 2)     Status: None (Preliminary result)   Collection Time: 02/14/19  9:06 PM   Specimen: BLOOD LEFT WRIST  Result Value Ref Range Status   Specimen Description BLOOD LEFT WRIST  Final   Special Requests   Final    BOTTLES DRAWN AEROBIC AND ANAEROBIC Blood Culture adequate volume   Culture   Final  NO GROWTH 2 DAYS Performed at [facility], [place] [address] [place], [place] [address]    Report Status PENDING  Incomplete  SARS Coronavirus 2 [facility] order, Performed in [facility] hospital lab) Nasopharyngeal Nasopharyngeal Swab     Status: None   Collection Time: 02/14/19  9:37 PM   Specimen: Nasopharyngeal Swab  Result Value Ref Range Status   SARS Coronavirus 2 NEGATIVE NEGATIVE Final    Comment: (NOTE) If result is NEGATIVE SARS-CoV-2 target nucleic acids are NOT DETECTED. The SARS-CoV-2 RNA is generally detectable in upper and lower  respiratory specimens during the acute phase of infection. The lowest  concentration of SARS-CoV-2 viral copies this assay can detect is 250  copies / mL. A negative result does not preclude SARS-CoV-2 infection  and should not be used as the sole basis for treatment or other  patient management decisions.  A negative result may occur with  improper specimen collection / handling, submission of specimen other  than nasopharyngeal swab, presence of viral mutation(s)  within the  areas targeted by this assay, and inadequate number of viral copies  (<250 copies / mL). A negative result must be combined with clinical  observations, patient history, and epidemiological information. If result is POSITIVE SARS-CoV-2 target nucleic acids are DETECTED. The SARS-CoV-2 RNA is generally detectable in upper and lower  respiratory specimens dur ing the acute phase of infection.  Positive  results are indicative of active infection with SARS-CoV-2.  Clinical  correlation with patient history and other diagnostic information is  necessary to determine patient infection status.  Positive results do  not rule out bacterial infection or co-infection with other viruses. If result is PRESUMPTIVE POSTIVE SARS-CoV-2 nucleic acids MAY BE PRESENT.   A presumptive positive result was obtained on the submitted specimen  and confirmed on repeat testing.  While 2019 novel coronavirus  (SARS-CoV-2) nucleic acids may be present in the submitted sample  additional confirmatory testing may be necessary for epidemiological  and / or clinical management purposes  to differentiate between  SARS-CoV-2 and other Sarbecovirus currently known to infect humans.  If clinically indicated additional testing with an alternate test  methodology [phone]) is advised. The SARS-CoV-2 RNA is generally  detectable in upper and lower respiratory sp ecimens during the acute  phase of infection. The expected result is Negative. Fact Sheet for Patients:  StrictlyIdeas.no Fact Sheet for Healthcare Providers: BankingDealers.co.za This test is not yet approved or cleared by the [place] FDA and has been authorized for detection and/or diagnosis of SARS-CoV-2 by FDA under an Emergency Use Authorization (EUA).  This EUA will remain in effect (meaning this test can be used) for the duration of the COVID-19 declaration under Section 564(b)(1) of the Act,  21 U.S.C. section 360bbb-3(b)(1), unless the authorization is terminated or revoked sooner. Performed at [facility], [place] [address] [place], [place] [address]   Blood Culture (routine x 2)     Status: None (Preliminary result)   Collection Time: 02/14/19  9:48 PM   Specimen: BLOOD RIGHT ARM  Result Value Ref Range Status   Specimen Description BLOOD RIGHT ARM  Final   Special Requests   Final    BOTTLES DRAWN AEROBIC AND ANAEROBIC Blood Culture adequate volume   Culture   Final    NO GROWTH 2 DAYS Performed at [facility], [place] [address] [place], [place] [address]    Report Status PENDING  Incomplete  Urine culture  Status: None   Collection Time: 02/14/19  9:49 PM   Specimen: In/Out Cath Urine  Result Value Ref Range Status   Specimen Description IN/OUT CATH URINE  Final   Special Requests NONE  Final   Culture   Final    NO GROWTH Performed at [facility], [place] [address], [place], [place] [address]    Report Status 02/15/2019 FINAL  Final      Radiology Studies: Ct Angio Chest Pe W/cm &/or Wo Cm  Result Date: 02/14/2019 CLINICAL DATA:  73 year old male with fever, chills, loss of appetite. EXAM: CT ANGIOGRAPHY CHEST CT ABDOMEN AND PELVIS WITH CONTRAST TECHNIQUE: Multidetector CT imaging of the chest was performed using the standard protocol during bolus administration of intravenous contrast. Multiplanar CT image reconstructions and MIPs were obtained to evaluate the vascular anatomy. Multidetector CT imaging of the abdomen and pelvis was performed using the standard protocol during bolus administration of intravenous contrast. CONTRAST:  45mL OMNIPAQUE IOHEXOL 350 MG/ML SOLN COMPARISON:  Chest radiograph dated 02/14/2019. FINDINGS: CTA CHEST FINDINGS Cardiovascular: There is no cardiomegaly or pericardial effusion. Coronary vascular calcification of the LAD. Moderate calcified and noncalcified plaque of the aortic wall. No aneurysmal dilatation or  dissection. The origins of the great vessels of the aortic arch appear patent as visualized. No pulmonary artery embolus identified. Mediastinum/Nodes: There is no hilar or mediastinal adenopathy. The esophagus and the thyroid gland are grossly unremarkable. No mediastinal fluid collection. Lungs/Pleura: There is mild centrilobular emphysema. Bibasilar subpleural linear atelectasis/scarring. There is no focal consolidation, pleural effusion, or pneumothorax. The central airways are patent. Musculoskeletal: Degenerative changes of the spine. No acute osseous pathology. Review of the MIP images confirms the above findings. CT ABDOMEN and PELVIS FINDINGS No intra-abdominal free air or free fluid. Hepatobiliary: The liver is unremarkable. No intrahepatic biliary ductal dilatation. No calcified gallstone or pericholecystic fluid. Pancreas: Unremarkable. No pancreatic ductal dilatation or surrounding inflammatory changes. Spleen: Splenectomy. Adrenals/Urinary Tract: There is a 5 mm nonobstructing right renal inferior pole calculus. There is a 3 mm nonobstructing left renal inferior pole calculus. There is no hydronephrosis on either side. Multiple bilateral renal hypodense lesions measure up to 15 mm in the upper pole of the right kidney. Larger lesions demonstrate fluid attenuation most consistent with cysts and the smaller lesions are too small to characterize. There is heterogeneous enhancement of the right renal parenchyma with areas of decreased nephrogram consistent with right-sided pyelonephritis. No abscess. Correlation with urinalysis recommended. Mild left pyelonephritis may also be present. The visualized ureters appear unremarkable. The urinary bladder is grossly unremarkable. Stomach/Bowel: There is no bowel obstruction or active inflammation. The appendix is normal. Vascular/Lymphatic: Moderate aortoiliac atherosclerotic disease. The IVC is unremarkable. No portal venous gas. There is no adenopathy.  Reproductive: Mildly enlarged prostate gland measuring 5.5 cm in transverse axial diameter. The seminal vesicles are symmetric. Other: None Musculoskeletal: Degenerative changes of the spine. No acute osseous pathology. Review of the MIP images confirms the above findings. IMPRESSION: 1. No acute intrathoracic pathology. No CT evidence of pulmonary artery embolus. 2. Right-sided pyelonephritis. Correlation with urinalysis recommended. No abscess. 3. Nonobstructing bilateral renal calculi. No hydronephrosis. Aortic Atherosclerosis (ICD10-I70.0) and Emphysema (ICD10-J43.9). Electronically Signed   By: Anner Crete M.D.   On: 02/14/2019 22:32   Ct Abdomen Pelvis W Contrast  Result Date: 02/14/2019 CLINICAL DATA:  73 year old male with fever, chills, loss of appetite. EXAM: CT ANGIOGRAPHY CHEST CT ABDOMEN AND PELVIS WITH CONTRAST TECHNIQUE: Multidetector CT imaging of the chest was  performed using the standard protocol during bolus administration of intravenous contrast. Multiplanar CT image reconstructions and MIPs were obtained to evaluate the vascular anatomy. Multidetector CT imaging of the abdomen and pelvis was performed using the standard protocol during bolus administration of intravenous contrast. CONTRAST:  71mL OMNIPAQUE IOHEXOL 350 MG/ML SOLN COMPARISON:  Chest radiograph dated 02/14/2019. FINDINGS: CTA CHEST FINDINGS Cardiovascular: There is no cardiomegaly or pericardial effusion. Coronary vascular calcification of the LAD. Moderate calcified and noncalcified plaque of the aortic wall. No aneurysmal dilatation or dissection. The origins of the great vessels of the aortic arch appear patent as visualized. No pulmonary artery embolus identified. Mediastinum/Nodes: There is no hilar or mediastinal adenopathy. The esophagus and the thyroid gland are grossly unremarkable. No mediastinal fluid collection. Lungs/Pleura: There is mild centrilobular emphysema. Bibasilar subpleural linear  atelectasis/scarring. There is no focal consolidation, pleural effusion, or pneumothorax. The central airways are patent. Musculoskeletal: Degenerative changes of the spine. No acute osseous pathology. Review of the MIP images confirms the above findings. CT ABDOMEN and PELVIS FINDINGS No intra-abdominal free air or free fluid. Hepatobiliary: The liver is unremarkable. No intrahepatic biliary ductal dilatation. No calcified gallstone or pericholecystic fluid. Pancreas: Unremarkable. No pancreatic ductal dilatation or surrounding inflammatory changes. Spleen: Splenectomy. Adrenals/Urinary Tract: There is a 5 mm nonobstructing right renal inferior pole calculus. There is a 3 mm nonobstructing left renal inferior pole calculus. There is no hydronephrosis on either side. Multiple bilateral renal hypodense lesions measure up to 15 mm in the upper pole of the right kidney. Larger lesions demonstrate fluid attenuation most consistent with cysts and the smaller lesions are too small to characterize. There is heterogeneous enhancement of the right renal parenchyma with areas of decreased nephrogram consistent with right-sided pyelonephritis. No abscess. Correlation with urinalysis recommended. Mild left pyelonephritis may also be present. The visualized ureters appear unremarkable. The urinary bladder is grossly unremarkable. Stomach/Bowel: There is no bowel obstruction or active inflammation. The appendix is normal. Vascular/Lymphatic: Moderate aortoiliac atherosclerotic disease. The IVC is unremarkable. No portal venous gas. There is no adenopathy. Reproductive: Mildly enlarged prostate gland measuring 5.5 cm in transverse axial diameter. The seminal vesicles are symmetric. Other: None Musculoskeletal: Degenerative changes of the spine. No acute osseous pathology. Review of the MIP images confirms the above findings. IMPRESSION: 1. No acute intrathoracic pathology. No CT evidence of pulmonary artery embolus. 2. Right-sided  pyelonephritis. Correlation with urinalysis recommended. No abscess. 3. Nonobstructing bilateral renal calculi. No hydronephrosis. Aortic Atherosclerosis (ICD10-I70.0) and Emphysema (ICD10-J43.9). Electronically Signed   By: Anner Crete M.D.   On: 02/14/2019 22:32   Dg Chest Port 1 View  Result Date: 02/14/2019 CLINICAL DATA:  Fever and chills. Bladder surgery 2 weeks ago. History of COPD and hypertension. EXAM: PORTABLE CHEST 1 VIEW COMPARISON:  04/07/2017 FINDINGS: Normal sized heart. Tortuous aorta. Clear lungs. The lungs remain hyperexpanded. Stable mild elevation of the left hemidiaphragm. Unremarkable bones. IMPRESSION: No acute abnormality.  Stable changes of COPD. Electronically Signed   By: Claudie Revering M.D.   On: 02/14/2019 21:47   US Abdomen Limited Ruq  Result Date: 02/15/2019 CLINICAL DATA:  Elevated LFTs EXAM: ULTRASOUND ABDOMEN LIMITED RIGHT UPPER QUADRANT COMPARISON:  CT 02/14/2019 FINDINGS: Gallbladder: Ring down artifact from the anterior gallbladder wall compatible with adenomyomatosis. No stones or wall thickening. Common bile duct: Diameter: Normal caliber, 3 mm Liver: Increased echotexture compatible with fatty infiltration. No focal abnormality or biliary ductal dilatation. Portal vein is patent on color Doppler imaging with normal direction of blood flow  towards the liver. Other: None. IMPRESSION: Fatty infiltration of the liver. Adenomyomatosis of the gallbladder wall. No cholelithiasis or acute cholecystitis. Electronically Signed   By: Rolm Baptise M.D.   On: 02/15/2019 00:37     Scheduled Meds: . amLODipine  10 mg Oral Daily  . atorvastatin  20 mg Oral QHS  . enoxaparin (LOVENOX) injection  40 mg Subcutaneous Q24H  . fluticasone furoate-vilanterol  1 puff Inhalation Daily  . tamsulosin  0.4 mg Oral Daily   Continuous Infusions: . cefTRIAXone (ROCEPHIN)  IV 1 g (02/15/19 2029)     LOS: 2 days   Time Spent in minutes   30 minutes  Little Ishikawa D.O. on  02/16/2019 at 7:52 AM  Between 7am to 7pm - Please see pager noted on [email or web address]  After 7pm go to [email or web address]  And look for the night coverage person covering for me after hours  [facility] Office  [phone]

## 2019-02-16 NOTE — Plan of Care (Signed)
  Problem: Urinary Elimination: Goal: Signs and symptoms of infection will decrease Outcome: Progressing   Problem: Education: Goal: Knowledge of General Education information will improve Description: Including pain rating scale, medication(s)/side effects and non-pharmacologic comfort measures Outcome: Progressing   Problem: Health Behavior/Discharge Planning: Goal: Ability to manage health-related needs will improve Outcome: Progressing   Problem: Clinical Measurements: Goal: Ability to maintain clinical measurements within normal limits will improve Outcome: Progressing   Problem: Clinical Measurements: Goal: Respiratory complications will improve Outcome: Progressing   Problem: Clinical Measurements: Goal: Diagnostic test results will improve Outcome: Progressing

## 2019-02-16 NOTE — Progress Notes (Signed)
Initial Nutrition Assessment  DOCUMENTATION CODES:   Not applicable  INTERVENTION:   -Ensure Enlive po BID, each supplement provides 350 kcal and 20 grams of protein -MVI with minerals daily -Magic cup TID with meals, each supplement provides 290 kcal and 9 grams of protein  NUTRITION DIAGNOSIS:   Increased nutrient needs related to acute illness(sepsis) as evidenced by estimated needs.  GOAL:   Patient will meet greater than or equal to 90% of their needs  MONITOR:   PO intake, Supplement acceptance, Weight trends, Skin, Labs, I & O's  REASON FOR ASSESSMENT:   Consult Assessment of nutrition requirement/status  ASSESSMENT:   Connor Strey Sr. is a 73 y.o. male with medical history significant of bladder cancer status post transurethral resection of bladder tumor and intravesical instillation of chemotherapy on 02/02/2019, COPD, hypertension, CKD stage [name], anxiety, chronic back pain presenting to the hospital for evaluation of fever and fatigue.  Patient reports 1 week history of fevers, fatigue, and decreased appetite.  Denies abdominal pain, nausea, vomiting, or diarrhea.  He has been having dysuria and not urinating much.  He is having right-sided flank pain.  Reports having a chronic cough and shortness of breath secondary to COPD.  States earlier today he had a "slight spasm" located in the middle of his chest at rest which lasted for very short time.  He was not having any nausea, diaphoresis, or dyspnea at that time.  Denies any chest pain at present.  Pt admitted with sepsis 2/2 to pyelonephritis.   Reviewed I/O's: -630 ml x 24 hours and -1.6 L since admission  UOP: 1.4 L x 24 hours  Pt sleeping soundly at time of visit and did not respond to touch or voice. Unable to assess lower extremities on physical exam, as pt was wearing thick sweatpants during visit.   Per H&P, pt with decreased appetite over the past week due to illness. Documented meal completion  10-50%.   Reviewed wt hx (obtained current wt via bedscale). Wt has been stable over the past year.   Given decreased oral intake, pt would benefit from addition of oral nutrition supplements.  Labs reviewed: Na: 133.   NUTRITION - FOCUSED PHYSICAL EXAM:    Most Recent Value  Orbital Region  No depletion  Upper Arm Region  Mild depletion  Thoracic and Lumbar Region  No depletion  Buccal Region  No depletion  Temple Region  Mild depletion  Clavicle Bone Region  No depletion  Clavicle and Acromion Bone Region  No depletion  Scapular Bone Region  No depletion  Dorsal Hand  No depletion  Patellar Region  Unable to assess  Anterior Thigh Region  Unable to assess  Posterior Calf Region  Unable to assess  Edema (RD Assessment)  Unable to assess  Hair  Reviewed  Eyes  Reviewed  Skin  Reviewed  Nails  Reviewed       Diet Order:   Diet Order            Diet Heart Room service appropriate? No; Fluid consistency: Thin  Diet effective now              EDUCATION NEEDS:   No education needs have been identified at this time  Skin:  Skin Assessment: Reviewed RN Assessment  Last BM:  02/08/19  Height:   Ht Readings from Last 1 Encounters:  02/16/19 5\' 7"  (1.702 m)    Weight:   Wt Readings from Last 1 Encounters:  02/16/19 75.6  kg    Ideal Body Weight:  67.3 kg  BMI:  Body mass index is 26.1 kg/m.  Estimated Nutritional Needs:   Kcal:  1700-1900  Protein:  90-105 grams  Fluid:  > 1.7 L    Ravinder Lukehart A. Jimmye Norman, RD, LDN, [place] Registered Dietitian II Certified Diabetes Care and Education Specialist Pager: [phone] After hours Pager: [phone]

## 2019-02-17 LAB — CBC
HCT: 34.1 % — ABNORMAL LOW (ref 39.0–52.0)
Hemoglobin: 12 g/dL — ABNORMAL LOW (ref 13.0–17.0)
MCH: 29.9 pg (ref 26.0–34.0)
MCHC: 35.2 g/dL (ref 30.0–36.0)
MCV: 85 fL (ref 80.0–100.0)
Platelets: 628 10*3/uL — ABNORMAL HIGH (ref 150–400)
RBC: 4.01 MIL/uL — ABNORMAL LOW (ref 4.22–5.81)
RDW: 16.5 % — ABNORMAL HIGH (ref 11.5–15.5)
WBC: 13.5 10*3/uL — ABNORMAL HIGH (ref 4.0–10.5)
nRBC: 0.1 % (ref 0.0–0.2)

## 2019-02-17 LAB — BASIC METABOLIC PANEL
Anion gap: 9 (ref 5–15)
BUN: 13 mg/dL (ref 8–23)
CO2: 22 mmol/L (ref 22–32)
Calcium: 8.8 mg/dL — ABNORMAL LOW (ref 8.9–10.3)
Chloride: 104 mmol/L (ref 98–111)
Creatinine, Ser: 1.02 mg/dL (ref 0.61–1.24)
GFR calc Af Amer: >60 mL/min (ref >60)
GFR calc non Af Amer: >60 mL/min (ref >60)
Glucose, Bld: 106 mg/dL — ABNORMAL HIGH (ref 70–99)
Potassium: 3.2 mmol/L — ABNORMAL LOW (ref 3.5–5.1)
Sodium: 135 mmol/L (ref 135–145)

## 2019-02-17 MED ORDER — POTASSIUM CHLORIDE 20 MEQ PO PACK
40.0000 meq | PACK | Freq: Two times a day (BID) | ORAL | Status: DC
  Administered 2019-02-17 – 2019-02-18 (×3): 40 meq via ORAL
  Filled 2019-02-17 (×3): qty 2

## 2019-02-17 NOTE — Progress Notes (Signed)
PROGRESS NOTE    [name] Sr.  [id number] DOB: 07-04-1945 DOA: 02/14/2019 PCP: Dorothyann Peng, NP   Brief Narrative:  HPI On 02/14/2019 by Dr. Shela Leff Connor Pontiff Sr. is a 73 y.o. male with medical history significant of bladder cancer status post transurethral resection of bladder tumor and intravesical instillation of chemotherapy on 02/02/2019, COPD, hypertension, CKD stage [name], anxiety, chronic back pain presenting to the hospital for evaluation of fever and fatigue.  Patient reports 1 week history of fevers, fatigue, and decreased appetite.  Denies abdominal pain, nausea, vomiting, or diarrhea.  He has been having dysuria and not urinating much.  He is having right-sided flank pain.  Reports having a chronic cough and shortness of breath secondary to COPD. He was not having any nausea, diaphoresis, or dyspnea at that time. Admitted for sepsis secondary to pyelonephritis. Currently on IVF and IV antibiotics.     Assessment & Plan  Principal Problem:   Pyelonephritis Active Problems:   Sepsis ([place])   AKI (acute kidney injury) ([place])   Hyponatremia   Metabolic acidosis    Sepsis secondary to pyelonephritis, POA, improving -Patient presented with fever, tachycardia, tachypnea, leukocytosis -UA: rare bacteria, moderate leukocytes, 6-10WBC  -CT showing right-sided pyelonephritis, with no abscess -Patient received vancomycin, cefepime, Flagyl in the emergency department for sepsis secondary to an unknown source.  However antibiotic coverage has been weaned to ceftriaxone -Blood culture and urine cultures currently pending -remain negative -WBC markedly improved currently 13 (previously 24)  AKI on Chronic kidney disease, stage [name] resolving -Baseline creatinine approximately 1.3 -Creatinine on admission, 1.5, down to 1.0 today after increasing PO intake  Mild hyponatremia/hypokalemia, improving -Likely secondary to poor oral intake as patient states he has  not been able to eat or drink anything for 6 days prior to admission -Sodium up to 135,WNL -K minimally low at 3.2 - replete appropriately -Remains off IVF   Metabolic acidosis -with normal anion gap -likely related to above  Elevated LFTs -resolved  Atypical chest discomfort, resolved -occurred prior to admission -felt to be a muscle spasm -High-sensitivity troponin 31, 34 - flat- likely demand ischemia due to sepsis -EKG not suggestive of ACS -CTA chest negative for PE  COPD, not in exacerbation -Stable, currently in no wheezing -Continue home medications  Thrombocytosis  Likely acute phase-reactant Follow morning labs  Essential hypertension -continue amlodipine today  DVT Prophylaxis  lovenox Code Status: DNR Family Communication: Daughter at bedside Disposition Plan: Admitted as inpatient. Pending culture results and improvement in symptoms -disposition pending further clinical improvement and further discussion with family, requesting discharge home this point given clinical improvement  Consultants None  Procedures  none  Antibiotics   Anti-infectives (From admission, onward)   Start     Dose/Rate Route Frequency Ordered Stop   02/15/19 2100  vancomycin (VANCOCIN) IVPB 1000 mg/200 mL premix  Status:  Discontinued     1,000 mg 200 mL/hr over 60 Minutes Intravenous Every 24 hours 02/14/19 2112 02/14/19 2323   02/15/19 2100  cefTRIAXone (ROCEPHIN) 1 g in sodium chloride 0.9 % 100 mL IVPB     1 g 200 mL/hr over 30 Minutes Intravenous Every 24 hours 02/14/19 2323     02/15/19 1000  ceFEPIme (MAXIPIME) 2 g in sodium chloride 0.9 % 100 mL IVPB  Status:  Discontinued     2 g 200 mL/hr over 30 Minutes Intravenous Every 12 hours 02/14/19 2112 02/14/19 2323   02/14/19 2115  ceFEPIme (MAXIPIME) 2 g in  sodium chloride 0.9 % 100 mL IVPB     2 g 200 mL/hr over 30 Minutes Intravenous  Once 02/14/19 2102 02/14/19 2223   02/14/19 2115  metroNIDAZOLE (FLAGYL) IVPB 500 mg      500 mg 100 mL/hr over 60 Minutes Intravenous  Once 02/14/19 2102 02/14/19 2325   02/14/19 2115  vancomycin (VANCOCIN) IVPB 1000 mg/200 mL premix     1,000 mg 200 mL/hr over 60 Minutes Intravenous  Once 02/14/19 2102 02/14/19 2325      Subjective:   No acute issues or events overnight, denies chest pain, shortness of breath, nausea, vomiting, diarrhea, constipation, headache, fevers, chills.  Objective:   Vitals:   02/16/19 1512 02/16/19 2035 02/16/19 2035 02/17/19 0603  BP:  (!) 157/84 (!) 157/84 (!) 167/72  Pulse:  86 86 75  Resp:  20 20 18   Temp:  98.1 F (36.7 C) 98.1 F (36.7 C) 97.7 F (36.5 C)  TempSrc:  Oral Oral Oral  SpO2:  96% 96% 93%  Weight: 75.6 kg     Height: 5\' 7"  (1.702 m)       Intake/Output Summary (Last 24 hours) at 02/17/2019 0746 Last data filed at 02/17/2019 0604 Gross per 24 hour  Intake 820 ml  Output -  Net 820 ml   Filed Weights   02/16/19 1512  Weight: 75.6 kg    Exam  General: Well developed, well nourished, NAD, appears stated age  HEENT: NCAT, mucous membranes moist.   Neck: Supple  Cardiovascular: S1 S2 auscultated, no rubs, murmurs or gallops. Regular rate and rhythm.  Respiratory: Clear to auscultation bilaterally  Abdomen: Soft, nontender, nondistended, + bowel sounds  Extremities: warm dry without cyanosis clubbing or edema  Neuro: AAOx3, nonfocal  Psych: Appropriate mood and affect   Data Reviewed: I have personally reviewed following labs and imaging studies  CBC: Recent Labs  Lab 02/14/19 1951 02/15/19 0509 02/17/19 0243  WBC 23.1* 24.2* 13.5*  NEUTROABS 18.7*  --   --   HGB 13.6 12.3* 12.0*  HCT 39.3 36.3* 34.1*  MCV 87.5 86.8 85.0  PLT 354 344 [id number]*   Basic Metabolic Panel: Recent Labs  Lab 02/14/19 1951 02/15/19 0509 02/17/19 0243  NA 130* 133* 135  K 3.6 4.1 3.2*  CL 99 103 104  CO2 16* 19* 22  GLUCOSE 119* 113* 106*  BUN 14 14 13   CREATININE 1.50* 1.48* 1.02  CALCIUM 8.6* 7.9* 8.8*    GFR: Estimated Creatinine Clearance: 59.4 mL/min (by C-G formula based on SCr of 1.02 mg/dL).  Liver Function Tests: Recent Labs  Lab 02/14/19 1951 02/15/19 0509  AST 74* 39  ALT 54* 40  ALKPHOS 146* 124  BILITOT 1.2 1.0  PROT 6.7 6.2*  ALBUMIN 3.2* 2.6*   Coagulation Profile: Recent Labs  Lab 02/14/19 2106  INR 1.2   Urine analysis:    Component Value Date/Time   COLORURINE YELLOW 02/14/2019 2149   APPEARANCEUR CLEAR 02/14/2019 2149   LABSPEC 1.013 02/14/2019 2149   PHURINE 6.0 02/14/2019 2149   GLUCOSEU NEGATIVE 02/14/2019 2149   HGBUR SMALL (A) 02/14/2019 2149   BILIRUBINUR NEGATIVE 02/14/2019 2149   KETONESUR 20 (A) 02/14/2019 2149   PROTEINUR 100 (A) 02/14/2019 2149   UROBILINOGEN 0.2 11/07/2013 0211   NITRITE NEGATIVE 02/14/2019 2149   LEUKOCYTESUR MODERATE (A) 02/14/2019 2149   Recent Results (from the past 240 hour(s))  Blood Culture (routine x 2)     Status: None (Preliminary result)   Collection Time: 02/14/19  9:06 PM   Specimen: BLOOD LEFT WRIST  Result Value Ref Range Status   Specimen Description BLOOD LEFT WRIST  Final   Special Requests   Final    BOTTLES DRAWN AEROBIC AND ANAEROBIC Blood Culture adequate volume   Culture   Final    NO GROWTH 2 DAYS Performed at [facility], [address], [place], [place] [address]    Report Status PENDING  Incomplete  SARS Coronavirus 2 [facility] order, Performed in [facility] hospital lab) Nasopharyngeal Nasopharyngeal Swab     Status: None   Collection Time: 02/14/19  9:37 PM   Specimen: Nasopharyngeal Swab  Result Value Ref Range Status   SARS Coronavirus 2 NEGATIVE NEGATIVE Final    Comment: (NOTE) If result is NEGATIVE SARS-CoV-2 target nucleic acids are NOT DETECTED. The SARS-CoV-2 RNA is generally detectable in upper and lower  respiratory specimens during the acute phase of infection. The lowest  concentration of SARS-CoV-2 viral copies this assay can detect is 250  copies / mL. A  negative result does not preclude SARS-CoV-2 infection  and should not be used as the sole basis for treatment or other  patient management decisions.  A negative result may occur with  improper specimen collection / handling, submission of specimen other  than nasopharyngeal swab, presence of viral mutation(s) within the  areas targeted by this assay, and inadequate number of viral copies  (<250 copies / mL). A negative result must be combined with clinical  observations, patient history, and epidemiological information. If result is POSITIVE SARS-CoV-2 target nucleic acids are DETECTED. The SARS-CoV-2 RNA is generally detectable in upper and lower  respiratory specimens dur ing the acute phase of infection.  Positive  results are indicative of active infection with SARS-CoV-2.  Clinical  correlation with patient history and other diagnostic information is  necessary to determine patient infection status.  Positive results do  not rule out bacterial infection or co-infection with other viruses. If result is PRESUMPTIVE POSTIVE SARS-CoV-2 nucleic acids MAY BE PRESENT.   A presumptive positive result was obtained on the submitted specimen  and confirmed on repeat testing.  While 2019 novel coronavirus  (SARS-CoV-2) nucleic acids may be present in the submitted sample  additional confirmatory testing may be necessary for epidemiological  and / or clinical management purposes  to differentiate between  SARS-CoV-2 and other Sarbecovirus currently known to infect humans.  If clinically indicated additional testing with an alternate test  methodology [phone]) is advised. The SARS-CoV-2 RNA is generally  detectable in upper and lower respiratory sp ecimens during the acute  phase of infection. The expected result is Negative. Fact Sheet for Patients:  StrictlyIdeas.no Fact Sheet for Healthcare Providers: BankingDealers.co.za This test is not  yet approved or cleared by the [place] FDA and has been authorized for detection and/or diagnosis of SARS-CoV-2 by FDA under an Emergency Use Authorization (EUA).  This EUA will remain in effect (meaning this test can be used) for the duration of the COVID-19 declaration under Section 564(b)(1) of the Act, 21 U.S.C. section 360bbb-3(b)(1), unless the authorization is terminated or revoked sooner. Performed at [facility], [place] [address], [place],  [address]   Blood Culture (routine x 2)     Status: None (Preliminary result)   Collection Time: 02/14/19  9:48 PM   Specimen: BLOOD RIGHT ARM  Result Value Ref Range Status   Specimen Description BLOOD RIGHT ARM  Final   Special Requests   Final  BOTTLES DRAWN AEROBIC AND ANAEROBIC Blood Culture adequate volume   Culture   Final    NO GROWTH 2 DAYS Performed at [facility], [place] [address], [place], [place] [address]    Report Status PENDING  Incomplete  Urine culture     Status: None   Collection Time: 02/14/19  9:49 PM   Specimen: In/Out Cath Urine  Result Value Ref Range Status   Specimen Description IN/OUT CATH URINE  Final   Special Requests NONE  Final   Culture   Final    NO GROWTH Performed at [facility], [place] [address], [place], [place] [address]    Report Status 02/15/2019 FINAL  Final      Radiology Studies: No results found.   Scheduled Meds: . amLODipine  10 mg Oral Daily  . atorvastatin  20 mg Oral QHS  . enoxaparin (LOVENOX) injection  40 mg Subcutaneous Q24H  . feeding supplement (ENSURE ENLIVE)  237 mL Oral BID BM  . fluticasone furoate-vilanterol  1 puff Inhalation Daily  . influenza vac split quadrivalent PF  0.5 mL Intramuscular Tomorrow-1000  . multivitamin with minerals  1 tablet Oral Daily  . pneumococcal 23 valent vaccine  0.5 mL Intramuscular Once  . tamsulosin  0.4 mg Oral Daily   Continuous Infusions: . cefTRIAXone (ROCEPHIN)  IV 1 g (02/16/19 2039)      LOS: 3 days   Time Spent in minutes   30 minutes  Little Ishikawa D.O. on 02/17/2019 at 7:46 AM  Between 7am to 7pm - Please see pager noted on [email or web address]  After 7pm go to [email or web address]  And look for the night coverage person covering for me after hours  [facility] Office  [phone]

## 2019-02-17 NOTE — Plan of Care (Signed)
  Problem: Pain Managment: Goal: General experience of comfort will improve Outcome: Progressing   Problem: Safety: Goal: Ability to remain free from injury will improve Outcome: Progressing   Problem: Skin Integrity: Goal: Risk for impaired skin integrity will decrease Outcome: Progressing   

## 2019-02-18 ENCOUNTER — Encounter (HOSPITAL_COMMUNITY): Payer: Self-pay | Admitting: General Practice

## 2019-02-18 LAB — CBC
HCT: 37.3 % — ABNORMAL LOW (ref 39.0–52.0)
Hemoglobin: 12.7 g/dL — ABNORMAL LOW (ref 13.0–17.0)
MCH: 29.5 pg (ref 26.0–34.0)
MCHC: 34 g/dL (ref 30.0–36.0)
MCV: 86.5 fL (ref 80.0–100.0)
Platelets: 868 10*3/uL — ABNORMAL HIGH (ref 150–400)
RBC: 4.31 MIL/uL (ref 4.22–5.81)
RDW: 16.4 % — ABNORMAL HIGH (ref 11.5–15.5)
WBC: 13.3 10*3/uL — ABNORMAL HIGH (ref 4.0–10.5)
nRBC: 0.2 % (ref 0.0–0.2)

## 2019-02-18 LAB — BASIC METABOLIC PANEL
Anion gap: 9 (ref 5–15)
BUN: 11 mg/dL (ref 8–23)
CO2: 21 mmol/L — ABNORMAL LOW (ref 22–32)
Calcium: 8.9 mg/dL (ref 8.9–10.3)
Chloride: 107 mmol/L (ref 98–111)
Creatinine, Ser: 1.07 mg/dL (ref 0.61–1.24)
GFR calc Af Amer: >60 mL/min (ref >60)
GFR calc non Af Amer: >60 mL/min (ref >60)
Glucose, Bld: 104 mg/dL — ABNORMAL HIGH (ref 70–99)
Potassium: 3.9 mmol/L (ref 3.5–5.1)
Sodium: 137 mmol/L (ref 135–145)

## 2019-02-18 MED ORDER — CEPHALEXIN 500 MG PO CAPS: 500.0000 mg | ORAL_CAPSULE | Freq: Two times a day (BID) | ORAL | 0 refills | Status: AC

## 2019-02-18 NOTE — Discharge Summary (Signed)
Physician Discharge Summary  Connor Forgette Hav[place] Sr. [id number] DOB: 1945-11-22 DOA: 02/14/2019  PCP: Dorothyann Peng, NP  Admit date: 02/14/2019 Discharge date: 02/18/2019  Admitted From: Home Disposition: Home  Recommendations for Outpatient Follow-up:  1. Follow up with PCP in 1-2 weeks 2. Please obtain BMP/CBC in one week  Discharge Condition: Stable CODE STATUS: DNR Diet recommendation: As tolerated  Brief/Interim Summary: Connor Ramirezis a 72 y.o.malewith medical history significant ofbladder cancer status post transurethral resection of bladder tumor and intravesical instillation of chemotherapy on 02/02/2019, COPD, hypertension, CKD stage [name], anxiety, chronic back pain presenting to the hospital for evaluation of fever and fatigue.Patient reports 1 week history of fevers, fatigue, and decreased appetite. Denies abdominal pain, nausea, vomiting, or diarrhea. He has been having dysuria and not urinating much. He is having right-sided flank pain. Reports having achronic cough and shortness of breath secondary to COPD.He was not having any nausea, diaphoresis, or dyspnea at that time. Admitted for sepsis secondary to pyelonephritis.  Patient admitted as above with general malaise fatigue weakness noted to have UTI and questionable pyelonephritis.  Patient improved drastically on IV antibiotics with cephalosporins, unfortunately patient's cultures remained negative, without sensitivities we will keep patient on cephalosporin and follow cultures for any resistance although patient declines any history of antibiotic resistance in the past.  Family bedside indicates patient appears quite well, ambulating without difficulty, tolerating p.o. otherwise feels quite well, remains afebrile without leukocytosis since admission.  At this point will discharge patient home on additional 7 days of Keflex with close follow-up with PCP.  Patient was instructed at length at bedside should he  begin to feel fatigued, weak have fevers chills or urinary symptoms he should immediately report back to his PCP or the emergency department for further evaluation and treatment.  Discharge Diagnoses:  Principal Problem:   Pyelonephritis Active Problems:   Sepsis ([place])   AKI (acute kidney injury) ([place])   Hyponatremia   Metabolic acidosis  Discharge Instructions  Discharge Instructions    Diet - low sodium heart healthy   Complete by: As directed    Increase activity slowly   Complete by: As directed      Allergies as of 02/18/2019   No Known Allergies     Medication List    STOP taking these medications   citalopram 20 MG tablet Commonly known as: CELEXA   predniSONE 20 MG tablet Commonly known as: DELTASONE     TAKE these medications   albuterol 108 (90 Base) MCG/ACT inhaler Commonly known as: VENTOLIN HFA Inhale 2 puffs into the lungs every 6 (six) hours as needed for wheezing or shortness of breath.   albuterol (2.5 MG/3ML) 0.083% nebulizer solution Commonly known as: PROVENTIL Take 3 mLs (2.5 mg total) by nebulization every 6 (six) hours as needed for wheezing or shortness of breath.   amLODipine 10 MG tablet Commonly known as: NORVASC TAKE 1 TABLET(10 MG) BY MOUTH DAILY What changed: See the new instructions.   atorvastatin 20 MG tablet Commonly known as: LIPITOR TAKE 1 TABLET BY MOUTH BEFORE BEDTIME TO REDUCE CHOLESTEROL What changed:   how much to take  how to take this  when to take this  additional instructions   cephALEXin 500 MG capsule Commonly known as: KEFLEX Take 1 capsule (500 mg total) by mouth 2 (two) times daily for 7 days.   fluticasone furoate-vilanterol 100-25 MCG/INH Aepb Commonly known as: Breo Ellipta Inhale 1 puff into the lungs daily. What changed: Another medication with  the same name was changed. Make sure you understand how and when to take each.   Breo Ellipta 100-25 MCG/INH Aepb Generic drug: fluticasone  furoate-vilanterol INHALE 1 PUFF INTO THE LUNGS DAILY What changed: See the new instructions.   gabapentin 300 MG capsule Commonly known as: NEURONTIN TAKE 1 CAPSULE(300 MG) BY MOUTH TWICE DAILY What changed:   how much to take  how to take this  when to take this  reasons to take this  additional instructions   ipratropium-albuterol 0.5-2.5 (3) MG/3ML Soln Commonly known as: DUONEB Take 3 mLs by nebulization every 6 (six) hours as needed. For shortness of breath   ketoconazole 2 % cream Commonly known as: NIZORAL Apply 1 application topically daily.   losartan 100 MG tablet Commonly known as: COZAAR TAKE 1 TABLET BY MOUTH DAILY   tamsulosin 0.4 MG Caps capsule Commonly known as: FLOMAX Take 0.4 mg by mouth daily.       No Known Allergies  Procedures/Studies: Ct Angio Chest Pe W/cm &/or Wo Cm  Result Date: 02/14/2019 CLINICAL DATA:  73 year old male with fever, chills, loss of appetite. EXAM: CT ANGIOGRAPHY CHEST CT ABDOMEN AND PELVIS WITH CONTRAST TECHNIQUE: Multidetector CT imaging of the chest was performed using the standard protocol during bolus administration of intravenous contrast. Multiplanar CT image reconstructions and MIPs were obtained to evaluate the vascular anatomy. Multidetector CT imaging of the abdomen and pelvis was performed using the standard protocol during bolus administration of intravenous contrast. CONTRAST:  54mL OMNIPAQUE IOHEXOL 350 MG/ML SOLN COMPARISON:  Chest radiograph dated 02/14/2019. FINDINGS: CTA CHEST FINDINGS Cardiovascular: There is no cardiomegaly or pericardial effusion. Coronary vascular calcification of the LAD. Moderate calcified and noncalcified plaque of the aortic wall. No aneurysmal dilatation or dissection. The origins of the great vessels of the aortic arch appear patent as visualized. No pulmonary artery embolus identified. Mediastinum/Nodes: There is no hilar or mediastinal adenopathy. The esophagus and the thyroid gland  are grossly unremarkable. No mediastinal fluid collection. Lungs/Pleura: There is mild centrilobular emphysema. Bibasilar subpleural linear atelectasis/scarring. There is no focal consolidation, pleural effusion, or pneumothorax. The central airways are patent. Musculoskeletal: Degenerative changes of the spine. No acute osseous pathology. Review of the MIP images confirms the above findings. CT ABDOMEN and PELVIS FINDINGS No intra-abdominal free air or free fluid. Hepatobiliary: The liver is unremarkable. No intrahepatic biliary ductal dilatation. No calcified gallstone or pericholecystic fluid. Pancreas: Unremarkable. No pancreatic ductal dilatation or surrounding inflammatory changes. Spleen: Splenectomy. Adrenals/Urinary Tract: There is a 5 mm nonobstructing right renal inferior pole calculus. There is a 3 mm nonobstructing left renal inferior pole calculus. There is no hydronephrosis on either side. Multiple bilateral renal hypodense lesions measure up to 15 mm in the upper pole of the right kidney. Larger lesions demonstrate fluid attenuation most consistent with cysts and the smaller lesions are too small to characterize. There is heterogeneous enhancement of the right renal parenchyma with areas of decreased nephrogram consistent with right-sided pyelonephritis. No abscess. Correlation with urinalysis recommended. Mild left pyelonephritis may also be present. The visualized ureters appear unremarkable. The urinary bladder is grossly unremarkable. Stomach/Bowel: There is no bowel obstruction or active inflammation. The appendix is normal. Vascular/Lymphatic: Moderate aortoiliac atherosclerotic disease. The IVC is unremarkable. No portal venous gas. There is no adenopathy. Reproductive: Mildly enlarged prostate gland measuring 5.5 cm in transverse axial diameter. The seminal vesicles are symmetric. Other: None Musculoskeletal: Degenerative changes of the spine. No acute osseous pathology. Review of the MIP  images confirms the  above findings. IMPRESSION: 1. No acute intrathoracic pathology. No CT evidence of pulmonary artery embolus. 2. Right-sided pyelonephritis. Correlation with urinalysis recommended. No abscess. 3. Nonobstructing bilateral renal calculi. No hydronephrosis. Aortic Atherosclerosis (ICD10-I70.0) and Emphysema (ICD10-J43.9). Electronically Signed   By: Anner Crete M.D.   On: 02/14/2019 22:32   Ct Abdomen Pelvis W Contrast  Result Date: 02/14/2019 CLINICAL DATA:  73 year old male with fever, chills, loss of appetite. EXAM: CT ANGIOGRAPHY CHEST CT ABDOMEN AND PELVIS WITH CONTRAST TECHNIQUE: Multidetector CT imaging of the chest was performed using the standard protocol during bolus administration of intravenous contrast. Multiplanar CT image reconstructions and MIPs were obtained to evaluate the vascular anatomy. Multidetector CT imaging of the abdomen and pelvis was performed using the standard protocol during bolus administration of intravenous contrast. CONTRAST:  21mL OMNIPAQUE IOHEXOL 350 MG/ML SOLN COMPARISON:  Chest radiograph dated 02/14/2019. FINDINGS: CTA CHEST FINDINGS Cardiovascular: There is no cardiomegaly or pericardial effusion. Coronary vascular calcification of the LAD. Moderate calcified and noncalcified plaque of the aortic wall. No aneurysmal dilatation or dissection. The origins of the great vessels of the aortic arch appear patent as visualized. No pulmonary artery embolus identified. Mediastinum/Nodes: There is no hilar or mediastinal adenopathy. The esophagus and the thyroid gland are grossly unremarkable. No mediastinal fluid collection. Lungs/Pleura: There is mild centrilobular emphysema. Bibasilar subpleural linear atelectasis/scarring. There is no focal consolidation, pleural effusion, or pneumothorax. The central airways are patent. Musculoskeletal: Degenerative changes of the spine. No acute osseous pathology. Review of the MIP images confirms the above findings. CT  ABDOMEN and PELVIS FINDINGS No intra-abdominal free air or free fluid. Hepatobiliary: The liver is unremarkable. No intrahepatic biliary ductal dilatation. No calcified gallstone or pericholecystic fluid. Pancreas: Unremarkable. No pancreatic ductal dilatation or surrounding inflammatory changes. Spleen: Splenectomy. Adrenals/Urinary Tract: There is a 5 mm nonobstructing right renal inferior pole calculus. There is a 3 mm nonobstructing left renal inferior pole calculus. There is no hydronephrosis on either side. Multiple bilateral renal hypodense lesions measure up to 15 mm in the upper pole of the right kidney. Larger lesions demonstrate fluid attenuation most consistent with cysts and the smaller lesions are too small to characterize. There is heterogeneous enhancement of the right renal parenchyma with areas of decreased nephrogram consistent with right-sided pyelonephritis. No abscess. Correlation with urinalysis recommended. Mild left pyelonephritis may also be present. The visualized ureters appear unremarkable. The urinary bladder is grossly unremarkable. Stomach/Bowel: There is no bowel obstruction or active inflammation. The appendix is normal. Vascular/Lymphatic: Moderate aortoiliac atherosclerotic disease. The IVC is unremarkable. No portal venous gas. There is no adenopathy. Reproductive: Mildly enlarged prostate gland measuring 5.5 cm in transverse axial diameter. The seminal vesicles are symmetric. Other: None Musculoskeletal: Degenerative changes of the spine. No acute osseous pathology. Review of the MIP images confirms the above findings. IMPRESSION: 1. No acute intrathoracic pathology. No CT evidence of pulmonary artery embolus. 2. Right-sided pyelonephritis. Correlation with urinalysis recommended. No abscess. 3. Nonobstructing bilateral renal calculi. No hydronephrosis. Aortic Atherosclerosis (ICD10-I70.0) and Emphysema (ICD10-J43.9). Electronically Signed   By: Anner Crete M.D.   On:  02/14/2019 22:32   Dg Chest Port 1 View  Result Date: 02/14/2019 CLINICAL DATA:  Fever and chills. Bladder surgery 2 weeks ago. History of COPD and hypertension. EXAM: PORTABLE CHEST 1 VIEW COMPARISON:  04/07/2017 FINDINGS: Normal sized heart. Tortuous aorta. Clear lungs. The lungs remain hyperexpanded. Stable mild elevation of the left hemidiaphragm. Unremarkable bones. IMPRESSION: No acute abnormality.  Stable changes of COPD. Electronically Signed  By: Claudie Revering M.D.   On: 02/14/2019 21:47   US Abdomen Limited Ruq  Result Date: 02/15/2019 CLINICAL DATA:  Elevated LFTs EXAM: ULTRASOUND ABDOMEN LIMITED RIGHT UPPER QUADRANT COMPARISON:  CT 02/14/2019 FINDINGS: Gallbladder: Ring down artifact from the anterior gallbladder wall compatible with adenomyomatosis. No stones or wall thickening. Common bile duct: Diameter: Normal caliber, 3 mm Liver: Increased echotexture compatible with fatty infiltration. No focal abnormality or biliary ductal dilatation. Portal vein is patent on color Doppler imaging with normal direction of blood flow towards the liver. Other: None. IMPRESSION: Fatty infiltration of the liver. Adenomyomatosis of the gallbladder wall. No cholelithiasis or acute cholecystitis. Electronically Signed   By: Rolm Baptise M.D.   On: 02/15/2019 00:37     Subjective: No acute issues or events overnight, denies chest pain, shortness of breath, nausea, vomiting, diarrhea, constipation, headache, fevers, chills.  Discharge Exam: Vitals:   02/17/19 2209 02/18/19 0519  BP: 138/81 114/88  Pulse: 86 91  Resp: 18 18  Temp: 98.1 F (36.7 C) 98.1 F (36.7 C)  SpO2: 94% 94%   Vitals:   02/17/19 0836 02/17/19 1425 02/17/19 2209 02/18/19 0519  BP:  (!) 146/78 138/81 114/88  Pulse:  84 86 91  Resp:  18 18 18   Temp:  98.4 F (36.9 C) 98.1 F (36.7 C) 98.1 F (36.7 C)  TempSrc:  Oral Oral Oral  SpO2: 96% 95% 94% 94%  Weight:      Height:        General:  Pleasantly resting in bed, No  acute distress. HEENT:  Normocephalic atraumatic.  Sclerae nonicteric, noninjected.  Extraocular movements intact bilaterally. Neck:  Without mass or deformity.  Trachea is midline. Lungs:  Clear to auscultate bilaterally without rhonchi, wheeze, or rales. Heart:  Regular rate and rhythm.  Without murmurs, rubs, or gallops. Abdomen:  Soft, nontender, nondistended.  Without guarding or rebound. Extremities: Without cyanosis, clubbing, edema, or obvious deformity. Vascular:  Dorsalis pedis and posterior tibial pulses palpable bilaterally. Skin:  Warm and dry, no erythema, no ulcerations.   The results of significant diagnostics from this hospitalization (including imaging, microbiology, ancillary and laboratory) are listed below for reference.     Microbiology: Recent Results (from the past 240 hour(s))  Blood Culture (routine x 2)     Status: None (Preliminary result)   Collection Time: 02/14/19  9:06 PM   Specimen: BLOOD LEFT WRIST  Result Value Ref Range Status   Specimen Description BLOOD LEFT WRIST  Final   Special Requests   Final    BOTTLES DRAWN AEROBIC AND ANAEROBIC Blood Culture adequate volume   Culture   Final    NO GROWTH 2 DAYS Performed at [facility], [address] [place], [place] [address]    Report Status PENDING  Incomplete  SARS Coronavirus 2 [facility] order, Performed in [facility] hospital lab) Nasopharyngeal Nasopharyngeal Swab     Status: None   Collection Time: 02/14/19  9:37 PM   Specimen: Nasopharyngeal Swab  Result Value Ref Range Status   SARS Coronavirus 2 NEGATIVE NEGATIVE Final    Comment: (NOTE) If result is NEGATIVE SARS-CoV-2 target nucleic acids are NOT DETECTED. The SARS-CoV-2 RNA is generally detectable in upper and lower  respiratory specimens during the acute phase of infection. The lowest  concentration of SARS-CoV-2 viral copies this assay can detect is 250  copies / mL. A negative result does not preclude SARS-CoV-2 infection   and should not be used as the sole  basis for treatment or other  patient management decisions.  A negative result may occur with  improper specimen collection / handling, submission of specimen other  than nasopharyngeal swab, presence of viral mutation(s) within the  areas targeted by this assay, and inadequate number of viral copies  (<250 copies / mL). A negative result must be combined with clinical  observations, patient history, and epidemiological information. If result is POSITIVE SARS-CoV-2 target nucleic acids are DETECTED. The SARS-CoV-2 RNA is generally detectable in upper and lower  respiratory specimens dur ing the acute phase of infection.  Positive  results are indicative of active infection with SARS-CoV-2.  Clinical  correlation with patient history and other diagnostic information is  necessary to determine patient infection status.  Positive results do  not rule out bacterial infection or co-infection with other viruses. If result is PRESUMPTIVE POSTIVE SARS-CoV-2 nucleic acids MAY BE PRESENT.   A presumptive positive result was obtained on the submitted specimen  and confirmed on repeat testing.  While 2019 novel coronavirus  (SARS-CoV-2) nucleic acids may be present in the submitted sample  additional confirmatory testing may be necessary for epidemiological  and / or clinical management purposes  to differentiate between  SARS-CoV-2 and other Sarbecovirus currently known to infect humans.  If clinically indicated additional testing with an alternate test  methodology [phone]) is advised. The SARS-CoV-2 RNA is generally  detectable in upper and lower respiratory sp ecimens during the acute  phase of infection. The expected result is Negative. Fact Sheet for Patients:  StrictlyIdeas.no Fact Sheet for Healthcare Providers: BankingDealers.co.za This test is not yet approved or cleared by the [place] FDA  and has been authorized for detection and/or diagnosis of SARS-CoV-2 by FDA under an Emergency Use Authorization (EUA).  This EUA will remain in effect (meaning this test can be used) for the duration of the COVID-19 declaration under Section 564(b)(1) of the Act, 21 U.S.C. section 360bbb-3(b)(1), unless the authorization is terminated or revoked sooner. Performed at [facility], [place] [address] [place], [place] [address]   Blood Culture (routine x 2)     Status: None (Preliminary result)   Collection Time: 02/14/19  9:48 PM   Specimen: BLOOD RIGHT ARM  Result Value Ref Range Status   Specimen Description BLOOD RIGHT ARM  Final   Special Requests   Final    BOTTLES DRAWN AEROBIC AND ANAEROBIC Blood Culture adequate volume   Culture   Final    NO GROWTH 2 DAYS Performed at [facility], [place] [address], [place], [place] [address]    Report Status PENDING  Incomplete  Urine culture     Status: None   Collection Time: 02/14/19  9:49 PM   Specimen: In/Out Cath Urine  Result Value Ref Range Status   Specimen Description IN/OUT CATH URINE  Final   Special Requests NONE  Final   Culture   Final    NO GROWTH Performed at [facility], [place] [address], [place], [place] [address]    Report Status 02/15/2019 FINAL  Final     Labs:  Basic Metabolic Panel: Recent Labs  Lab 02/14/19 1951 02/15/19 0509 02/17/19 0243 02/18/19 0423  NA 130* 133* 135 137  K 3.6 4.1 3.2* 3.9  CL 99 103 104 107  CO2 16* 19* 22 21*  GLUCOSE 119* 113* 106* 104*  BUN 14 14 13 11   CREATININE 1.50* 1.48* 1.02 1.07  CALCIUM 8.6* 7.9* 8.8* 8.9  Liver Function Tests: Recent Labs  Lab 02/14/19 1951 02/15/19 0509  AST 74* 39  ALT 54* 40  ALKPHOS 146* 124  BILITOT 1.2 1.0  PROT 6.7 6.2*  ALBUMIN 3.2* 2.6*   No results for input(s): LIPASE, AMYLASE in the last 168 hours. No results for input(s): AMMONIA in the last 168 hours. CBC: Recent Labs  Lab 02/14/19 1951 02/15/19 0509  02/17/19 0243 02/18/19 0423  WBC 23.1* 24.2* 13.5* 13.3*  NEUTROABS 18.7*  --   --   --   HGB 13.6 12.3* 12.0* 12.7*  HCT 39.3 36.3* 34.1* 37.3*  MCV 87.5 86.8 85.0 86.5  PLT 354 344 628* 868*    Urinalysis    Component Value Date/Time   COLORURINE YELLOW 02/14/2019 2149   APPEARANCEUR CLEAR 02/14/2019 2149   LABSPEC 1.013 02/14/2019 2149   PHURINE 6.0 02/14/2019 2149   GLUCOSEU NEGATIVE 02/14/2019 2149   HGBUR SMALL (A) 02/14/2019 2149   BILIRUBINUR NEGATIVE 02/14/2019 2149   KETONESUR 20 (A) 02/14/2019 2149   PROTEINUR 100 (A) 02/14/2019 2149   UROBILINOGEN 0.2 11/07/2013 0211   NITRITE NEGATIVE 02/14/2019 2149   LEUKOCYTESUR MODERATE (A) 02/14/2019 2149   Sepsis Labs Invalid input(s): PROCALCITONIN,  WBC,  LACTICIDVEN Microbiology Recent Results (from the past 240 hour(s))  Blood Culture (routine x 2)     Status: None (Preliminary result)   Collection Time: 02/14/19  9:06 PM   Specimen: BLOOD LEFT WRIST  Result Value Ref Range Status   Specimen Description BLOOD LEFT WRIST  Final   Special Requests   Final    BOTTLES DRAWN AEROBIC AND ANAEROBIC Blood Culture adequate volume   Culture   Final    NO GROWTH 2 DAYS Performed at [facility], [address], [place], [place] [address]    Report Status PENDING  Incomplete  SARS Coronavirus 2 [facility] order, Performed in [facility] hospital lab) Nasopharyngeal Nasopharyngeal Swab     Status: None   Collection Time: 02/14/19  9:37 PM   Specimen: Nasopharyngeal Swab  Result Value Ref Range Status   SARS Coronavirus 2 NEGATIVE NEGATIVE Final    Comment: (NOTE) If result is NEGATIVE SARS-CoV-2 target nucleic acids are NOT DETECTED. The SARS-CoV-2 RNA is generally detectable in upper and lower  respiratory specimens during the acute phase of infection. The lowest  concentration of SARS-CoV-2 viral copies this assay can detect is 250  copies / mL. A negative result does not preclude SARS-CoV-2 infection  and  should not be used as the sole basis for treatment or other  patient management decisions.  A negative result may occur with  improper specimen collection / handling, submission of specimen other  than nasopharyngeal swab, presence of viral mutation(s) within the  areas targeted by this assay, and inadequate number of viral copies  (<250 copies / mL). A negative result must be combined with clinical  observations, patient history, and epidemiological information. If result is POSITIVE SARS-CoV-2 target nucleic acids are DETECTED. The SARS-CoV-2 RNA is generally detectable in upper and lower  respiratory specimens dur ing the acute phase of infection.  Positive  results are indicative of active infection with SARS-CoV-2.  Clinical  correlation with patient history and other diagnostic information is  necessary to determine patient infection status.  Positive results do  not rule out bacterial infection or co-infection with other viruses. If result is PRESUMPTIVE POSTIVE SARS-CoV-2 nucleic acids MAY BE PRESENT.   A presumptive positive result was obtained on the submitted specimen  and confirmed on repeat testing.  While 2019 novel coronavirus  (SARS-CoV-2) nucleic acids may be present in the submitted sample  additional confirmatory testing may be necessary for epidemiological  and / or clinical management purposes  to differentiate between  SARS-CoV-2 and other Sarbecovirus currently known to infect humans.  If clinically indicated additional testing with an alternate test  methodology [phone]) is advised. The SARS-CoV-2 RNA is generally  detectable in upper and lower respiratory sp ecimens during the acute  phase of infection. The expected result is Negative. Fact Sheet for Patients:  StrictlyIdeas.no Fact Sheet for Healthcare Providers: BankingDealers.co.za This test is not yet approved or cleared by the [place] FDA and has been  authorized for detection and/or diagnosis of SARS-CoV-2 by FDA under an Emergency Use Authorization (EUA).  This EUA will remain in effect (meaning this test can be used) for the duration of the COVID-19 declaration under Section 564(b)(1) of the Act, 21 U.S.C. section 360bbb-3(b)(1), unless the authorization is terminated or revoked sooner. Performed at [facility], [place] [address], [place], [place] [address]   Blood Culture (routine x 2)     Status: None (Preliminary result)   Collection Time: 02/14/19  9:48 PM   Specimen: BLOOD RIGHT ARM  Result Value Ref Range Status   Specimen Description BLOOD RIGHT ARM  Final   Special Requests   Final    BOTTLES DRAWN AEROBIC AND ANAEROBIC Blood Culture adequate volume   Culture   Final    NO GROWTH 2 DAYS Performed at [facility], [place] [address], [place], [place] [address]    Report Status PENDING  Incomplete  Urine culture     Status: None   Collection Time: 02/14/19  9:49 PM   Specimen: In/Out Cath Urine  Result Value Ref Range Status   Specimen Description IN/OUT CATH URINE  Final   Special Requests NONE  Final   Culture   Final    NO GROWTH Performed at [facility], [place] [address] [place], [place] [address]    Report Status 02/15/2019 FINAL  Final     Time coordinating discharge: Over 30 minutes  SIGNED:   Little Ishikawa, DO [facility] 02/18/2019, 8:03 AM Pager   If 7PM-7AM, please contact night-coverage [email or web address] Password [email or web address]

## 2019-02-18 NOTE — Progress Notes (Signed)
Pt discharged home in stable condition 

## 2019-02-19 LAB — CULTURE, BLOOD (ROUTINE X 2)
Culture: NO GROWTH
Culture: NO GROWTH
Special Requests: ADEQUATE
Special Requests: ADEQUATE

## 2019-02-23 ENCOUNTER — Encounter: Payer: Self-pay | Admitting: Adult Health

## 2019-02-23 ENCOUNTER — Other Ambulatory Visit: Payer: Self-pay

## 2019-02-23 ENCOUNTER — Ambulatory Visit: Payer: Medicare Other | Admitting: Adult Health

## 2019-02-23 DIAGNOSIS — N12 Tubulo-interstitial nephritis, not specified as acute or chronic: Secondary | ICD-10-CM | POA: Diagnosis not present

## 2019-02-23 DIAGNOSIS — J4540 Moderate persistent asthma, uncomplicated: Secondary | ICD-10-CM

## 2019-02-23 MED ORDER — BREO ELLIPTA 100-25 MCG/INH IN AEPB: INHALATION_SPRAY | RESPIRATORY_TRACT | 5 refills | Status: DC

## 2019-02-23 MED ORDER — ALBUTEROL SULFATE HFA 108 (90 BASE) MCG/ACT IN AERS: 2.0000 | INHALATION_SPRAY | Freq: Four times a day (QID) | RESPIRATORY_TRACT | 5 refills | Status: AC | PRN

## 2019-02-23 NOTE — Patient Instructions (Addendum)
Continue on BREO daily .  Mucinex DM Twice daily  As needed  Cough/congesiton  Activity as tolerated.  Get flu shot as discussed.  Follow up with Dr. Elsworth Soho  In 6 months and As needed   Please contact office for sooner follow up if symptoms do not improve or worsen or seek emergency care ]

## 2019-02-23 NOTE — Progress Notes (Signed)
@Patient  ID: Connor Pontiff Sr., male    DOB: 1945/06/26, 73 y.o.   MRN: [id number]  Chief Complaint  Patient presents with   Follow-up    COPD     Referring provider: Dorothyann Peng, NP  HPI: 73 yo male former smoker followed for COPD./Asthma Medical history significant for bladder cancer status post transurethral resection of bladder tumor and intravesicular instillation of chemotherapy August 2020.,  Hypertension, chronic kidney disease stage [name].  TEST [facility]  CRX 5/31 &6/9 reviewed -hyperinflation, no obvious infx  CT chest from 10/2006 reviewed  Spirometry showed FEV1 43% - 1.31 - with ratio 53  Post bronchodilator, FEV1 improved to 1.56 -significant bronchodilator response.   IgE 216, RAST neg   02/23/2019 Follow up : Asthma, Post hospital follow up  Patient presents for a follow-up visit.  Last seen in the office May 2019.  Patient was recently hospitalized last week for pyelonephritis he was treated with aggressive IV antibiotics.  He was discharged on Keflex for additional 7 days.  Since discharge patient says he is feeling better. Appetite is improving.  Patient has underlying COPD with asthma he remains on Breo daily and DuoNeb nebulizer as needed.  Says his breathing is doing okay with no flare of cough or wheezing.  CT chest showed mild emphysema, no acute process.  CT abdomen showed a right-sided pyelonephritis and nonobstructing bilateral renal calculi     No Known Allergies  Immunization History  Administered Date(s) Administered   Pneumococcal Polysaccharide-23 02/18/2019    Past Medical History:  Diagnosis Date   Anxiety    pt. denies   Arthritis    and back problems   Cancer ([facility])    Bladder Ca 2013, surgically removed   COPD (chronic obstructive pulmonary disease) ([place])    History of kidney stones    once  2013 had surgery   Hypertension    Shortness of breath    with exertion    Tobacco History: Social History   Tobacco  Use  Smoking Status Former Smoker   Packs/day: 1.00   Years: 50.00   Pack years: 50.00   Types: Cigarettes   Quit date: 09/21/2013   Years since quitting: 5.4  Smokeless Tobacco Never Used   Counseling given: Not Answered   Outpatient Medications Prior to Visit  Medication Sig Dispense Refill   albuterol (PROVENTIL) (2.5 MG/3ML) 0.083% nebulizer solution Take 3 mLs (2.5 mg total) by nebulization every 6 (six) hours as needed for wheezing or shortness of breath. 75 mL 12   amLODipine (NORVASC) 10 MG tablet TAKE 1 TABLET(10 MG) BY MOUTH DAILY (Patient taking differently: Take 10 mg by mouth daily. ) 90 tablet 3   atorvastatin (LIPITOR) 20 MG tablet TAKE 1 TABLET BY MOUTH BEFORE BEDTIME TO REDUCE CHOLESTEROL (Patient taking differently: Take 20 mg by mouth at bedtime. ) 90 tablet 3   cephALEXin (KEFLEX) 500 MG capsule Take 1 capsule (500 mg total) by mouth 2 (two) times daily for 7 days. 14 capsule 0   gabapentin (NEURONTIN) 300 MG capsule TAKE 1 CAPSULE(300 MG) BY MOUTH TWICE DAILY (Patient taking differently: Take 300 mg by mouth 2 (two) times daily as needed (pain). ) 180 capsule 1   ipratropium-albuterol (DUONEB) 0.5-2.5 (3) MG/3ML SOLN Take 3 mLs by nebulization every 6 (six) hours as needed. For shortness of breath 180 mL 3   ketoconazole (NIZORAL) 2 % cream Apply 1 application topically daily. 15 g 0   losartan (COZAAR) 100 MG tablet TAKE  1 TABLET BY MOUTH DAILY (Patient taking differently: Take 100 mg by mouth daily. ) 90 tablet 3   tamsulosin (FLOMAX) 0.4 MG CAPS capsule Take 0.4 mg by mouth daily.   6   albuterol (PROVENTIL HFA;VENTOLIN HFA) 108 (90 Base) MCG/ACT inhaler Inhale 2 puffs into the lungs every 6 (six) hours as needed for wheezing or shortness of breath. 1 Inhaler 0   BREO ELLIPTA 100-25 MCG/INH AEPB INHALE 1 PUFF INTO THE LUNGS DAILY (Patient taking differently: Inhale 1 puff into the lungs daily. ) 60 each 0   fluticasone furoate-vilanterol (BREO  ELLIPTA) 100-25 MCG/INH AEPB Inhale 1 puff into the lungs daily. (Patient not taking: Reported on 01/21/2019) 1 each 0   No facility-administered medications prior to visit.      Review of Systems:   Constitutional:   No  weight loss, night sweats,  Fevers, chills, + fatigue, or  lassitude.  HEENT:   No headaches,  Difficulty swallowing,  Tooth/dental problems, or  Sore throat,                No sneezing, itching, ear ache, nasal congestion, post nasal drip,   CV:  No chest pain,  Orthopnea, PND, swelling in lower extremities, anasarca, dizziness, palpitations, syncope.   GI  No heartburn, indigestion, abdominal pain, nausea, vomiting, diarrhea, change in bowel habits, loss of appetite, bloody stools.   Resp: .  No chest wall deformity  Skin: no rash or lesions.  GU: no dysuria, change in color of urine, no urgency or frequency.  No flank pain, no hematuria   MS:  No joint pain or swelling.  No decreased range of motion.  No back pain.    Physical Exam  BP 132/60 (BP Location: Left Arm, Cuff Size: Normal)    Pulse 73    Temp 97.6 F (36.4 C) (Temporal)    Ht 5\' 7"  (1.702 m)    Wt 161 lb 9.6 oz (73.3 kg)    SpO2 97%    BMI 25.31 kg/m   GEN: A/Ox3; pleasant , NAD, thin and elderly   HEENT:  [place]/AT,   NOSE-clear, THROAT-clear, no lesions, no postnasal drip or exudate noted.   NECK:  Supple w/ fair ROM; no JVD; normal carotid impulses w/o bruits; no thyromegaly or nodules palpated; no lymphadenopathy.    RESP decreased breath sounds in the bases no accessory muscle use, no dullness to percussion  CARD:  RRR, no m/r/g, no peripheral edema, pulses intact, no cyanosis or clubbing.  GI:   Soft & nt; nml bowel sounds; no organomegaly or masses detected.   Musco: Warm bil, no deformities or joint swelling noted.   Neuro: alert, no focal deficits noted.    Skin: Warm, no lesions or rashes    Lab Results:    BNP   Imaging: Ct Angio Chest Pe W/cm &/or Wo Cm  Result  Date: 02/14/2019 CLINICAL DATA:  73 year old male with fever, chills, loss of appetite. EXAM: CT ANGIOGRAPHY CHEST CT ABDOMEN AND PELVIS WITH CONTRAST TECHNIQUE: Multidetector CT imaging of the chest was performed using the standard protocol during bolus administration of intravenous contrast. Multiplanar CT image reconstructions and MIPs were obtained to evaluate the vascular anatomy. Multidetector CT imaging of the abdomen and pelvis was performed using the standard protocol during bolus administration of intravenous contrast. CONTRAST:  30mL OMNIPAQUE IOHEXOL 350 MG/ML SOLN COMPARISON:  Chest radiograph dated 02/14/2019. FINDINGS: CTA CHEST FINDINGS Cardiovascular: There is no cardiomegaly or pericardial effusion. Coronary vascular calcification of  the LAD. Moderate calcified and noncalcified plaque of the aortic wall. No aneurysmal dilatation or dissection. The origins of the great vessels of the aortic arch appear patent as visualized. No pulmonary artery embolus identified. Mediastinum/Nodes: There is no hilar or mediastinal adenopathy. The esophagus and the thyroid gland are grossly unremarkable. No mediastinal fluid collection. Lungs/Pleura: There is mild centrilobular emphysema. Bibasilar subpleural linear atelectasis/scarring. There is no focal consolidation, pleural effusion, or pneumothorax. The central airways are patent. Musculoskeletal: Degenerative changes of the spine. No acute osseous pathology. Review of the MIP images confirms the above findings. CT ABDOMEN and PELVIS FINDINGS No intra-abdominal free air or free fluid. Hepatobiliary: The liver is unremarkable. No intrahepatic biliary ductal dilatation. No calcified gallstone or pericholecystic fluid. Pancreas: Unremarkable. No pancreatic ductal dilatation or surrounding inflammatory changes. Spleen: Splenectomy. Adrenals/Urinary Tract: There is a 5 mm nonobstructing right renal inferior pole calculus. There is a 3 mm nonobstructing left renal  inferior pole calculus. There is no hydronephrosis on either side. Multiple bilateral renal hypodense lesions measure up to 15 mm in the upper pole of the right kidney. Larger lesions demonstrate fluid attenuation most consistent with cysts and the smaller lesions are too small to characterize. There is heterogeneous enhancement of the right renal parenchyma with areas of decreased nephrogram consistent with right-sided pyelonephritis. No abscess. Correlation with urinalysis recommended. Mild left pyelonephritis may also be present. The visualized ureters appear unremarkable. The urinary bladder is grossly unremarkable. Stomach/Bowel: There is no bowel obstruction or active inflammation. The appendix is normal. Vascular/Lymphatic: Moderate aortoiliac atherosclerotic disease. The IVC is unremarkable. No portal venous gas. There is no adenopathy. Reproductive: Mildly enlarged prostate gland measuring 5.5 cm in transverse axial diameter. The seminal vesicles are symmetric. Other: None Musculoskeletal: Degenerative changes of the spine. No acute osseous pathology. Review of the MIP images confirms the above findings. IMPRESSION: 1. No acute intrathoracic pathology. No CT evidence of pulmonary artery embolus. 2. Right-sided pyelonephritis. Correlation with urinalysis recommended. No abscess. 3. Nonobstructing bilateral renal calculi. No hydronephrosis. Aortic Atherosclerosis (ICD10-I70.0) and Emphysema (ICD10-J43.9). Electronically Signed   By: Anner Crete M.D.   On: 02/14/2019 22:32   Ct Abdomen Pelvis W Contrast  Result Date: 02/14/2019 CLINICAL DATA:  73 year old male with fever, chills, loss of appetite. EXAM: CT ANGIOGRAPHY CHEST CT ABDOMEN AND PELVIS WITH CONTRAST TECHNIQUE: Multidetector CT imaging of the chest was performed using the standard protocol during bolus administration of intravenous contrast. Multiplanar CT image reconstructions and MIPs were obtained to evaluate the vascular anatomy.  Multidetector CT imaging of the abdomen and pelvis was performed using the standard protocol during bolus administration of intravenous contrast. CONTRAST:  18mL OMNIPAQUE IOHEXOL 350 MG/ML SOLN COMPARISON:  Chest radiograph dated 02/14/2019. FINDINGS: CTA CHEST FINDINGS Cardiovascular: There is no cardiomegaly or pericardial effusion. Coronary vascular calcification of the LAD. Moderate calcified and noncalcified plaque of the aortic wall. No aneurysmal dilatation or dissection. The origins of the great vessels of the aortic arch appear patent as visualized. No pulmonary artery embolus identified. Mediastinum/Nodes: There is no hilar or mediastinal adenopathy. The esophagus and the thyroid gland are grossly unremarkable. No mediastinal fluid collection. Lungs/Pleura: There is mild centrilobular emphysema. Bibasilar subpleural linear atelectasis/scarring. There is no focal consolidation, pleural effusion, or pneumothorax. The central airways are patent. Musculoskeletal: Degenerative changes of the spine. No acute osseous pathology. Review of the MIP images confirms the above findings. CT ABDOMEN and PELVIS FINDINGS No intra-abdominal free air or free fluid. Hepatobiliary: The liver is unremarkable. No intrahepatic biliary  ductal dilatation. No calcified gallstone or pericholecystic fluid. Pancreas: Unremarkable. No pancreatic ductal dilatation or surrounding inflammatory changes. Spleen: Splenectomy. Adrenals/Urinary Tract: There is a 5 mm nonobstructing right renal inferior pole calculus. There is a 3 mm nonobstructing left renal inferior pole calculus. There is no hydronephrosis on either side. Multiple bilateral renal hypodense lesions measure up to 15 mm in the upper pole of the right kidney. Larger lesions demonstrate fluid attenuation most consistent with cysts and the smaller lesions are too small to characterize. There is heterogeneous enhancement of the right renal parenchyma with areas of decreased  nephrogram consistent with right-sided pyelonephritis. No abscess. Correlation with urinalysis recommended. Mild left pyelonephritis may also be present. The visualized ureters appear unremarkable. The urinary bladder is grossly unremarkable. Stomach/Bowel: There is no bowel obstruction or active inflammation. The appendix is normal. Vascular/Lymphatic: Moderate aortoiliac atherosclerotic disease. The IVC is unremarkable. No portal venous gas. There is no adenopathy. Reproductive: Mildly enlarged prostate gland measuring 5.5 cm in transverse axial diameter. The seminal vesicles are symmetric. Other: None Musculoskeletal: Degenerative changes of the spine. No acute osseous pathology. Review of the MIP images confirms the above findings. IMPRESSION: 1. No acute intrathoracic pathology. No CT evidence of pulmonary artery embolus. 2. Right-sided pyelonephritis. Correlation with urinalysis recommended. No abscess. 3. Nonobstructing bilateral renal calculi. No hydronephrosis. Aortic Atherosclerosis (ICD10-I70.0) and Emphysema (ICD10-J43.9). Electronically Signed   By: Anner Crete M.D.   On: 02/14/2019 22:32   Dg Chest Port 1 View  Result Date: 02/14/2019 CLINICAL DATA:  Fever and chills. Bladder surgery 2 weeks ago. History of COPD and hypertension. EXAM: PORTABLE CHEST 1 VIEW COMPARISON:  04/07/2017 FINDINGS: Normal sized heart. Tortuous aorta. Clear lungs. The lungs remain hyperexpanded. Stable mild elevation of the left hemidiaphragm. Unremarkable bones. IMPRESSION: No acute abnormality.  Stable changes of COPD. Electronically Signed   By: Claudie Revering M.D.   On: 02/14/2019 21:47   US Abdomen Limited Ruq  Result Date: 02/15/2019 CLINICAL DATA:  Elevated LFTs EXAM: ULTRASOUND ABDOMEN LIMITED RIGHT UPPER QUADRANT COMPARISON:  CT 02/14/2019 FINDINGS: Gallbladder: Ring down artifact from the anterior gallbladder wall compatible with adenomyomatosis. No stones or wall thickening. Common bile duct: Diameter:  Normal caliber, 3 mm Liver: Increased echotexture compatible with fatty infiltration. No focal abnormality or biliary ductal dilatation. Portal vein is patent on color Doppler imaging with normal direction of blood flow towards the liver. Other: None. IMPRESSION: Fatty infiltration of the liver. Adenomyomatosis of the gallbladder wall. No cholelithiasis or acute cholecystitis. Electronically Signed   By: Rolm Baptise M.D.   On: 02/15/2019 00:37      No flowsheet data found.  No results found for: NITRICOXIDE      Assessment & Plan:   Asthma Controlled on current regimen Continue on Breo  Plan  Patient Instructions  Continue on BREO daily .  Mucinex DM Twice daily  As needed  Cough/congesiton  Activity as tolerated.  Get flu shot as discussed.  Follow up with Dr. Elsworth Soho  In 6 months and As needed   Please contact office for sooner follow up if symptoms do not improve or worsen or seek emergency care ]        Pyelonephritis Recent hospitalization after bladder cancer surgery and treatment clinically appears to be improving continue follow-up with urology     Rexene Edison, NP 02/23/2019

## 2019-02-23 NOTE — Assessment & Plan Note (Signed)
Recent hospitalization after bladder cancer surgery and treatment clinically appears to be improving continue follow-up with urology

## 2019-02-23 NOTE — Assessment & Plan Note (Signed)
Controlled on current regimen Continue on Breo  Plan  Patient Instructions  Continue on BREO daily .  Mucinex DM Twice daily  As needed  Cough/congesiton  Activity as tolerated.  Get flu shot as discussed.  Follow up with Dr. Elsworth Soho  In 6 months and As needed   Please contact office for sooner follow up if symptoms do not improve or worsen or seek emergency care ]

## 2019-05-20 ENCOUNTER — Telehealth: Payer: Self-pay | Admitting: Adult Health

## 2019-05-20 NOTE — Telephone Encounter (Signed)
Copied from [place] [id number]. Topic: Quick Communication - Rx Refill/Question >> May 20, 2019  3:06 PM Izola Price, [place] A wrote: Medication: medication for congestion (Patients wife called and is requesting medication be called in for patients congestion.)  Has the patient contacted their pharmacy? Yes (Agent: If no, request that the patient contact the pharmacy for the refill.) (Agent: If yes, when and what did the pharmacy advise?)Contact PCP  Preferred Pharmacy (with phone number or street name): [facility], [address]  Phone:  [phone] Fax:  [phone]     Agent: Please be advised that RX refills may take up to 3 business days. We ask that you follow-up with your pharmacy.

## 2019-05-24 ENCOUNTER — Telehealth (INDEPENDENT_AMBULATORY_CARE_PROVIDER_SITE_OTHER): Payer: Medicare Other | Admitting: Adult Health

## 2019-05-24 ENCOUNTER — Other Ambulatory Visit: Payer: Self-pay

## 2019-05-24 DIAGNOSIS — J0100 Acute maxillary sinusitis, unspecified: Secondary | ICD-10-CM | POA: Diagnosis not present

## 2019-05-24 MED ORDER — DOXYCYCLINE HYCLATE 100 MG PO CAPS: 100.0000 mg | ORAL_CAPSULE | Freq: Two times a day (BID) | ORAL | 0 refills | Status: DC

## 2019-05-24 NOTE — Progress Notes (Signed)
Virtual Visit via Telephone Note  I connected with Connor Dasmine Troeger Sr. on 05/24/19 at  4:30 PM EST by telephone and verified that I am speaking with the correct person using two identifiers.   I discussed the limitations, risks, security and privacy concerns of performing an evaluation and management service by telephone and the availability of in person appointments. I also discussed with the patient that there may be a patient responsible charge related to this service. The patient expressed understanding and agreed to proceed.  Location patient: home Location provider: work or home office Participants present for the call: patient, provider Patient did not have a visit in the prior 7 days to address this/these issue(s).   History of Present Illness: 73 year old male who is being evaluated today for an acute issue.  His symptoms include sinus pain and pressure, nasal congestion, productive cough, postnasal drip, and a sore throat.  He denies fevers or chills and has no loss of taste or smell.  He has not had any Covid exposure and no one at his home is sick.  He has been using various over-the-counter remedies such as Coricidin and Flonase nasal spray with very minimal relief.  His symptoms have been present for 2 weeks and seem to be getting worse.   Observations/Objective: Patient sounds cheerful and well on the phone. I do not appreciate any SOB. Speech and thought processing are grossly intact. Patient reported vitals:  Assessment and Plan: 1. Acute non-recurrent maxillary sinusitis -Treat for acute maxillary sinusitis due to symptoms and duration of time.  He was advised to continue to use Flonase but all other over-the-counter medications can be discontinued.  Follow-up in 2 to 3 days if no improvement - doxycycline (VIBRAMYCIN) 100 MG capsule; Take 1 capsule (100 mg total) by mouth 2 (two) times daily.  Dispense: 14 capsule; Refill: 0   Follow Up Instructions:   I did not  refer this patient for an OV in the next 24 hours for this/these issue(s).  I discussed the assessment and treatment plan with the patient. The patient was provided an opportunity to ask questions and all were answered. The patient agreed with the plan and demonstrated an understanding of the instructions.   The patient was advised to call back or seek an in-person evaluation if the symptoms worsen or if the condition fails to improve as anticipated.  I provided 18 minutes of non-face-to-face time during this encounter.   Dorothyann Peng, NP

## 2019-05-24 NOTE — Telephone Encounter (Signed)
Pt scheduled for telephone call today at 4:30.  Nothing further needed.

## 2019-05-26 ENCOUNTER — Ambulatory Visit (INDEPENDENT_AMBULATORY_CARE_PROVIDER_SITE_OTHER): Payer: Medicare Other | Admitting: Adult Health

## 2019-05-26 ENCOUNTER — Encounter: Payer: Self-pay | Admitting: Adult Health

## 2019-05-26 ENCOUNTER — Other Ambulatory Visit: Payer: Self-pay

## 2019-05-26 DIAGNOSIS — J4541 Moderate persistent asthma with (acute) exacerbation: Secondary | ICD-10-CM

## 2019-05-26 DIAGNOSIS — J019 Acute sinusitis, unspecified: Secondary | ICD-10-CM | POA: Diagnosis not present

## 2019-05-26 MED ORDER — PREDNISONE 10 MG PO TABS: ORAL_TABLET | ORAL | 0 refills | Status: DC

## 2019-05-26 NOTE — Progress Notes (Signed)
Virtual Visit via Telephone Note  I connected with Connor Daemon Toruno Sr. on 05/26/19 at  2:00 PM EST by telephone and verified that I am speaking with the correct person using two identifiers.  Location: Patient: Home  Provider: Office    I discussed the limitations, risks, security and privacy concerns of performing an evaluation and management service by telephone and the availability of in person appointments. I also discussed with the patient that there may be a patient responsible charge related to this service. The patient expressed understanding and agreed to proceed.   History of Present Illness:  73 year old male former smoker followed for COPD and asthma Medical history significant for bladder cancer status post transurethral resection of the bladder tumor and intravesicular instillation of chemotherapy August 2020, hypertension, chronic kidney disease stage [name]  Today's televisit is a follow-up for sinus infection.  Patient complains over the last 2 weeks he has had nasal congestion sinus pressure postnasal discharge and thick nasal drainage. Patient was seen via telemedicine at his primary care office.  On December 15.  Diagnosed with a sinusitis.  Given doxycycline.  Patient says he started this yesterday but has had no significant improvement in symptoms.  Patient has been using Afrin for the last 3 to 4 days. He denies any loss of taste or smell, fever, known sick contacts.  Patient lives with his wife who is in well health.  Patient denies any chest pain, orthopnea, edema.  Has no cough.  He denies is any wheezing or albuterol use. He does have underlying asthma.  Is on Breo daily.     Patient Active Problem List   Diagnosis Date Noted  . Sepsis ([place]) 02/15/2019  . AKI (acute kidney injury) ([place]) 02/15/2019  . Hyponatremia 02/15/2019  . Metabolic acidosis [id number]  . Pyelonephritis 02/14/2019  . Metatarsalgia of both feet 11/15/2017  . Asthma 10/15/2017  . Anxiety  10/01/2017  . Rhinitis 08/14/2017  . BLADDER CANCER 10/02/2007  . Hyperlipidemia 10/02/2007  . ALCOHOL ABUSE 10/02/2007  . EROSIVE ESOPHAGITIS 10/02/2007  . ESOPHAGEAL STENOSIS 10/02/2007  . HIATAL HERNIA 10/02/2007  . FATIGUE 10/02/2007  . Essential hypertension 12/26/2006  . GERD 12/26/2006  . HEADACHE 12/26/2006   Current Outpatient Medications on File Prior to Visit  Medication Sig Dispense Refill  . albuterol (PROVENTIL) (2.5 MG/3ML) 0.083% nebulizer solution Take 3 mLs (2.5 mg total) by nebulization every 6 (six) hours as needed for wheezing or shortness of breath. 75 mL 12  . albuterol (VENTOLIN HFA) 108 (90 Base) MCG/ACT inhaler Inhale 2 puffs into the lungs every 6 (six) hours as needed for wheezing or shortness of breath. 8 g 5  . amLODipine (NORVASC) 10 MG tablet TAKE 1 TABLET(10 MG) BY MOUTH DAILY (Patient taking differently: Take 10 mg by mouth daily. ) 90 tablet 3  . atorvastatin (LIPITOR) 20 MG tablet TAKE 1 TABLET BY MOUTH BEFORE BEDTIME TO REDUCE CHOLESTEROL (Patient taking differently: Take 20 mg by mouth at bedtime. ) 90 tablet 3  . fluticasone furoate-vilanterol (BREO ELLIPTA) 100-25 MCG/INH AEPB INHALE 1 PUFF INTO THE LUNGS DAILY 60 each 5  . gabapentin (NEURONTIN) 300 MG capsule TAKE 1 CAPSULE(300 MG) BY MOUTH TWICE DAILY (Patient taking differently: Take 300 mg by mouth 2 (two) times daily as needed (pain). ) 180 capsule 1  . ipratropium-albuterol (DUONEB) 0.5-2.5 (3) MG/3ML SOLN Take 3 mLs by nebulization every 6 (six) hours as needed. For shortness of breath 180 mL 3  . tamsulosin (FLOMAX) 0.4 MG CAPS  capsule Take 0.4 mg by mouth daily.   6  . losartan (COZAAR) 100 MG tablet TAKE 1 TABLET BY MOUTH DAILY (Patient taking differently: Take 100 mg by mouth daily. ) 90 tablet 3   No current facility-administered medications on file prior to visit.    Observations/Objective: 05/26/2019 -speaks in full sentences with no audible wheezing   CRX 5/31 &6/9 reviewed  -hyperinflation, no obvious infx  CT chest from 10/2006 reviewed  Spirometry showed FEV1 43% - 1.31 - with ratio 53  Post bronchodilator, FEV1 improved to 1.56 -significant bronchodilator response.   IgE 216, RAST neg  Assessment and Plan: Acute sinusitis.  Patient education on continuation of antibiotics that it may take more than a few days to start to see symptom improvement.  With stop Afrin at this time as may make symptoms worse. Add saline nasal rinses 2-3 times a day.  Add in saline nasal gel at bedtime.  Added Mucinex. We will give a short steroid burst for 3 days. -Low suspicion for COVID-19 however advised patient if he develops red flag symptoms such as shortness of breath, fever loss of taste or smell, GI symptoms he is to contact us immediately and we will need to get testing.   Asthma-appears to be stable.  We will continue on Breo.  Plan  Patient Instructions  Finish Doxycycline as prescribed. Take with food.  Mucinex Twice daily  As needed  Congestion  Stop Afrin nasal spray -nasal decongestant.  Begin Saline Nasal rinses Three times a day   Begin Saline nasal gel At bedtime   Prednisone 20mg  daily for 3 days .  Continue on BREO 1 puff daily , rinse after use.  Follow up with Dr. Elsworth Soho  In 6-8 weeks and As needed   Please contact office for sooner follow up if symptoms do not improve or worsen or seek emergency care        Follow Up Instructions: Follow-up in 6 to 8 weeks and as needed  Please contact office for sooner follow up if symptoms do not improve or worsen or seek emergency care     I discussed the assessment and treatment plan with the patient. The patient was provided an opportunity to ask questions and all were answered. The patient agreed with the plan and demonstrated an understanding of the instructions.   The patient was advised to call back or seek an in-person evaluation if the symptoms worsen or if the condition fails to improve as  anticipated.  I provided 23 minutes of non-face-to-face time during this encounter.   Rexene Edison, NP

## 2019-05-26 NOTE — Patient Instructions (Addendum)
Finish Doxycycline as prescribed. Take with food.  Mucinex Twice daily  As needed  Congestion  Stop Afrin nasal spray -nasal decongestant.  Begin Saline Nasal rinses Three times a day   Begin Saline nasal gel At bedtime   Prednisone 20mg  daily for 3 days .  Continue on BREO 1 puff daily , rinse after use.  Follow up with Dr. Elsworth Soho  In 6-8 weeks and As needed   Please contact office for sooner follow up if symptoms do not improve or worsen or seek emergency care

## 2019-06-06 ENCOUNTER — Telehealth: Payer: Self-pay | Admitting: Pulmonary Disease

## 2019-06-06 NOTE — Telephone Encounter (Signed)
Use of Zyrtec or Xyzal will be of benefit-both are over-the-counter, once a day May add a nasal steroid like Flonase or Nasonex-2 sprays each nostril daily  If he has recently used a complete course of antibiotics-antibiotics does not need to be repeated at present

## 2019-06-06 NOTE — Telephone Encounter (Signed)
Left message for patient to call back  

## 2019-06-06 NOTE — Telephone Encounter (Signed)
Spoke with both pt and wife, states that he is still having a runny/stuffy nose and watery eyes.  Pt states mucus is clear.   Pt had a televisit with TP on 12/17 where pt was treated for a sinus infection.  AVS copied below:  Instructions    Return in about 2 months (around 07/27/2019) for Follow up with Dr. Elsworth Soho. Finish Doxycycline as prescribed. Take with food.  Mucinex Twice daily  As needed  Congestion  Stop Afrin nasal spray -nasal decongestant.  Begin Saline Nasal rinses Three times a day   Begin Saline nasal gel At bedtime   Prednisone 20mg  daily for 3 days .  Continue on BREO 1 puff daily , rinse after use.  Follow up with Dr. Elsworth Soho  In 6-8 weeks and As needed   Please contact office for sooner follow up if symptoms do not improve or worsen or seek emergency care      Pt stated he felt almost back to normal when taking the doxycline but has worsened since finishing it.  Pt is still taking mucinex and saline rinse/gel, as well as an otc cold and flu pill (pharmacy brand).   Pt is requesting additional recs.  Pharmacy: [facility] on [address].   Sending to AO as RA is not here and no NPs are here either.  Please advise, thanks!

## 2019-06-07 NOTE — Telephone Encounter (Signed)
Pt's wife is returning calls-didn't see any missed calls from our office & thought no one had called back.  Let her know we tried to reach her 3 times.  Can be reached at [phone].

## 2019-06-07 NOTE — Telephone Encounter (Signed)
LMTCB x2 for pt's wife.  

## 2019-06-07 NOTE — Telephone Encounter (Signed)
Called the patient and made him aware of the recommendations received from Dr. Ander Slade. Patient voiced understanding. Nothing further needed at this time.

## 2019-06-15 ENCOUNTER — Telehealth: Payer: Self-pay | Admitting: *Deleted

## 2019-06-15 NOTE — Telephone Encounter (Signed)
Copied from [place] [id number]. Topic: General - Other >> Jun 15, 2019  8:53 AM Leward Quan A wrote: Reason for CRM: Patient wife called in to inform Dorothyann Peng that he is having some problem with itchy eyes and sinus congestion in order to breathe he has to blow his nose. Per patient he can not lay down because of the trouble breathing. Asking for something called in to the pharmacy to help also starting to cough.  Ph#  [phone]

## 2019-06-26 ENCOUNTER — Ambulatory Visit (HOSPITAL_COMMUNITY)
Admission: EM | Admit: 2019-06-26 | Discharge: 2019-06-26 | Disposition: A | Discharge status: Home / Self Care | Payer: Medicare Other | Attending: Family Medicine | Admitting: Family Medicine

## 2019-06-26 ENCOUNTER — Encounter (HOSPITAL_COMMUNITY): Payer: Self-pay

## 2019-06-26 ENCOUNTER — Other Ambulatory Visit: Payer: Self-pay

## 2019-06-26 DIAGNOSIS — Z79899 Other long term (current) drug therapy: Secondary | ICD-10-CM | POA: Diagnosis not present

## 2019-06-26 DIAGNOSIS — Z8551 Personal history of malignant neoplasm of bladder: Secondary | ICD-10-CM | POA: Insufficient documentation

## 2019-06-26 DIAGNOSIS — Z87891 Personal history of nicotine dependence: Secondary | ICD-10-CM | POA: Insufficient documentation

## 2019-06-26 DIAGNOSIS — F419 Anxiety disorder, unspecified: Secondary | ICD-10-CM | POA: Insufficient documentation

## 2019-06-26 DIAGNOSIS — I10 Essential (primary) hypertension: Secondary | ICD-10-CM | POA: Diagnosis not present

## 2019-06-26 DIAGNOSIS — J449 Chronic obstructive pulmonary disease, unspecified: Secondary | ICD-10-CM | POA: Diagnosis not present

## 2019-06-26 DIAGNOSIS — J069 Acute upper respiratory infection, unspecified: Secondary | ICD-10-CM | POA: Insufficient documentation

## 2019-06-26 DIAGNOSIS — Z20822 Contact with and (suspected) exposure to covid-19: Secondary | ICD-10-CM | POA: Insufficient documentation

## 2019-06-26 DIAGNOSIS — K219 Gastro-esophageal reflux disease without esophagitis: Secondary | ICD-10-CM | POA: Insufficient documentation

## 2019-06-26 DIAGNOSIS — E785 Hyperlipidemia, unspecified: Secondary | ICD-10-CM | POA: Insufficient documentation

## 2019-06-26 MED ORDER — SALINE SPRAY 0.65 % NA SOLN: 1.0000 | NASAL | 0 refills | Status: DC | PRN

## 2019-06-26 MED ORDER — AFRIN NASAL SPRAY 0.05 % NA SOLN: 1.0000 | Freq: Two times a day (BID) | NASAL | 0 refills | Status: AC

## 2019-06-26 NOTE — Discharge Instructions (Addendum)
Use the afrin 2 times a day for 3 days only.  Use the nasal saline throughout the day.   Take tylenol 325mg  tablet x 2 up to every 6 hours for sore throat, fever and body aches. Do not exceed 8 tablets in 24 hours  Go directly to the Emergency Department or call 911 if you have severe chest pain, shortness of breath, severe diarrhea or feel as though you might pass out.    If your Covid-19 test is positive, you will receive a phone call from [facility] regarding your results. Negative test results are not called. Both positive and negative results area always visible on MyChart. If you do not have a MyChart account, sign up instructions are in your discharge papers.   Persons who are directed to care for themselves at home may discontinue isolation under the following conditions:   At least 10 days have passed since symptom onset and  At least 24 hours have passed without running a fever (this means without the use of fever-reducing medications) and  Other symptoms have improved.  Persons infected with COVID-19 who never develop symptoms may discontinue isolation and other precautions 10 days after the date of their first positive COVID-19 test.

## 2019-06-26 NOTE — ED Provider Notes (Signed)
[place]    CSN: [id number] Arrival date & time: 06/26/19  1712      History   Chief Complaint Chief Complaint  Patient presents with  . Cough    HPI Connor Damiean Mitsch Sr. is a 74 y.o. male.   Patient reports to urgent care today for  7 days of runny nose and congestion. He reports a slight increase in his baseline COPD cough. Denies increase in sputum production and states it is clear. He endorses his nose is "stuffed up" but it is clear discharge. Denies headache or facial pain. Denies increase in his baseline shortness of breath and states he is only having and difficulty because he cant breath through his nose. He denies nausea, vomiting, diarrhea. Denies recent sick contacts. He denies to this provider fever or chills. Denies chest pain.   He has tried numerous over the counter medications with no relief of nasal congestion. To include nasal saline sprays and oral decongestants.      Past Medical History:  Diagnosis Date  . Anxiety    pt. denies  . Arthritis    and back problems  . Cancer [facility])    Bladder Ca 2013, surgically removed  . COPD (chronic obstructive pulmonary disease) ([place])   . History of kidney stones    once  2013 had surgery  . Hypertension   . Shortness of breath    with exertion    Patient Active Problem List   Diagnosis Date Noted  . Sepsis ([place]) 02/15/2019  . AKI (acute kidney injury) ([place]) 02/15/2019  . Hyponatremia 02/15/2019  . Metabolic acidosis [id number]  . Pyelonephritis 02/14/2019  . Metatarsalgia of both feet 11/15/2017  . Asthma 10/15/2017  . Anxiety 10/01/2017  . Rhinitis 08/14/2017  . BLADDER CANCER 10/02/2007  . Hyperlipidemia 10/02/2007  . ALCOHOL ABUSE 10/02/2007  . EROSIVE ESOPHAGITIS 10/02/2007  . ESOPHAGEAL STENOSIS 10/02/2007  . HIATAL HERNIA 10/02/2007  . FATIGUE 10/02/2007  . Essential hypertension 12/26/2006  . GERD 12/26/2006  . HEADACHE 12/26/2006    Past Surgical History:  Procedure  Laterality Date  . BACK SURGERY     lower  . cancer removed     x 2  . EYE SURGERY     bil cataracts  . TRANSURETHRAL RESECTION OF BLADDER TUMOR N/A 11/16/2017   Procedure: TRANSURETHRAL RESECTION OF BLADDER TUMOR (TURBT);  Surgeon: Lucas Mallow, MD;  Location: [facility];  Service: Urology;  Laterality: N/A;  . TRANSURETHRAL RESECTION OF BLADDER TUMOR WITH MITOMYCIN-C N/A 02/02/2019   Procedure: TRANSURETHRAL RESECTION OF BLADDER TUMOR WITH GEMCITABINE;  Surgeon: Lucas Mallow, MD;  Location: [facility];  Service: Urology;  Laterality: N/A;       Home Medications    Prior to Admission medications   Medication Sig Start Date End Date Taking? Authorizing Provider  albuterol (PROVENTIL) (2.5 MG/3ML) 0.083% nebulizer solution Take 3 mLs (2.5 mg total) by nebulization every 6 (six) hours as needed for wheezing or shortness of breath. 10/15/17   Rigoberto Noel, MD  albuterol (VENTOLIN HFA) 108 (90 Base) MCG/ACT inhaler Inhale 2 puffs into the lungs every 6 (six) hours as needed for wheezing or shortness of breath. 02/23/19   Parrett, Fonnie Mu, NP  amLODipine (NORVASC) 10 MG tablet TAKE 1 TABLET(10 MG) BY MOUTH DAILY Patient taking differently: Take 10 mg by mouth daily.  01/13/19   Nafziger, Tommi Rumps, NP  atorvastatin (LIPITOR) 20 MG tablet TAKE 1 TABLET BY MOUTH BEFORE BEDTIME TO REDUCE  CHOLESTEROL Patient taking differently: Take 20 mg by mouth at bedtime.  10/18/18   Nafziger, Tommi Rumps, NP  fluticasone furoate-vilanterol (BREO ELLIPTA) 100-25 MCG/INH AEPB INHALE 1 PUFF INTO THE LUNGS DAILY 02/23/19   Parrett, Fonnie Mu, NP  gabapentin (NEURONTIN) 300 MG capsule TAKE 1 CAPSULE(300 MG) BY MOUTH TWICE DAILY Patient taking differently: Take 300 mg by mouth 2 (two) times daily as needed (pain).  12/08/18   Nafziger, Tommi Rumps, NP  ipratropium-albuterol (DUONEB) 0.5-2.5 (3) MG/3ML SOLN Take 3 mLs by nebulization every 6 (six) hours as needed. For shortness of breath 04/07/17   Rigoberto Noel, MD  losartan (COZAAR) 100  MG tablet TAKE 1 TABLET BY MOUTH DAILY Patient taking differently: Take 100 mg by mouth daily.  01/13/19   Nafziger, Tommi Rumps, NP  oxymetazoline (AFRIN NASAL SPRAY) 0.05 % nasal spray Place 1 spray into both nostrils 2 (two) times daily for 3 days. 06/26/19 06/29/19  Marcy Sookdeo, Marguerita Beards, PA-C  predniSONE (DELTASONE) 10 MG tablet 2 tabs daily for 3 days 05/26/19   Parrett, Fonnie Mu, NP  sodium chloride (OCEAN) 0.65 % SOLN nasal spray Place 1 spray into both nostrils as needed for congestion. 06/26/19   Larhonda Dettloff, Marguerita Beards, PA-C  tamsulosin (FLOMAX) 0.4 MG CAPS capsule Take 0.4 mg by mouth daily.  10/06/17   Kathie Rhodes, MD    Family History Family History  Problem Relation Age of Onset  . Cancer Brother   . Cancer Father   . Cancer Sister     Social History Social History   Tobacco Use  . Smoking status: Former Smoker    Packs/day: 1.00    Years: 50.00    Pack years: 50.00    Types: Cigarettes    Quit date: 09/21/2013    Years since quitting: 5.7  . Smokeless tobacco: Never Used  Substance Use Topics  . Alcohol use: No  . Drug use: No     Allergies   Patient has no known allergies.   Review of Systems Review of Systems  Constitutional: Negative for chills, fatigue and fever.  HENT: Positive for congestion, postnasal drip, rhinorrhea and sinus pressure. Negative for ear pain, sneezing and sore throat.   Eyes: Negative for pain, itching and visual disturbance.  Respiratory: Positive for cough. Negative for shortness of breath.   Cardiovascular: Negative for chest pain and palpitations.  Gastrointestinal: Negative for abdominal pain, diarrhea, nausea and vomiting.  Genitourinary: Negative for dysuria.  Musculoskeletal: Negative for arthralgias, back pain and myalgias.  Skin: Negative for color change and rash.  Neurological: Negative for seizures, syncope and headaches.  Hematological: Negative for adenopathy.  All other systems reviewed and are negative.    Physical Exam Triage Vital  Signs ED Triage Vitals  Enc Vitals Group     BP 06/26/19 1825 (!) 146/83     Pulse Rate 06/26/19 1825 99     Resp 06/26/19 1825 16     Temp 06/26/19 1825 98.4 F (36.9 C)     Temp Source 06/26/19 1825 Oral     SpO2 06/26/19 1825 98 %     Weight 06/26/19 1826 158 lb (71.7 kg)     Height --      Head Circumference --      Peak Flow --      Pain Score 06/26/19 1826 8     Pain Loc --      Pain Edu? --      Excl. in [place]? --    No data found.  Updated Vital Signs BP (!) 146/83 (BP Location: Right Arm)   Pulse 99   Temp 98.4 F (36.9 C) (Oral)   Resp 16   Wt 158 lb (71.7 kg)   SpO2 98%   BMI 24.75 kg/m   Visual Acuity Right Eye Distance:   Left Eye Distance:   Bilateral Distance:    Right Eye Near:   Left Eye Near:    Bilateral Near:     Physical Exam Vitals and nursing note reviewed.  Constitutional:      Appearance: He is well-developed.  HENT:     Head: Normocephalic and atraumatic.     Comments: No maxillary or frontal TTP     Nose: Congestion (clear discharge) and rhinorrhea present.     Mouth/Throat:     Mouth: Mucous membranes are moist.     Pharynx: Oropharynx is clear.  Eyes:     General: No scleral icterus.    Conjunctiva/sclera: Conjunctivae normal.     Pupils: Pupils are equal, round, and reactive to light.  Cardiovascular:     Rate and Rhythm: Normal rate and regular rhythm.     Pulses: Normal pulses.     Heart sounds: Normal heart sounds.  Pulmonary:     Effort: Pulmonary effort is normal. No respiratory distress.     Breath sounds: Normal breath sounds. No wheezing, rhonchi or rales.  Musculoskeletal:     Cervical back: Normal range of motion and neck supple.     Right lower leg: No edema.     Left lower leg: No edema.  Lymphadenopathy:     Cervical: No cervical adenopathy.  Skin:    General: Skin is warm and dry.     Capillary Refill: Capillary refill takes less than 2 seconds.  Neurological:     General: No focal deficit present.      Mental Status: He is alert and oriented to person, place, and time.  Psychiatric:        Mood and Affect: Mood normal.        Behavior: Behavior normal.        Thought Content: Thought content normal.        Judgment: Judgment normal.      UC Treatments / Results  Labs (all labs ordered are listed, but only abnormal results are displayed) Labs Reviewed  NOVEL CORONAVIRUS, NAA (HOSP ORDER, SEND-OUT TO REF LAB; TAT 18-24 HRS)    EKG   Radiology No results found.  Procedures Procedures (including critical care time)  Medications Ordered in UC Medications - No data to display  Initial Impression / Assessment and Plan / UC Course  I have reviewed the triage vital signs and the nursing notes.  Pertinent labs & imaging results that were available during my care of the patient were reviewed by me and considered in my medical decision making (see chart for details).    #Viral URI - Covid PCR sent. Discussed at length with patient about a maximum of 3 day trial of afrin and continuing nasal saline to aid in congestion. Discussed that I do not think this is a bacterial sinus infection and he should have improvement in a week. He agrees to try this plan.   Precautions discussed.  Final Clinical Impressions(s) / UC Diagnoses   Final diagnoses:  Upper respiratory tract infection, unspecified type     Discharge Instructions     Use the afrin 2 times a day for 3 days only.  Use the nasal saline throughout  the day.   Take tylenol 325mg  tablet x 2 up to every 6 hours for sore throat, fever and body aches. Do not exceed 8 tablets in 24 hours  Go directly to the Emergency Department or call 911 if you have severe chest pain, shortness of breath, severe diarrhea or feel as though you might pass out.    If your Covid-19 test is positive, you will receive a phone call from [facility] regarding your results. Negative test results are not called. Both positive and negative results  area always visible on MyChart. If you do not have a MyChart account, sign up instructions are in your discharge papers.   Persons who are directed to care for themselves at home may discontinue isolation under the following conditions:  . At least 10 days have passed since symptom onset and . At least 24 hours have passed without running a fever (this means without the use of fever-reducing medications) and . Other symptoms have improved.  Persons infected with COVID-19 who never develop symptoms may discontinue isolation and other precautions 10 days after the date of their first positive COVID-19 test.     ED Prescriptions    Medication Sig Dispense Auth. Provider   oxymetazoline (AFRIN NASAL SPRAY) 0.05 % nasal spray Place 1 spray into both nostrils 2 (two) times daily for 3 days. 20 mL Humza Tallerico, Marguerita Beards, PA-C   sodium chloride (OCEAN) 0.65 % SOLN nasal spray Place 1 spray into both nostrils as needed for congestion. 30 mL Mao Lockner, Marguerita Beards, PA-C     PDMP not reviewed this encounter.   Purnell Shoemaker, PA-C 06/26/19 1945

## 2019-06-26 NOTE — ED Triage Notes (Signed)
Pt states he has a runny nose,cough, headache and fever off and on for a week now pt states he thinks its a head cold. x 1 week.

## 2019-06-29 LAB — NOVEL CORONAVIRUS, NAA (HOSP ORDER, SEND-OUT TO REF LAB; TAT 18-24 HRS): SARS-CoV-2, NAA: NOT DETECTED

## 2019-07-07 ENCOUNTER — Other Ambulatory Visit: Payer: Self-pay

## 2019-07-07 ENCOUNTER — Encounter: Payer: Self-pay | Admitting: Pulmonary Disease

## 2019-07-07 ENCOUNTER — Ambulatory Visit: Payer: Medicare Other | Admitting: Pulmonary Disease

## 2019-07-07 DIAGNOSIS — J0100 Acute maxillary sinusitis, unspecified: Secondary | ICD-10-CM

## 2019-07-07 DIAGNOSIS — J4530 Mild persistent asthma, uncomplicated: Secondary | ICD-10-CM

## 2019-07-07 MED ORDER — AMOXICILLIN 500 MG PO TABS: 500.0000 mg | ORAL_TABLET | Freq: Three times a day (TID) | ORAL | 0 refills | Status: DC

## 2019-07-07 NOTE — Progress Notes (Signed)
   Subjective:    Patient ID: Connor Pontiff Sr., male    DOB: 1946/05/09, 74 y.o.   MRN: [id number]  HPI  74 yo ex-smoker Presents for FU of COPD/asthma. He smoked a pack a day since the age of 15-about 17 pack years  Reversibility of FEV1 suggesting larger component of asthma rather than COPD  PMH - bladder cancer status post transurethral resection of the bladder tumor and intravesicular instillation of chemotherapy 01/2019, hypertension, chronic kidney disease stage [name]  In 2019 we changed him to [facility], this is worked well for him. Televisit 05/2019 reviewed-sinusitis, given prednisone  UC visit 1/17, covid neg , complains of persistent nasal discharge, occasionally yellow Sinus pain, nasal congestion and change in voice No change in surroundings, no prior allergies  CT sinuses 07/2017 reviewed clear  Significant tests/ events reviewed  CT chest angio 02/2019 neg PE Spirometry 11/2013  FEV1 43% - 1.31 - with ratio 53 Post bronchodilator, FEV1 improved to 1.56 -significant bronchodilator response.   Arlyce Harman 10/2014 FEV 1 69%   08/2017 Spirometry- FEV1 at 82%, ratio 63, FVC 95% IgE 216, RAST neg  Review of Systems neg for any significant sore throat, dysphagia, itching, sneezing,  fever, chills, sweats, unintended wt loss, pleuritic or exertional cp, hempoptysis, orthopnea pnd or change in chronic leg swelling. Also denies presyncope, palpitations, heartburn, abdominal pain, nausea, vomiting, diarrhea or change in bowel or urinary habits, dysuria,hematuria, rash, arthralgias, visual complaints, headache, numbness weakness or ataxia.     Objective:   Physical Exam  Gen. Pleasant, well-nourished, in no distress ENT - no thrush, no pallor/icterus,no post nasal drip, nasal twang, no tenderness over right maxillary sinus Neck: No JVD, no thyromegaly, no carotid bruits Lungs: no use of accessory muscles, no dullness to percussion, clear without rales or rhonchi  Cardiovascular:  Rhythm regular, heart sounds  normal, no murmurs or gallops, no peripheral edema Musculoskeletal: No deformities, no cyanosis or clubbing        Assessment & Plan:

## 2019-07-07 NOTE — Assessment & Plan Note (Signed)
Ongoing for 2 months-we will treat as acute bacterial sinusitis, no prior allergic diathesis. CT sinuses 07/2017 was clear  Amoxicillin 500 mg 3 times daily for 7 days Take over-the-counter decongestant-walphed 10 mg daily for 7 days  Flonase nasal spray-each nare at bedtime  Call if no better in 10 days we can refer you to ENT

## 2019-07-07 NOTE — Assessment & Plan Note (Signed)
-  Flaring up due to URI issues Continue Breo, does not need more prednisone

## 2019-07-07 NOTE — Patient Instructions (Signed)
Amoxicillin 500 mg 3 times daily for 7 days Take over-the-counter decongestant-walphed 10 mg daily for 7 days  Flonase nasal spray-each nare at bedtime  Call if no better in 10 days we can refer you to ENT

## 2019-07-13 ENCOUNTER — Other Ambulatory Visit: Payer: Self-pay | Admitting: Adult Health

## 2019-07-13 DIAGNOSIS — F419 Anxiety disorder, unspecified: Secondary | ICD-10-CM

## 2019-07-25 IMAGING — CT CT PARANASAL SINUSES LIMITED
1 of 2 series · 8 of 11 positions shown, 10 images · non-contrast
Comparison: None.

CLINICAL DATA: Postnasal drip. Sinus congestion. Symptoms since
[REDACTED].

EXAM:
CT PARANASAL SINUS LIMITED WITHOUT CONTRAST
TECHNIQUE: Non-contiguous multidetector CT images of the paranasal sinuses were
obtained in a single plane without contrast.

[Series 4: limited sinus st · axial · 0.23mm/px · z∈[+113,+183]mm · 8 of 10 slices shown, 10 images]
[im 2/10  brain]
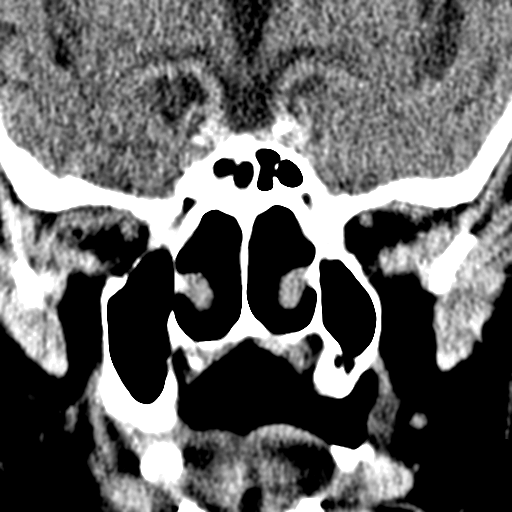
[im 2/10  bone]
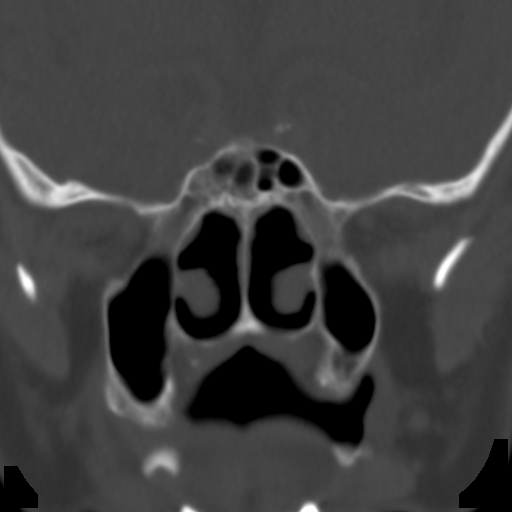
[im 3/10  bone]
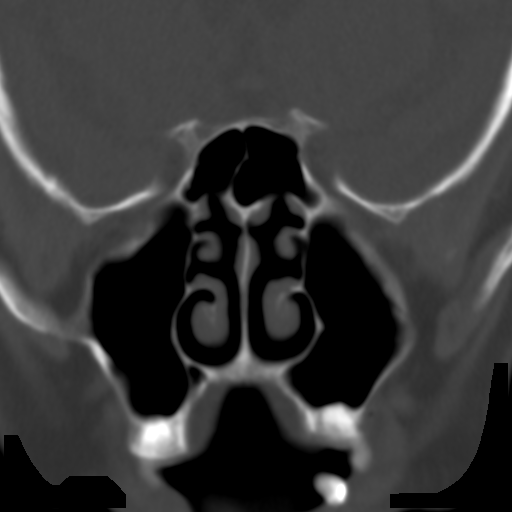
[im 4/10  bone]
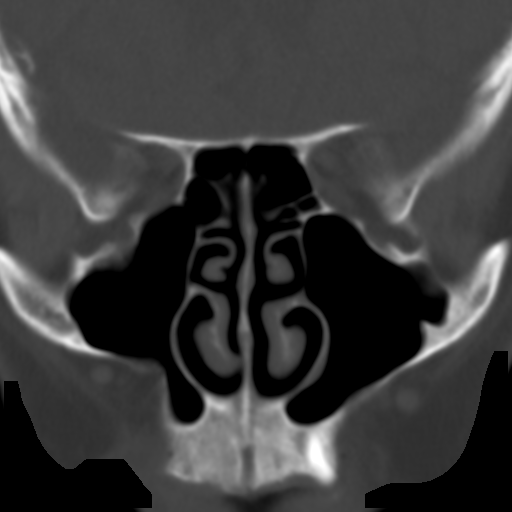
[im 5/10  bone]
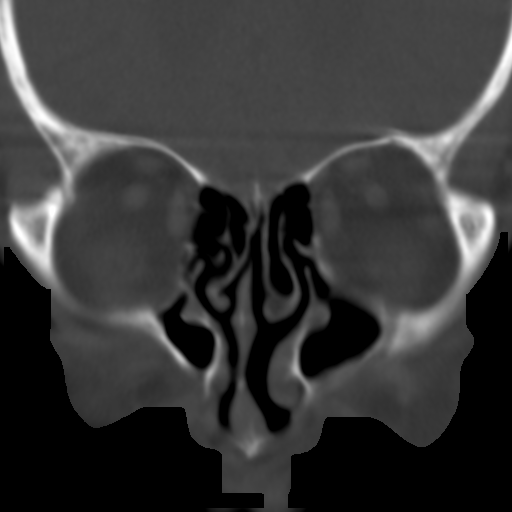
[im 6/10  brain]
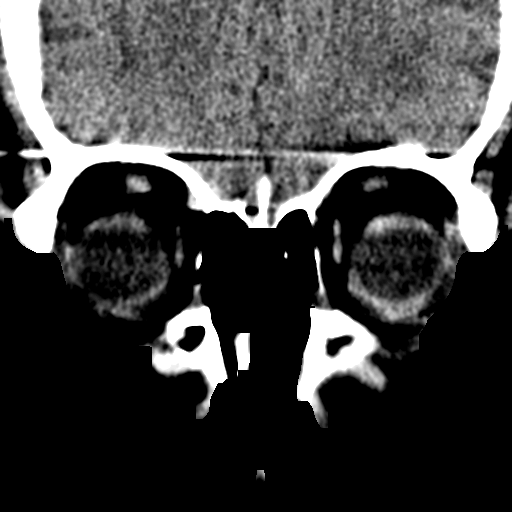
[im 6/10  bone]
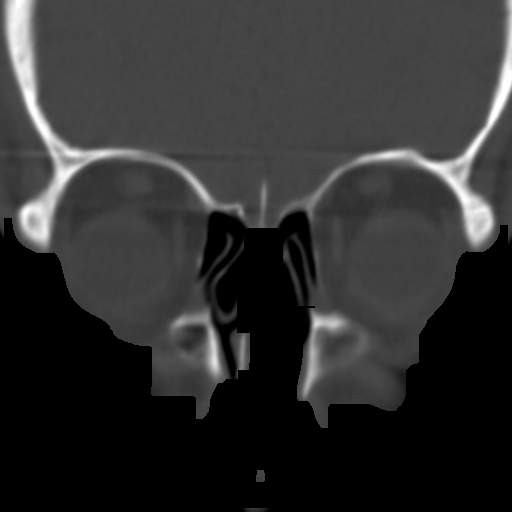
[im 7/10  bone]
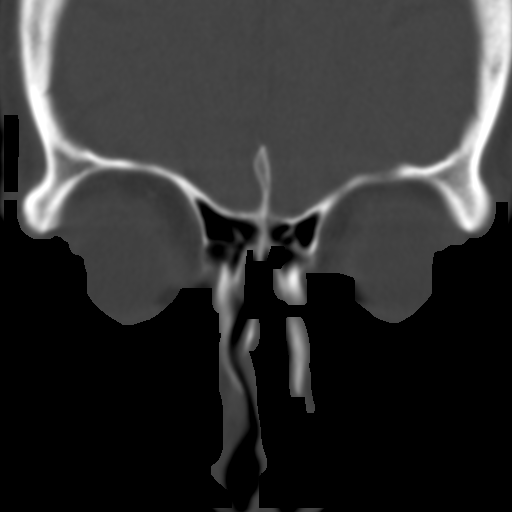
[im 8/10  bone]
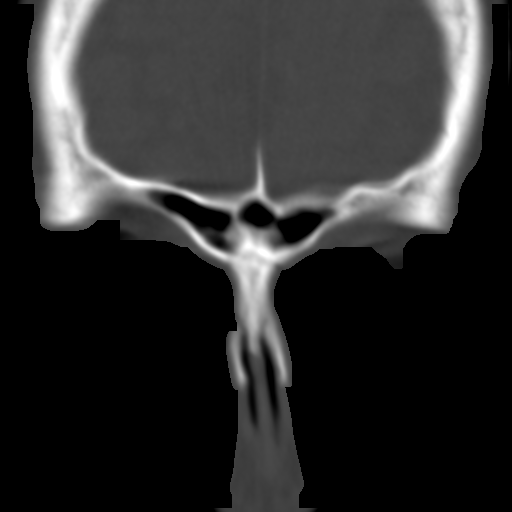
[im 9/10  bone]
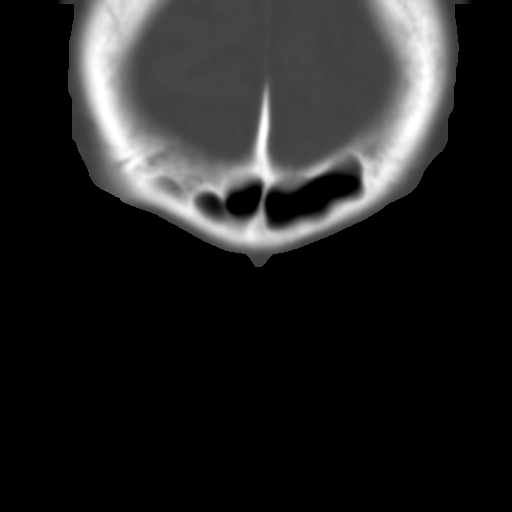

[8 of 11 positions shown; findings below may reference images not displayed]

FINDINGS: The visualized portions of the paranasal sinuses are clear without
evidence of significant mucosal thickening or fluid. Mild leftward
nasal septal deviation is noted. The included regional soft tissues
are grossly unremarkable.
IMPRESSION: Clear sinuses.

## 2019-08-08 ENCOUNTER — Telehealth: Payer: Self-pay | Admitting: Pulmonary Disease

## 2019-08-08 DIAGNOSIS — J0100 Acute maxillary sinusitis, unspecified: Secondary | ICD-10-CM

## 2019-08-08 NOTE — Telephone Encounter (Signed)
Sorry to hear that  Can refer to ENT per Dr. Elsworth Soho  Note

## 2019-08-08 NOTE — Telephone Encounter (Signed)
ENT referral placed for Patient. Called and spoke with [name], Patient's Wife. Patient and [name] are aware of ENT referral.  Nothing further at this time.

## 2019-08-08 NOTE — Telephone Encounter (Signed)
Called and spoke with Patient's Wife, [name].  [name] stated Patient is still having nasal congestion, sneezing,and constantly blowing nose.  [name] stated Patient has tried using saline, decongestants with no relief. [name] stated Dr. Elsworth Soho spoke about possible referral to ENT.  Message routed to [facility], NP for ENT referral

## 2019-09-19 ENCOUNTER — Other Ambulatory Visit: Payer: Self-pay | Admitting: Otolaryngology

## 2019-10-03 NOTE — Progress Notes (Signed)
[place], [place] [address] Phone: [phone] Fax: [place], [place] [address] Phone: [phone] Fax: [phone]  [facility] [place], [place] [address] Phone: [phone] Fax: [phone]      Your procedure is scheduled on Friday April 30  Report to [facility] Main Entrance "A" at 0830 A.M., and check in at the Admitting office.  Call this number if you have problems the morning of surgery:  [phone]  Call [phone] if you have any questions prior to your surgery date Monday-Friday 8am-4pm    Remember:  Do not eat or drink after midnight the night before your surgery     Take these medicines the morning of surgery with A SIP OF WATER  albuterol (PROVENTIL) (2.5 MG/3ML) if needed albuterol (VENTOLIN HFA) if needed. Please bring all inhalers with you the day of surgery.  amLODipine (NORVASC) fluticasone furoate-vilanterol (BREO ELLIPTA) gabapentin (NEURONTIN)  As of today, STOP taking any Aspirin (unless otherwise instructed by your surgeon) and Aspirin containing products, Aleve, Naproxen, Ibuprofen, Motrin, Advil, Goody's, BC's, all herbal medications, fish oil, and all vitamins.                      Do not wear jewelry            Do not wear lotions, powders, colognes, or deodorant.            Men may shave face and neck.            Do not bring valuables to the hospital.            [facility] is not responsible for any belongings or valuables.  Do NOT Smoke (Tobacco/Vapping) or drink Alcohol 24 hours prior to your procedure If you use a CPAP at night, you may bring all equipment for your overnight stay.   Contacts, glasses, dentures or bridgework may not  be worn into surgery.      For patients admitted to the hospital, discharge time will be determined by your treatment team.   Patients discharged the day of surgery will not be allowed to drive home, and someone needs to stay with them for 24 hours.    Special instructions:   - Preparing For Surgery  Before surgery, you can play an important role. Because skin is not sterile, your skin needs to be as free of germs as possible. You can reduce the number of germs on your skin by washing with CHG (chlorahexidine gluconate) Soap before surgery.  CHG is an antiseptic cleaner which kills germs and bonds with the skin to continue killing germs even after washing.    Oral Hygiene is also important to reduce your risk of infection.  Remember - BRUSH YOUR TEETH THE MORNING OF SURGERY WITH YOUR REGULAR TOOTHPASTE  Please do not use if you have an allergy to CHG or antibacterial soaps. If your skin becomes reddened/irritated stop using the CHG.  Do not shave (including legs and underarms) for at least 48 hours prior to first CHG shower. It is OK to shave your face.  Please follow these instructions carefully.   1. Shower the [employer] BEFORE SURGERY  and the MORNING OF SURGERY with CHG Soap.   2. If you chose to wash your hair, wash your hair first as usual with your normal shampoo.  3. After you shampoo, rinse your hair and body thoroughly to remove the shampoo.  4. Use CHG as you would any other liquid soap. You can apply CHG directly to the skin and wash gently with a scrungie or a clean washcloth.   5. Apply the CHG Soap to your body ONLY FROM THE NECK DOWN.  Do not use on open wounds or open sores. Avoid contact with your eyes, ears, mouth and genitals (private parts). Wash Face and genitals (private parts)  with your normal soap.   6. Wash thoroughly, paying special attention to the area where your surgery will be performed.  7. Thoroughly rinse your body with warm water from the neck  down.  8. DO NOT shower/wash with your normal soap after using and rinsing off the CHG Soap.  9. Pat yourself dry with a CLEAN TOWEL.  10. Wear CLEAN PAJAMAS to bed the night before surgery, wear comfortable clothes the morning of surgery  11. Place CLEAN SHEETS on your bed the night of your first shower and DO NOT SLEEP WITH PETS.   Day of Surgery:   Do not apply any deodorants/lotions.  Please wear clean clothes to the hospital/surgery center.   Remember to brush your teeth WITH YOUR REGULAR TOOTHPASTE.   Please read over the following fact sheets that you were given.

## 2019-10-04 ENCOUNTER — Encounter (HOSPITAL_COMMUNITY)
Admission: RE | Admit: 2019-10-04 | Discharge: 2019-10-04 | Disposition: A | Discharge status: Home / Self Care | Payer: Medicare Other | Source: Ambulatory Visit | Attending: Otolaryngology | Admitting: Otolaryngology

## 2019-10-04 ENCOUNTER — Other Ambulatory Visit (HOSPITAL_COMMUNITY)
Admission: RE | Admit: 2019-10-04 | Discharge: 2019-10-04 | Disposition: A | Discharge status: Home / Self Care | Payer: Medicare Other | Source: Ambulatory Visit | Attending: Otolaryngology | Admitting: Otolaryngology

## 2019-10-04 ENCOUNTER — Encounter (HOSPITAL_COMMUNITY): Payer: Self-pay

## 2019-10-04 ENCOUNTER — Other Ambulatory Visit: Payer: Self-pay

## 2019-10-04 DIAGNOSIS — Z01812 Encounter for preprocedural laboratory examination: Secondary | ICD-10-CM | POA: Insufficient documentation

## 2019-10-04 DIAGNOSIS — Z20822 Contact with and (suspected) exposure to covid-19: Secondary | ICD-10-CM | POA: Insufficient documentation

## 2019-10-04 LAB — CBC
HCT: 45.1 % (ref 39.0–52.0)
Hemoglobin: 14.4 g/dL (ref 13.0–17.0)
MCH: 29.1 pg (ref 26.0–34.0)
MCHC: 31.9 g/dL (ref 30.0–36.0)
MCV: 91.1 fL (ref 80.0–100.0)
Platelets: 474 10*3/uL — ABNORMAL HIGH (ref 150–400)
RBC: 4.95 MIL/uL (ref 4.22–5.81)
RDW: 15.8 % — ABNORMAL HIGH (ref 11.5–15.5)
WBC: 10.5 10*3/uL (ref 4.0–10.5)
nRBC: 0 % (ref 0.0–0.2)

## 2019-10-04 LAB — BASIC METABOLIC PANEL
Anion gap: 10 (ref 5–15)
BUN: 15 mg/dL (ref 8–23)
CO2: 26 mmol/L (ref 22–32)
Calcium: 9.4 mg/dL (ref 8.9–10.3)
Chloride: 104 mmol/L (ref 98–111)
Creatinine, Ser: 1.36 mg/dL — ABNORMAL HIGH (ref 0.61–1.24)
GFR calc Af Amer: 59 mL/min — ABNORMAL LOW (ref >60)
GFR calc non Af Amer: 51 mL/min — ABNORMAL LOW (ref >60)
Glucose, Bld: 99 mg/dL (ref 70–99)
Potassium: 4 mmol/L (ref 3.5–5.1)
Sodium: 140 mmol/L (ref 135–145)

## 2019-10-04 LAB — SARS CORONAVIRUS 2 (TAT 6-24 HRS): SARS Coronavirus 2: NEGATIVE

## 2019-10-04 NOTE — Progress Notes (Signed)
PCP - Dr. Dorothyann Peng Pulmonologist: Dr. Kara Mead Cardiologist - Denies  PPM/ICD - Denies  Chest x-ray - N/A EKG - 02/21/19 Stress Test - Denies ECHO - 2008 Cardiac Cath - Denies  Sleep Study - Denies  Pt denies being diabetic.   Blood Thinner Instructions: N/A Aspirin Instructions: N/A  ERAS Protcol - No  COVID TEST- 10/04/19   Coronavirus Screening  Have you experienced the following symptoms:  Cough yes/no: No Fever (>100.36F)  yes/no: No Runny nose yes/no: No Sore throat yes/no: No Difficulty breathing/shortness of breath  yes/no: No  Have you or a family member traveled in the last 14 days and where? yes/no: No   If the patient indicates "YES" to the above questions, their PAT will be rescheduled to limit the exposure to others and, the surgeon will be notified. THE PATIENT WILL NEED TO BE ASYMPTOMATIC FOR 14 DAYS.   If the patient is not experiencing any of these symptoms, the PAT nurse will instruct them to NOT bring anyone with them to their appointment since they may have these symptoms or traveled as well.   Please remind your patients and families that hospital visitation restrictions are in effect and the importance of the restrictions.     Anesthesia review: Yes, previous abnormal EKGs  Patient denies shortness of breath, fever, cough and chest pain at PAT appointment   All instructions explained to the patient, with a verbal understanding of the material. Patient agrees to go over the instructions while at home for a better understanding. Patient also instructed to self quarantine after being tested for COVID-19. The opportunity to ask questions was provided.

## 2019-10-05 NOTE — Anesthesia Preprocedure Evaluation (Addendum)
Anesthesia Evaluation  Patient identified by MRN, date of birth, ID band Patient awake    Reviewed: Allergy & Precautions, NPO status , Patient's Chart, lab work & pertinent test results  Airway Mallampati: [name]       Dental  (+) Missing, Dental Advisory Given   Pulmonary shortness of breath and with exertion, asthma , COPD,  COPD inhaler, former smoker,    Pulmonary exam normal        Cardiovascular hypertension, Pt. on medications Normal cardiovascular exam  ECG: ST, rate 108   Neuro/Psych  Headaches, PSYCHIATRIC DISORDERS Anxiety    GI/Hepatic negative GI ROS, (+)     substance abuse  ,   Endo/Other  negative endocrine ROS  Renal/GU negative Renal ROS     Musculoskeletal  (+) Arthritis ,   Abdominal   Peds  Hematology HLD   Anesthesia Other Findings DEVIATED SEPTUM, TURBINATE HYPERTROPHY, RECURRENT MAXILLARY SINUSITIS  Reproductive/Obstetrics                          Anesthesia Physical Anesthesia Plan  ASA: [name]  Anesthesia Plan: General   Post-op Pain Management:    Induction: Intravenous  PONV Risk Score and Plan: 2 and Ondansetron, Dexamethasone, Midazolam and Treatment may vary due to age or medical condition  Airway Management Planned: Oral ETT  Additional Equipment:   Intra-op Plan:   Post-operative Plan: Extubation in OR  Informed Consent: I have reviewed the patients History and Physical, chart, labs and discussed the procedure including the risks, benefits and alternatives for the proposed anesthesia with the patient or authorized representative who has indicated his/her understanding and acceptance.     Dental advisory given  Plan Discussed with: CRNA  Anesthesia Plan Comments: (Per PA-C: Follows with pulmonology for COPD/asthma. Last seen by Dr. Elsworth Soho 07/07/19. Per note, "Reversibility of FEV1 suggesting larger component of asthma rather than  COPD."  Spirometry 08/14/17: FVC Pred Pre 95% FEV1 Pred Pre 82% FEV1/FVC Pred Pre 86%  )      Anesthesia Quick Evaluation

## 2019-10-07 ENCOUNTER — Encounter (HOSPITAL_COMMUNITY)
Admission: RE | Disposition: A | Discharge status: Home / Self Care | Payer: Self-pay | Source: Home / Self Care | Attending: Otolaryngology

## 2019-10-07 ENCOUNTER — Ambulatory Visit (HOSPITAL_COMMUNITY): Payer: Medicare Other | Admitting: Anesthesiology

## 2019-10-07 ENCOUNTER — Ambulatory Visit (HOSPITAL_COMMUNITY): Payer: Medicare Other | Admitting: Physician Assistant

## 2019-10-07 ENCOUNTER — Ambulatory Visit (HOSPITAL_COMMUNITY)
Admission: RE | Admit: 2019-10-07 | Discharge: 2019-10-07 | Disposition: A | Discharge status: Home / Self Care | Payer: Medicare Other | Attending: Otolaryngology | Admitting: Otolaryngology

## 2019-10-07 ENCOUNTER — Other Ambulatory Visit: Payer: Self-pay

## 2019-10-07 ENCOUNTER — Encounter (HOSPITAL_COMMUNITY): Payer: Self-pay | Admitting: Otolaryngology

## 2019-10-07 DIAGNOSIS — I10 Essential (primary) hypertension: Secondary | ICD-10-CM | POA: Insufficient documentation

## 2019-10-07 DIAGNOSIS — J343 Hypertrophy of nasal turbinates: Secondary | ICD-10-CM | POA: Diagnosis not present

## 2019-10-07 DIAGNOSIS — J342 Deviated nasal septum: Secondary | ICD-10-CM | POA: Diagnosis not present

## 2019-10-07 DIAGNOSIS — M1990 Unspecified osteoarthritis, unspecified site: Secondary | ICD-10-CM | POA: Diagnosis not present

## 2019-10-07 DIAGNOSIS — Z87891 Personal history of nicotine dependence: Secondary | ICD-10-CM | POA: Diagnosis not present

## 2019-10-07 DIAGNOSIS — Z79899 Other long term (current) drug therapy: Secondary | ICD-10-CM | POA: Diagnosis not present

## 2019-10-07 DIAGNOSIS — J329 Chronic sinusitis, unspecified: Secondary | ICD-10-CM | POA: Diagnosis present

## 2019-10-07 DIAGNOSIS — J449 Chronic obstructive pulmonary disease, unspecified: Secondary | ICD-10-CM | POA: Diagnosis not present

## 2019-10-07 DIAGNOSIS — Z7951 Long term (current) use of inhaled steroids: Secondary | ICD-10-CM | POA: Diagnosis not present

## 2019-10-07 HISTORY — PX: NASAL SEPTOPLASTY W/ TURBINOPLASTY: SHX2070

## 2019-10-07 HISTORY — PX: SINUS ENDO WITH FUSION: SHX5329

## 2019-10-07 SURGERY — SURGERY, PARANASAL SINUS, ENDOSCOPIC, WITH NASAL SEPTOPLASTY, TURBINOPLASTY, AND MAXILLARY SINUSOTOMY
Anesthesia: General | Site: Nose | Laterality: Bilateral

## 2019-10-07 MED ORDER — LIDOCAINE 2% (20 MG/ML) 5 ML SYRINGE
INTRAMUSCULAR | Status: AC
  Filled 2019-10-07: qty 5

## 2019-10-07 MED ORDER — MIDAZOLAM HCL 5 MG/5ML IJ SOLN
INTRAMUSCULAR | Status: DC | PRN
  Administered 2019-10-07: 1 mg via INTRAVENOUS

## 2019-10-07 MED ORDER — TRIAMCINOLONE ACETONIDE 40 MG/ML IJ SUSP
INTRAMUSCULAR | Status: AC
  Filled 2019-10-07: qty 5

## 2019-10-07 MED ORDER — ONDANSETRON HCL 4 MG/2ML IJ SOLN
INTRAMUSCULAR | Status: AC
  Filled 2019-10-07: qty 2

## 2019-10-07 MED ORDER — SUGAMMADEX SODIUM 200 MG/2ML IV SOLN
INTRAVENOUS | Status: DC | PRN
  Administered 2019-10-07: 200 mg via INTRAVENOUS

## 2019-10-07 MED ORDER — MUPIROCIN 2 % EX OINT
TOPICAL_OINTMENT | CUTANEOUS | Status: DC | PRN
  Administered 2019-10-07: 1 via NASAL

## 2019-10-07 MED ORDER — CEFAZOLIN SODIUM-DEXTROSE 2-4 GM/100ML-% IV SOLN
2.0000 g | INTRAVENOUS | Status: AC
  Administered 2019-10-07: 2 g via INTRAVENOUS
  Filled 2019-10-07: qty 100

## 2019-10-07 MED ORDER — MUPIROCIN CALCIUM 2 % EX CREA
TOPICAL_CREAM | CUTANEOUS | Status: AC
  Filled 2019-10-07: qty 15

## 2019-10-07 MED ORDER — FENTANYL CITRATE (PF) 250 MCG/5ML IJ SOLN
INTRAMUSCULAR | Status: AC
  Filled 2019-10-07: qty 5

## 2019-10-07 MED ORDER — ONDANSETRON HCL 4 MG/2ML IJ SOLN: 4.0000 mg | Freq: Once | INTRAMUSCULAR | Status: DC | PRN

## 2019-10-07 MED ORDER — DEXAMETHASONE SODIUM PHOSPHATE 10 MG/ML IJ SOLN
INTRAMUSCULAR | Status: DC | PRN
  Administered 2019-10-07: 5 mg via INTRAVENOUS

## 2019-10-07 MED ORDER — LACTATED RINGERS IV SOLN: INTRAVENOUS | Status: DC | PRN

## 2019-10-07 MED ORDER — OXYMETAZOLINE HCL 0.05 % NA SOLN
NASAL | Status: DC | PRN
  Administered 2019-10-07 (×3): 2 via NASAL

## 2019-10-07 MED ORDER — PROPOFOL 10 MG/ML IV BOLUS
INTRAVENOUS | Status: AC
  Filled 2019-10-07: qty 20

## 2019-10-07 MED ORDER — ARTIFICIAL TEARS OPHTHALMIC OINT
TOPICAL_OINTMENT | OPHTHALMIC | Status: DC | PRN
Start: 2019-10-07 — End: 2019-10-07
  Administered 2019-10-07: 1 via OPHTHALMIC

## 2019-10-07 MED ORDER — CEPHALEXIN 500 MG PO CAPS: 500.0000 mg | ORAL_CAPSULE | Freq: Three times a day (TID) | ORAL | 0 refills | Status: AC

## 2019-10-07 MED ORDER — MUPIROCIN 2 % EX OINT
TOPICAL_OINTMENT | CUTANEOUS | Status: AC
  Filled 2019-10-07: qty 22

## 2019-10-07 MED ORDER — ROCURONIUM BROMIDE 10 MG/ML (PF) SYRINGE
PREFILLED_SYRINGE | INTRAVENOUS | Status: AC
  Filled 2019-10-07: qty 10

## 2019-10-07 MED ORDER — FENTANYL CITRATE (PF) 100 MCG/2ML IJ SOLN: 25.0000 ug | INTRAMUSCULAR | Status: DC | PRN

## 2019-10-07 MED ORDER — ONDANSETRON HCL 4 MG/2ML IJ SOLN
INTRAMUSCULAR | Status: DC | PRN
  Administered 2019-10-07: 4 mg via INTRAVENOUS

## 2019-10-07 MED ORDER — 0.9 % SODIUM CHLORIDE (POUR BTL) OPTIME
TOPICAL | Status: DC | PRN
  Administered 2019-10-07: 1000 mL

## 2019-10-07 MED ORDER — PHENYLEPHRINE HCL-NACL 10-0.9 MG/250ML-% IV SOLN
INTRAVENOUS | Status: DC | PRN
  Administered 2019-10-07: 40 ug/min via INTRAVENOUS

## 2019-10-07 MED ORDER — LIDOCAINE-EPINEPHRINE 1 %-1:100000 IJ SOLN
INTRAMUSCULAR | Status: DC | PRN
  Administered 2019-10-07: 8 mL

## 2019-10-07 MED ORDER — PHENYLEPHRINE 40 MCG/ML (10ML) SYRINGE FOR IV PUSH (FOR BLOOD PRESSURE SUPPORT)
PREFILLED_SYRINGE | INTRAVENOUS | Status: DC | PRN
  Administered 2019-10-07 (×2): 80 ug via INTRAVENOUS

## 2019-10-07 MED ORDER — PROPOFOL 10 MG/ML IV BOLUS
INTRAVENOUS | Status: DC | PRN
  Administered 2019-10-07: 200 mg via INTRAVENOUS
  Administered 2019-10-07: 40 mg via INTRAVENOUS

## 2019-10-07 MED ORDER — OXYMETAZOLINE HCL 0.05 % NA SOLN
NASAL | Status: DC | PRN
  Administered 2019-10-07: 1

## 2019-10-07 MED ORDER — LIDOCAINE-EPINEPHRINE 1 %-1:100000 IJ SOLN
INTRAMUSCULAR | Status: AC
  Filled 2019-10-07: qty 1

## 2019-10-07 MED ORDER — SODIUM CHLORIDE 0.9 % IR SOLN
Status: DC | PRN
  Administered 2019-10-07: 1000 mL

## 2019-10-07 MED ORDER — ROCURONIUM BROMIDE 10 MG/ML (PF) SYRINGE
PREFILLED_SYRINGE | INTRAVENOUS | Status: DC | PRN
  Administered 2019-10-07: 60 mg via INTRAVENOUS

## 2019-10-07 MED ORDER — LIDOCAINE 2% (20 MG/ML) 5 ML SYRINGE
INTRAMUSCULAR | Status: DC | PRN
  Administered 2019-10-07: 60 mg via INTRAVENOUS
  Administered 2019-10-07: 40 mg via INTRAVENOUS

## 2019-10-07 MED ORDER — ACETAMINOPHEN 500 MG PO TABS
1000.0000 mg | ORAL_TABLET | Freq: Once | ORAL | Status: AC
  Administered 2019-10-07: 1000 mg via ORAL
  Filled 2019-10-07: qty 2

## 2019-10-07 MED ORDER — FENTANYL CITRATE (PF) 100 MCG/2ML IJ SOLN
INTRAMUSCULAR | Status: DC | PRN
  Administered 2019-10-07 (×2): 50 ug via INTRAVENOUS
  Administered 2019-10-07: 100 ug via INTRAVENOUS
  Administered 2019-10-07: 50 ug via INTRAVENOUS

## 2019-10-07 MED ORDER — MIDAZOLAM HCL 2 MG/2ML IJ SOLN
INTRAMUSCULAR | Status: AC
  Filled 2019-10-07: qty 2

## 2019-10-07 SURGICAL SUPPLY — 54 items
ATTRACTOMAT 16X20 MAGNETIC DRP (DRAPES) IMPLANT
BLADE ROTATE RAD 40 4 M4 (BLADE) ×2 IMPLANT
BLADE ROTATE RAD 40 4MM M4 (BLADE) ×1
BLADE ROTATE TRICUT 4MX13CM M4 (BLADE) ×1
BLADE ROTATE TRICUT 4X13 M4 (BLADE) ×2 IMPLANT
BLADE SURG 15 STRL LF DISP TIS (BLADE) ×1 IMPLANT
BLADE SURG 15 STRL SS (BLADE) ×3
CANISTER SUCT 3000ML PPV (MISCELLANEOUS) ×6 IMPLANT
COAGULATOR SUCT 6 FR SWTCH (ELECTROSURGICAL)
COAGULATOR SUCT 8FR VV (MISCELLANEOUS) IMPLANT
COAGULATOR SUCT SWTCH 10FR 6 (ELECTROSURGICAL) IMPLANT
COVER WAND RF STERILE (DRAPES) IMPLANT
DRAPE HALF SHEET 40X57 (DRAPES) IMPLANT
DRESSING NASAL KENNEDY 3.5X.9 (MISCELLANEOUS) IMPLANT
DRSG NASAL KENNEDY 3.5X.9 (MISCELLANEOUS)
ELECT COATED BLADE 2.86 ST (ELECTRODE) IMPLANT
ELECT REM PT RETURN 9FT ADLT (ELECTROSURGICAL) ×3
ELECTRODE REM PT RTRN 9FT ADLT (ELECTROSURGICAL) ×1 IMPLANT
FILTER ARTHROSCOPY CONVERTOR (FILTER) ×3 IMPLANT
GAUZE SPONGE 2X2 8PLY STRL LF (GAUZE/BANDAGES/DRESSINGS) ×1 IMPLANT
GLOVE BIOGEL M 7.0 STRL (GLOVE) ×6 IMPLANT
GOWN STRL REUS W/ TWL LRG LVL3 (GOWN DISPOSABLE) ×2 IMPLANT
GOWN STRL REUS W/TWL LRG LVL3 (GOWN DISPOSABLE) ×6
IV NS 1000ML (IV SOLUTION) ×3
IV NS 1000ML BAXH (IV SOLUTION) ×1 IMPLANT
KIT BASIN OR (CUSTOM PROCEDURE TRAY) ×3 IMPLANT
KIT TURNOVER KIT B (KITS) ×3 IMPLANT
NEEDLE 18GX1X1/2 (RX/OR ONLY) (NEEDLE) IMPLANT
NEEDLE HYPO 25GX1X1/2 BEV (NEEDLE) ×3 IMPLANT
NS IRRIG 1000ML POUR BTL (IV SOLUTION) ×3 IMPLANT
PAD ARMBOARD 7.5X6 YLW CONV (MISCELLANEOUS) ×6 IMPLANT
PENCIL BUTTON HOLSTER BLD 10FT (ELECTRODE) IMPLANT
SPECIMEN JAR SMALL (MISCELLANEOUS) ×3 IMPLANT
SPLINT NASAL DOYLE BI-VL (GAUZE/BANDAGES/DRESSINGS) ×3 IMPLANT
SPONGE GAUZE 2X2 STER 10/PKG (GAUZE/BANDAGES/DRESSINGS) ×2
SPONGE NEURO XRAY DETECT 1X3 (DISPOSABLE) ×3 IMPLANT
SUT ETHILON 3 0 FSL (SUTURE) ×3 IMPLANT
SUT ETHILON 3 0 PS 1 (SUTURE) IMPLANT
SUT PLAIN 4 0 ~~LOC~~ 1 (SUTURE) ×3 IMPLANT
SWAB COLLECTION DEVICE MRSA (MISCELLANEOUS) IMPLANT
SWAB CULTURE ESWAB REG 1ML (MISCELLANEOUS) IMPLANT
SYR CONTROL 10ML LL (SYRINGE) ×3 IMPLANT
TOWEL GREEN STERILE FF (TOWEL DISPOSABLE) ×3 IMPLANT
TRACKER ENT INSTRUMENT (MISCELLANEOUS) ×3 IMPLANT
TRACKER ENT PATIENT (MISCELLANEOUS) ×3 IMPLANT
TRAP SPECIMEN MUCOUS 40CC (MISCELLANEOUS) IMPLANT
TRAY ENT MC OR (CUSTOM PROCEDURE TRAY) ×3 IMPLANT
TUBE CONNECTING 12'X1/4 (SUCTIONS) ×1
TUBE CONNECTING 12X1/4 (SUCTIONS) ×2 IMPLANT
TUBE SALEM SUMP 16 FR W/ARV (TUBING) ×3 IMPLANT
TUBING EXTENTION W/L.L. (IV SETS) ×3 IMPLANT
TUBING STRAIGHTSHOT EPS 5PK (TUBING) ×3 IMPLANT
WATER STERILE IRR 1000ML POUR (IV SOLUTION) ×3 IMPLANT
WIPE INSTRUMENT VISIWIPE 73X73 (MISCELLANEOUS) ×3 IMPLANT

## 2019-10-07 NOTE — H&P (Signed)
Connor Jovaughn Kerins Sr. is an 74 y.o. male.   Chief Complaint: Chronic sinusitis and Deviated septum HPI: hx of chronic sinusitis and cough, nasal obstruction  Past Medical History:  Diagnosis Date  . Anxiety    pt. denies  . Arthritis    and back problems  . Cancer [facility])    Bladder Ca 2013, surgically removed  . COPD (chronic obstructive pulmonary disease) ([place])   . History of kidney stones    once  2013 had surgery  . Hypertension   . Shortness of breath    with exertion    Past Surgical History:  Procedure Laterality Date  . BACK SURGERY     lower  . cancer removed     x 2  . EYE SURGERY     bil cataracts  . TRANSURETHRAL RESECTION OF BLADDER TUMOR N/A 11/16/2017   Procedure: TRANSURETHRAL RESECTION OF BLADDER TUMOR (TURBT);  Surgeon: Lucas Mallow, MD;  Location: [facility] ORS;  Service: Urology;  Laterality: N/A;  . TRANSURETHRAL RESECTION OF BLADDER TUMOR WITH MITOMYCIN-C N/A 02/02/2019   Procedure: TRANSURETHRAL RESECTION OF BLADDER TUMOR WITH GEMCITABINE;  Surgeon: Lucas Mallow, MD;  Location: [facility] ORS;  Service: Urology;  Laterality: N/A;    Family History  Problem Relation Age of Onset  . Cancer Brother   . Cancer Father   . Cancer Sister    Social History:  reports that he quit smoking about 6 years ago. His smoking use included cigarettes. He has a 50.00 pack-year smoking history. He has never used smokeless tobacco. He reports that he does not drink alcohol or use drugs.  Allergies: No Known Allergies  Medications Prior to Admission  Medication Sig Dispense Refill  . albuterol (PROVENTIL) (2.5 MG/3ML) 0.083% nebulizer solution Take 3 mLs (2.5 mg total) by nebulization every 6 (six) hours as needed for wheezing or shortness of breath. 75 mL 12  . albuterol (VENTOLIN HFA) 108 (90 Base) MCG/ACT inhaler Inhale 2 puffs into the lungs every 6 (six) hours as needed for wheezing or shortness of breath. 8 g 5  . amLODipine (NORVASC) 10 MG tablet TAKE 1 TABLET(10 MG)  BY MOUTH DAILY (Patient taking differently: Take 10 mg by mouth daily. ) 90 tablet 3  . atorvastatin (LIPITOR) 20 MG tablet TAKE 1 TABLET BY MOUTH BEFORE BEDTIME TO REDUCE CHOLESTEROL (Patient taking differently: Take 20 mg by mouth at bedtime. ) 90 tablet 3  . fluticasone furoate-vilanterol (BREO ELLIPTA) 100-25 MCG/INH AEPB INHALE 1 PUFF INTO THE LUNGS DAILY (Patient taking differently: Inhale 1 puff into the lungs daily. ) 60 each 5  . gabapentin (NEURONTIN) 300 MG capsule TAKE 1 CAPSULE(300 MG) BY MOUTH TWICE DAILY (Patient taking differently: Take 300 mg by mouth 2 (two) times daily as needed (pain). ) 180 capsule 1  . losartan (COZAAR) 100 MG tablet TAKE 1 TABLET BY MOUTH DAILY (Patient taking differently: Take 100 mg by mouth daily. ) 90 tablet 3  . amoxicillin (AMOXIL) 500 MG tablet Take 1 tablet (500 mg total) by mouth 3 (three) times daily. (Patient not taking: Reported on 09/21/2019) 21 tablet 0  . ipratropium-albuterol (DUONEB) 0.5-2.5 (3) MG/3ML SOLN Take 3 mLs by nebulization every 6 (six) hours as needed. For shortness of breath (Patient not taking: Reported on 09/21/2019) 180 mL 3  . sodium chloride (OCEAN) 0.65 % SOLN nasal spray Place 1 spray into both nostrils as needed for congestion. (Patient not taking: Reported on 09/21/2019) 30 mL 0  No results found for this or any previous visit (from the past 48 hour(s)). No results found.  Review of Systems  Constitutional: Negative.   HENT: Positive for congestion and sinus pressure.   Respiratory: Negative.   Cardiovascular: Negative.     Blood pressure (!) 159/75, pulse 89, temperature 97.6 F (36.4 C), temperature source Oral, resp. rate 18, height 5\' 7"  (1.702 m), weight 72.1 kg, SpO2 98 %. Physical Exam  Constitutional: He appears well-developed and well-nourished.  HENT:  Deviated septum  Musculoskeletal:     Cervical back: Normal range of motion and neck supple.     Assessment/Plan Adm for OP ESS, septoplasty and IT  reduction  Jerrell Belfast, MD 10/07/2019, 7:30 AM

## 2019-10-07 NOTE — Transfer of Care (Signed)
Immediate Anesthesia Transfer of Care Note  Patient: Connor Pontiff Sr.  Procedure(s) Performed: SINUS ENDO WITH FUSION (Bilateral Nose) NASAL SEPTOPLASTY WITH TURBINATE REDUCTION (Bilateral Nose)  Patient Location: PACU  Anesthesia Type:General  Level of Consciousness: awake, oriented and patient cooperative  Airway & Oxygen Therapy: Patient Spontanous Breathing and Patient connected to face mask oxygen  Post-op Assessment: Report given to RN and Post -op Vital signs reviewed and stable  Post vital signs: Reviewed and stable  Last Vitals:  Vitals Value Taken Time  BP 110/66 10/07/19 0948  Temp    Pulse 84 10/07/19 0950  Resp 12 10/07/19 0950  SpO2 91 % 10/07/19 0950  Vitals shown include unvalidated device data.  Last Pain:  Vitals:   10/07/19 0634  TempSrc:   PainSc: 0-No pain      Patients Stated Pain Goal: 3 ([id number])  Complications: No apparent anesthesia complications

## 2019-10-07 NOTE — Op Note (Signed)
Operative Note:  ENDOSCOPIC SINUS SURGERY WITH NAVIGATION    SEPTOPLASTY    INFERIOR TURBINATE REDUCTION  Patient: Connor Oharrow Sr.  Medical record number: [id number]  Date:10/07/2019  Pre-operative Indications: 1.  Chronic Sinusitis     2.  Severe nasal septal deviation     3.  Bilateral inferior turbinate hypertrophy   Postoperative Indications: Same  Surgical Procedure: 1.  Bilateral endoscopic sinus surgery with intraoperative computer-assisted navigation (Fusion) consisting of: Bilateral total ethmoidectomy, bilateral maxillary antrostomy with removal of diseased tissue, bilateral nasal frontal recess exploration, bilateral endoscopic resection of concha bullosa    2.  Nasal septoplasty    3. Bilateral Inferior Turbinate Reduction  Anesthesia: GET  Surgeon: Delsa Bern, M.D.  Complications: None  EBL: 150 cc  Findings: Severe nasal airway obstruction from deviated nasal septum and middle turbinate concha bullosa and bilateral inferior turbinate hypertrophy.  Polypoid nasal mucosa with obstruction of the ethmoid and maxillary sinus ostia.  No nasal packing placed.  Bilateral Doyle nasal septal splints placed at the conclusion of the surgical procedure.   Brief History: The patient is a 74 y.o. male with a history of chronic sinusitis and turbinate hypertrophy. The patient has been on medical therapy to reduce nasal mucosal edema and infection including antibiotics, saline nasal spray and topical nasal steroids. Despite appropriate medical therapy the patient continues to have ongoing symptoms. Given the patient's history and findings, the above surgical procedures were recommended, risks and benefits were discussed in detail with the patient may understand and agree with our plan for surgery which is scheduled at [place] under general anesthesia as an outpatient.  Surgical Procedure: The patient is brought to the operating room on 10/07/2019 and placed  in supine position on the operating table. General endotracheal anesthesia was established without difficulty. When the patient was adequately anesthetized, surgical timeout was performed with correct identification of the patient and the surgical procedure. The patient's nose was then injected with 8 cc of 1% lidocaine 1:100,000 dilution epinephrine which was injected in a submucosal fashion. The patient's nose was then packed with Afrin-soaked cottonoid pledgets were left in place for approximately 10 minutes to allow for  vasoconstriction and hemostasis.  The [employer] Fusion navigation headgear was applied in anatomic and surgical landmarks were identified and confirmed, navigation was used throughout the sinus component of the surgical procedure.  With the patient prepped draped and prepared for surgery, nasal nasal endoscopy was performed on the right side.  The patient had a large middle turbinate concha bullosa which was resected endoscopically.  A sickle knife was used to create a vertical incision in the anterior face of the concha mucosa.  Curved endoscopic scissors were then used to dissect the lateral component of the concha and remove it in its entirety creating a widely patent right middle meatus.  Using a 0 degree endoscope and a straight microdebrider, a total ethmoidectomy was performed dissecting from anterior to posterior along the floor of the ethmoid sinus removing bony septations and diseased mucosa.  Using a 45 degree telescope and a curved microdebrider dissection was then carried out along the roof of the ethmoid sinus with navigation, completing a total ethmoidectomy.  Attention was then turned to the nasal frontal recess and again using a curved microdebrider, navigation and endoscopic visualization the nasal frontal recess was widely open, underlying ethmoid cells and diseased mucosa resected creating a widely patent nasal frontal recess.  The lateral nasal wall was inspected, uncinate  process  was resected using a through-cutting forcep and the natural ostium of the maxillary sinus was enlarged in a posterior and inferior direction.  Within the maxillary sinus thick mucoid material and polypoid soft tissue was resected.    The patient's left side was then inspected and resection of the left concha bullosa was undertaken endoscopically with removal of the lateral component creating a patent middle meatus.  Total ethmoidectomy was performed using a 0 degree telescope and straight microdebrider along the floor of the ethmoid sinus, a 45 degree telescope and curved microdebrider with navigation was used along the roof the ethmoid sinus from posterior to anterior to complete a total ethmoidectomy.  The nasal frontal recess was then explored and underlying ethmoid disease was resected with a curved microdebrider, the nasal frontal recess was widely opened and diseased material was resected from within the sinus.  Attention was then turned to the lateral nasal wall, the natural ostium maxillary sinus was identified. The uncinate process was resected.  Diseased mucosa from within the maxilla sinus was then cleared and the ostium was enlarged in a posterior and inferior direction.     A left anterior hemitransfixion incision was created and a mucoperichondrial flap was elevated from anterior to posterior on the left-hand side. The anterior cartilaginous septum was crossed at the midline and a mucoperichondrial flap was elevated on the patient's right.  Swivel knife was then used to resect the anterior and mid cartilaginous portion of the nasal septum.  Resected cartilage was morcellized and returned to the mucoperichondrial pocket at the occlusion of the surgical procedure.  Dissection was then carried out from anterior to posterior removing deviated bone and cartilage including a large septal spur the overlying mucosa was preserved.  With the septum brought to good midline position, the morselized  cartilage was returned to the mucoperichondrial pocket and the soft tissue/mucosal flaps were reapproximated with interrupted 4-0 gut suture on a Keith needle in a horizontal mattressing fashion.  Anterior hemitransfixion incision was closed with the same stitch.  Bilateral Doyle nasal septal splints were then placed after the application of Bactroban ointment and sutured in position with a 3-0 Ethilon suture.  Attention was then turned to the inferior turbinates, bilateral inferior turbinate intramural cautery was performed with cautery setting at 53 W.  2 submucosal passes were made in each inferior turbinate.  After completing cautery, anterior vertical incisions were created and overlying soft tissue was elevated, a small amount of turbinate bone was resected.  The turbinates were then outfractured to create a more patent nasal passageway.  No nasal packing was placed in the common ethmoid cavity bilaterally.  Surgical sponge count was correct. An oral gastric tube was passed and the stomach contents were aspirated. Patient was awakened from anesthetic and transferred from the operating room to the recovery room in stable condition. There were no complications and blood loss was 150 cc.   Delsa Bern, M.D. [facility] 10/07/2019

## 2019-10-07 NOTE — Anesthesia Procedure Notes (Signed)
Procedure Name: Intubation Date/Time: 10/07/2019 7:49 AM Performed by: Orlie Dakin, CRNA Pre-anesthesia Checklist: Patient identified, Emergency Drugs available, Suction available and Patient being monitored Patient Re-evaluated:Patient Re-evaluated prior to induction Oxygen Delivery Method: Circle system utilized Preoxygenation: Pre-oxygenation with 100% oxygen Induction Type: IV induction Ventilation: Mask ventilation without difficulty and Oral airway inserted - appropriate to patient size Laryngoscope Size: Glidescope and 4 Grade View: Grade I Tube type: Oral Tube size: 7.5 mm Number of attempts: 2 (poor view with MAC 3 by SRNA, elected to use glide) Airway Equipment and Method: Oral airway,  Video-laryngoscopy and Rigid stylet Placement Confirmation: ETT inserted through vocal cords under direct vision,  positive ETCO2 and breath sounds checked- equal and bilateral Secured at: 23 cm Tube secured with: Tape Dental Injury: Teeth and Oropharynx as per pre-operative assessment  Comments: Glidescope used for 2nd DL electively due to Grade 3 view with 1st DL with MAC 3.  Intubation by Clyde Lundborg

## 2019-10-07 NOTE — Anesthesia Postprocedure Evaluation (Signed)
Anesthesia Post Note  Patient: Connor Pontiff Sr.  Procedure(s) Performed: SINUS ENDO WITH FUSION (Bilateral Nose) NASAL SEPTOPLASTY WITH TURBINATE REDUCTION (Bilateral Nose)     Patient location during evaluation: PACU Anesthesia Type: General Level of consciousness: sedated Pain management: pain level controlled Vital Signs Assessment: post-procedure vital signs reviewed and stable Respiratory status: spontaneous breathing and respiratory function stable Cardiovascular status: stable Postop Assessment: no apparent nausea or vomiting Anesthetic complications: no    Last Vitals:  Vitals:   10/07/19 1000 10/07/19 1015  BP: (!) 102/51 (!) 107/55  Pulse: 80 72  Resp: 12 15  Temp:  36.4 C  SpO2: 93% 95%    Last Pain:  Vitals:   10/07/19 1015  TempSrc:   PainSc: 0-No pain                 Becka Lagasse DANIEL

## 2019-10-10 ENCOUNTER — Other Ambulatory Visit: Payer: Self-pay | Admitting: Adult Health

## 2019-10-10 LAB — SURGICAL PATHOLOGY

## 2019-10-12 NOTE — Telephone Encounter (Signed)
SENT TO THE PHARMACY BY E-SCRIBE. 

## 2019-10-19 ENCOUNTER — Encounter: Payer: Self-pay | Admitting: Anesthesiology

## 2019-10-19 NOTE — Addendum Note (Signed)
Addendum  created 10/19/19 1641 by Duane Boston, MD   Intraprocedure Event edited, Intraprocedure Staff edited

## 2019-12-14 ENCOUNTER — Other Ambulatory Visit: Payer: Self-pay | Admitting: Adult Health

## 2019-12-14 DIAGNOSIS — I10 Essential (primary) hypertension: Secondary | ICD-10-CM

## 2019-12-14 NOTE — Telephone Encounter (Signed)
30 DAY SUPPLY SENT TO THE PHARMACY BY E-SCRIBE.  PT IS DUE FOR CPX. 

## 2019-12-17 ENCOUNTER — Other Ambulatory Visit: Payer: Self-pay | Admitting: Adult Health

## 2019-12-17 DIAGNOSIS — I10 Essential (primary) hypertension: Secondary | ICD-10-CM

## 2019-12-19 ENCOUNTER — Other Ambulatory Visit: Payer: Self-pay | Admitting: Adult Health

## 2019-12-20 NOTE — Telephone Encounter (Signed)
[place] ON 12/14/19.  PT IS DUE FOR CPX.  NEEDS TO BE SCHEDULED.

## 2019-12-20 NOTE — Telephone Encounter (Signed)
DENIED.  FILLED ON 12/14/19 FOR 30 DAYS.  PT IS PAST DUE FOR CPX.

## 2020-01-04 ENCOUNTER — Encounter: Payer: Self-pay | Admitting: Adult Health

## 2020-01-04 ENCOUNTER — Other Ambulatory Visit: Payer: Self-pay

## 2020-01-04 ENCOUNTER — Ambulatory Visit: Payer: Medicare Other | Admitting: Adult Health

## 2020-01-04 DIAGNOSIS — J329 Chronic sinusitis, unspecified: Secondary | ICD-10-CM

## 2020-01-04 DIAGNOSIS — J4540 Moderate persistent asthma, uncomplicated: Secondary | ICD-10-CM

## 2020-01-04 NOTE — Assessment & Plan Note (Signed)
Good control on current regimen Declines flu shot and Covid vaccine, patient education given  Plan  Patient Instructions  Continue on BREO daily .  Mucinex DM Twice daily  As needed  Cough/congesiton  Activity as tolerated.  Follow up with Dr. Elsworth Soho  In 6 months and As needed   Please contact office for sooner follow up if symptoms do not improve or worsen or seek emergency care ]

## 2020-01-04 NOTE — Progress Notes (Signed)
@Patient  ID: Connor Pontiff Sr., male    DOB: [birth year], 74 y.o.   MRN: [id number]  Chief Complaint  Patient presents with  . Follow-up    Asthma     Referring provider: Dorothyann Peng, NP  HPI: 74 year old male former smoker followed for COPD and asthma Medical history significant for bladder cancer status post transurethral urethral resection of the bladder tumor and intravesicular instillation of chemotherapy in August 2020, hypertension, chronic kidney disease stage [name]   TEST/EVENTS :  CT sinus 2019 clear CT chest angio 02/2019 neg PE, mild emphysema  Spirometry6/2015FEV1 43% - 1.31 - with ratio 53 Post bronchodilator, FEV1 improved to 1.56 -significant bronchodilator response.  Arlyce Harman 10/2014 FEV 1 69%   3/2019Spirometry-FEV1 at 82%, ratio 63, FVC 95% IgE 216, RAST neg  01/04/2020 Follow up : COPD and Asthma  Patient presents for a 4-month follow-up.  Patient has underlying COPD and asthma.  Says overall he has been doing well with no flare of cough or wheezing.  He remains on Breo daily.  Rarely uses albuterol or nebs.  Declines Covid vaccine and flu shot  Activity level is the same, sedentary lifestyle.  Feels good, no complaints today .   Had nasal surgery 2 months ago, went well.    No Known Allergies  Immunization History  Administered Date(s) Administered  . Pneumococcal Polysaccharide-23 02/18/2019    Past Medical History:  Diagnosis Date  . Anxiety    pt. denies  . Arthritis    and back problems  . Cancer [facility])    Bladder Ca 2013, surgically removed  . COPD (chronic obstructive pulmonary disease) ([place])   . History of kidney stones    once  2013 had surgery  . Hypertension   . Shortness of breath    with exertion    Tobacco History: Social History   Tobacco Use  Smoking Status Former Smoker  . Packs/day: 1.00  . Years: 50.00  . Pack years: 50.00  . Types: Cigarettes  . Quit date: 09/21/2013  . Years since quitting: 6.2    Smokeless Tobacco Never Used   Counseling given: Not Answered   Outpatient Medications Prior to Visit  Medication Sig Dispense Refill  . albuterol (PROVENTIL) (2.5 MG/3ML) 0.083% nebulizer solution Take 3 mLs (2.5 mg total) by nebulization every 6 (six) hours as needed for wheezing or shortness of breath. 75 mL 12  . albuterol (VENTOLIN HFA) 108 (90 Base) MCG/ACT inhaler Inhale 2 puffs into the lungs every 6 (six) hours as needed for wheezing or shortness of breath. 8 g 5  . amLODipine (NORVASC) 10 MG tablet Take 1 tablet by mouth daily.  **DUE FOR YEARLY PHYSICAL** 30 tablet 0  . atorvastatin (LIPITOR) 20 MG tablet TAKE 1 TABLET BY MOUTH BEFORE BEDTIME TO REDUCE CHOLESTEROL 90 tablet 0  . fluticasone furoate-vilanterol (BREO ELLIPTA) 100-25 MCG/INH AEPB INHALE 1 PUFF INTO THE LUNGS DAILY (Patient taking differently: Inhale 1 puff into the lungs daily. ) 60 each 5  . gabapentin (NEURONTIN) 300 MG capsule TAKE 1 CAPSULE(300 MG) BY MOUTH TWICE DAILY (Patient taking differently: Take 300 mg by mouth 2 (two) times daily as needed (pain). ) 180 capsule 1  . ipratropium-albuterol (DUONEB) 0.5-2.5 (3) MG/3ML SOLN Take 3 mLs by nebulization every 6 (six) hours as needed. For shortness of breath 180 mL 3  . losartan (COZAAR) 100 MG tablet Take 1 tablet (100 mg total) by mouth daily. **DUE FOR YEARLY PHYSICAL** 30 tablet 0  . sodium chloride (  OCEAN) 0.65 % SOLN nasal spray Place 1 spray into both nostrils as needed for congestion. (Patient not taking: Reported on 09/21/2019) 30 mL 0   No facility-administered medications prior to visit.     Review of Systems:   Constitutional:   No  weight loss, night sweats,  Fevers, chills,  +fatigue, or  lassitude.  HEENT:   No headaches,  Difficulty swallowing,  Tooth/dental problems, or  Sore throat,                No sneezing, itching, ear ache, nasal congestion, post nasal drip,   CV:  No chest pain,  Orthopnea, PND, swelling in lower extremities,  anasarca, dizziness, palpitations, syncope.   GI  No heartburn, indigestion, abdominal pain, nausea, vomiting, diarrhea, change in bowel habits, loss of appetite, bloody stools.   Resp:    No excess mucus, no productive cough,  No non-productive cough,  No coughing up of blood.  No change in color of mucus.  No wheezing.  No chest wall deformity  Skin: no rash or lesions.  GU: no dysuria, change in color of urine, no urgency or frequency.  No flank pain, no hematuria   MS:  No joint pain or swelling.  No decreased range of motion.  No back pain.    Physical Exam  BP (!) 140/92 (BP Location: Left Arm, Cuff Size: Normal)   Pulse 76   Ht 5\' 7"  (1.702 m)   Wt 161 lb (73 kg)   SpO2 95%   BMI 25.22 kg/m   GEN: A/Ox3; pleasant , NAD, well nourished    HEENT:  [place]/AT,    NOSE-clear, THROAT-clear, no lesions, no postnasal drip or exudate noted.   NECK:  Supple w/ fair ROM; no JVD; normal carotid impulses w/o bruits; no thyromegaly or nodules palpated; no lymphadenopathy.    RESP  Clear  P & A; w/o, wheezes/ rales/ or rhonchi. no accessory muscle use, no dullness to percussion  CARD:  RRR, no m/r/g, no peripheral edema, pulses intact, no cyanosis or clubbing.  GI:   Soft & nt; nml bowel sounds; no organomegaly or masses detected.   Musco: Warm bil, no deformities or joint swelling noted.   Neuro: alert, no focal deficits noted.    Skin: Warm, no lesions or rashes    Lab Results:  CBC  BNP  Imaging: No results found.    No flowsheet data found.  No results found for: NITRICOXIDE      Assessment & Plan:   Asthma Good control on current regimen Declines flu shot and Covid vaccine, patient education given  Plan  Patient Instructions  Continue on BREO daily .  Mucinex DM Twice daily  As needed  Cough/congesiton  Activity as tolerated.  Follow up with Dr. Elsworth Soho  In 6 months and As needed   Please contact office for sooner follow up if symptoms do not improve  or worsen or seek emergency care ]        Sinusitis, chronic Appears to be under good control.     Rexene Edison, NP 01/04/2020

## 2020-01-04 NOTE — Assessment & Plan Note (Signed)
Appears to be under good control.

## 2020-01-04 NOTE — Patient Instructions (Signed)
Continue on BREO daily .  Mucinex DM Twice daily  As needed  Cough/congesiton  Activity as tolerated.  Follow up with Dr. Elsworth Soho  In 6 months and As needed   Please contact office for sooner follow up if symptoms do not improve or worsen or seek emergency care ]

## 2020-01-08 ENCOUNTER — Other Ambulatory Visit: Payer: Self-pay | Admitting: Adult Health

## 2020-01-10 ENCOUNTER — Other Ambulatory Visit: Payer: Self-pay | Admitting: Adult Health

## 2020-01-10 NOTE — Telephone Encounter (Signed)
[place] TO THE PHARMACY.  PT DUE FOR CPX.

## 2020-02-07 ENCOUNTER — Other Ambulatory Visit: Payer: Self-pay | Admitting: Adult Health

## 2020-03-01 ENCOUNTER — Other Ambulatory Visit: Payer: Self-pay | Admitting: Adult Health

## 2020-03-01 DIAGNOSIS — I10 Essential (primary) hypertension: Secondary | ICD-10-CM

## 2020-03-02 ENCOUNTER — Other Ambulatory Visit: Payer: Self-pay | Admitting: Adult Health

## 2020-03-02 DIAGNOSIS — G629 Polyneuropathy, unspecified: Secondary | ICD-10-CM

## 2020-03-07 ENCOUNTER — Other Ambulatory Visit: Payer: Self-pay | Admitting: Adult Health

## 2020-03-08 ENCOUNTER — Other Ambulatory Visit: Payer: Self-pay | Admitting: Adult Health

## 2020-04-08 ENCOUNTER — Other Ambulatory Visit: Payer: Self-pay | Admitting: Adult Health

## 2020-04-08 DIAGNOSIS — G629 Polyneuropathy, unspecified: Secondary | ICD-10-CM

## 2020-04-20 ENCOUNTER — Other Ambulatory Visit: Payer: Self-pay

## 2020-04-20 ENCOUNTER — Ambulatory Visit (INDEPENDENT_AMBULATORY_CARE_PROVIDER_SITE_OTHER): Payer: Medicare Other | Admitting: Adult Health

## 2020-04-20 ENCOUNTER — Encounter: Payer: Self-pay | Admitting: Adult Health

## 2020-04-20 VITALS — BP 160/86 | HR 88 | Temp 98.6°F | Resp 16 | Ht 67.0 in | Wt 166.8 lb

## 2020-04-20 DIAGNOSIS — Z125 Encounter for screening for malignant neoplasm of prostate: Secondary | ICD-10-CM

## 2020-04-20 DIAGNOSIS — I10 Essential (primary) hypertension: Secondary | ICD-10-CM

## 2020-04-20 DIAGNOSIS — Z0000 Encounter for general adult medical examination without abnormal findings: Secondary | ICD-10-CM | POA: Diagnosis not present

## 2020-04-20 DIAGNOSIS — G629 Polyneuropathy, unspecified: Secondary | ICD-10-CM

## 2020-04-20 DIAGNOSIS — E785 Hyperlipidemia, unspecified: Secondary | ICD-10-CM | POA: Diagnosis not present

## 2020-04-20 DIAGNOSIS — J4520 Mild intermittent asthma, uncomplicated: Secondary | ICD-10-CM

## 2020-04-20 DIAGNOSIS — R7303 Prediabetes: Secondary | ICD-10-CM

## 2020-04-20 DIAGNOSIS — C679 Malignant neoplasm of bladder, unspecified: Secondary | ICD-10-CM

## 2020-04-20 MED ORDER — AMLODIPINE BESYLATE 10 MG PO TABS: ORAL_TABLET | ORAL | 3 refills | Status: DC

## 2020-04-20 MED ORDER — GABAPENTIN 300 MG PO CAPS: 300.0000 mg | ORAL_CAPSULE | Freq: Two times a day (BID) | ORAL | 1 refills | Status: DC

## 2020-04-20 MED ORDER — LOSARTAN POTASSIUM 100 MG PO TABS: ORAL_TABLET | ORAL | 3 refills | Status: DC

## 2020-04-20 MED ORDER — ATORVASTATIN CALCIUM 20 MG PO TABS: ORAL_TABLET | ORAL | 3 refills | Status: DC

## 2020-04-20 NOTE — Addendum Note (Signed)
Addended by: Marrion Coy on: 04/20/2020 09:46 AM   Modules accepted: Orders

## 2020-04-20 NOTE — Patient Instructions (Signed)
It was great seeing you today   We will follow up with you regarding your blood work   Please see me back in one year or sooner if needed  Work on diet and exercise for heart healthy

## 2020-04-20 NOTE — Progress Notes (Signed)
Subjective:    Patient ID: Connor Pontiff Sr., male    DOB: 1946/02/18, 74 y.o.   MRN: [id number]  HPI  Patient presents for yearly preventative medicine examination. He is a pleasant 74 year old male who  has a past medical history of Anxiety, Arthritis, Cancer ([place]), COPD (chronic obstructive pulmonary disease) ([place]), History of kidney stones, Hypertension, and Shortness of breath.  Essential Hypertension -currently prescribed Norvasc 10 mg and losartan 100 mg daily.  He denies lightheadedness, dizziness, chest pain, or worsening shortness of breath. He has been out of his blood pressure medication for a few weeks.   BP Readings from Last 3 Encounters:  04/20/20 (!) 160/86  01/04/20 (!) 140/92  10/07/19 (!) 107/55    Hyperlipidemia-takes Lipitor 20 mg nightly.  He denies myalgia or fatigue  Lab Results  Component Value Date   CHOL 152 12/08/2018   HDL 43.60 12/08/2018   LDLCALC 80 12/08/2018   LDLDIRECT 186.0 10/01/2017   TRIG 142.0 12/08/2018   CHOLHDL 3 12/08/2018    COPD and Asthma -managed by pulmonary.  Is on Breo daily, rarely uses albuterol or nebs.  Has had no recent exacerbations or wheezing.  Lower extremity neuropathy -currently prescribed gabapentin 300 mg twice daily.  He feels as though this dose works well for him and has no complaints  H/o Bladder Cancer -history of 3 resections in the past the most recent being in August 2020.  Glucose Intolerance - not currently on any medications  Lab Results  Component Value Date   HGBA1C 6.3 12/08/2018   All immunizations and health maintenance protocols were reviewed with the patient and needed orders were placed. He refuses all vaccinations   Appropriate screening laboratory values were ordered for the patient including screening of hyperlipidemia, renal function and hepatic function. If indicated by BPH, a PSA was ordered.  Medication reconciliation,  past medical history, social history, problem list and  allergies were reviewed in detail with the patient  Goals were established with regard to weight loss, exercise, and  diet in compliance with medications. Does not exercise nor eat a heart healthy diet, he is sedentary   Wt Readings from Last 3 Encounters:  04/20/20 166 lb 12.8 oz (75.7 kg)  01/04/20 161 lb (73 kg)  10/07/19 159 lb (72.1 kg)   Refuses colonocopy or cologuard  Has no acute complaints   Review of Systems  Constitutional: Negative.   HENT: Negative.   Eyes: Negative.   Respiratory: Negative.   Cardiovascular: Negative.   Gastrointestinal: Negative.   Endocrine: Negative.   Genitourinary: Negative.   Musculoskeletal: Negative.   Skin: Negative.   Allergic/Immunologic: Negative.   Neurological: Negative.   Hematological: Negative.   Psychiatric/Behavioral: Negative.   All other systems reviewed and are negative.  Past Medical History:  Diagnosis Date   Anxiety    pt. denies   Arthritis    and back problems   Cancer [facility])    Bladder Ca 2013, surgically removed   COPD (chronic obstructive pulmonary disease) ([place])    History of kidney stones    once  2013 had surgery   Hypertension    Shortness of breath    with exertion    Social History   Socioeconomic History   Marital status: Married    Spouse name: Not on file   Number of children: 5   Years of education: 6   Highest education level: Not on file  Occupational History   Not on file  Tobacco Use   Smoking status: Former Smoker    Packs/day: 1.00    Years: 50.00    Pack years: 50.00    Types: Cigarettes    Quit date: 09/21/2013    Years since quitting: 6.5   Smokeless tobacco: Never Used  Vaping Use   Vaping Use: Never used  Substance and Sexual Activity   Alcohol use: No   Drug use: No   Sexual activity: Yes    Birth control/protection: None  Other Topics Concern   Not on file  Social History Narrative   Worked as a Games developer - since retired.    Social  Determinants of Health   Financial Resource Strain:    Difficulty of Paying Living Expenses: Not on file  Food Insecurity:    Worried About Charity fundraiser in the Last Year: Not on file   [employer] of Food in the Last Year: Not on file  Transportation Needs:    Lack of Transportation (Medical): Not on file   Lack of Transportation (Non-Medical): Not on file  Physical Activity:    Days of Exercise per Week: Not on file   Minutes of Exercise per Session: Not on file  Stress:    Feeling of Stress : Not on file  Social Connections:    Frequency of Communication with Friends and Family: Not on file   Frequency of Social Gatherings with Friends and Family: Not on file   Attends Religious Services: Not on file   Active Member of Clubs or Organizations: Not on file   Attends Archivist Meetings: Not on file   Marital Status: Not on file  Intimate Partner Violence:    Fear of Current or Ex-Partner: Not on file   Emotionally Abused: Not on file   Physically Abused: Not on file   Sexually Abused: Not on file    Past Surgical History:  Procedure Laterality Date   BACK SURGERY     lower   cancer removed     x 2   EYE SURGERY     bil cataracts   NASAL SEPTOPLASTY W/ TURBINOPLASTY Bilateral 10/07/2019   Procedure: NASAL SEPTOPLASTY WITH TURBINATE REDUCTION;  Surgeon: Jerrell Belfast, MD;  Location: [facility];  Service: ENT;  Laterality: Bilateral;   SINUS ENDO WITH FUSION Bilateral 10/07/2019   Procedure: SINUS ENDO WITH FUSION;  Surgeon: Jerrell Belfast, MD;  Location: [facility];  Service: ENT;  Laterality: Bilateral;   TRANSURETHRAL RESECTION OF BLADDER TUMOR N/A 11/16/2017   Procedure: TRANSURETHRAL RESECTION OF BLADDER TUMOR (TURBT);  Surgeon: Lucas Mallow, MD;  Location: [facility];  Service: Urology;  Laterality: N/A;   TRANSURETHRAL RESECTION OF BLADDER TUMOR WITH MITOMYCIN-C N/A 02/02/2019   Procedure: TRANSURETHRAL RESECTION OF BLADDER TUMOR WITH  GEMCITABINE;  Surgeon: Lucas Mallow, MD;  Location: [facility];  Service: Urology;  Laterality: N/A;    Family History  Problem Relation Age of Onset   Cancer Brother    Cancer Father    Cancer Sister     No Known Allergies  Current Outpatient Medications on File Prior to Visit  Medication Sig Dispense Refill   albuterol (PROVENTIL) (2.5 MG/3ML) 0.083% nebulizer solution Take 3 mLs (2.5 mg total) by nebulization every 6 (six) hours as needed for wheezing or shortness of breath. 75 mL 12   albuterol (VENTOLIN HFA) 108 (90 Base) MCG/ACT inhaler Inhale 2 puffs into the lungs every 6 (six) hours as needed for wheezing or shortness of breath.  8 g 5   amLODipine (NORVASC) 10 MG tablet TAKE 1 TABLET BY MOUTH DAILY 30 tablet 0   atorvastatin (LIPITOR) 20 MG tablet TAKE 1 TABLET BY MOUTH BEFORE BEDTIME TO REDUCE CHOLESTEROL.  **DUE FOR PHYSICAL** 30 tablet 0   fluticasone furoate-vilanterol (BREO ELLIPTA) 100-25 MCG/INH AEPB INHALE 1 PUFF INTO THE LUNGS DAILY (Patient taking differently: Inhale 1 puff into the lungs daily. ) 60 each 5   gabapentin (NEURONTIN) 300 MG capsule TAKE 1 CAPSULE(300 MG) BY MOUTH TWICE DAILY 60 capsule 0   ipratropium-albuterol (DUONEB) 0.5-2.5 (3) MG/3ML SOLN Take 3 mLs by nebulization every 6 (six) hours as needed. For shortness of breath 180 mL 3   losartan (COZAAR) 100 MG tablet TAKE 1 TABLET(100 MG) BY MOUTH DAILY 30 tablet 0   No current facility-administered medications on file prior to visit.    BP (!) 160/86 (BP Location: Right Arm, Patient Position: Sitting, Cuff Size: Small)    Pulse 88    Temp 98.6 F (37 C) (Oral)    Resp 16    Ht _0  (1.702 m)    Wt 166 lb 12.8 oz (75.7 kg)    SpO2 98%    BMI 26.12 kg/m       Objective:   Physical Exam Vitals and nursing note reviewed.  Constitutional:      General: He is not in acute distress.    Appearance: Normal appearance. He is well-developed and normal weight.  HENT:     Head: Normocephalic  and atraumatic.     Right Ear: Tympanic membrane, ear canal and external ear normal. There is no impacted cerumen.     Left Ear: Tympanic membrane, ear canal and external ear normal. There is no impacted cerumen.     Nose: Nose normal. No congestion or rhinorrhea.     Mouth/Throat:     Mouth: Mucous membranes are moist.     Dentition: Abnormal dentition.     Pharynx: Oropharynx is clear. No oropharyngeal exudate or posterior oropharyngeal erythema.  Eyes:     General:        Right eye: No discharge.        Left eye: No discharge.     Extraocular Movements: Extraocular movements intact.     Conjunctiva/sclera: Conjunctivae normal.     Pupils: Pupils are equal, round, and reactive to light.  Neck:     Vascular: No carotid bruit.     Trachea: No tracheal deviation.  Cardiovascular:     Rate and Rhythm: Normal rate and regular rhythm.     Pulses: Normal pulses.     Heart sounds: Normal heart sounds. No murmur heard.  No friction rub. No gallop.   Pulmonary:     Effort: Pulmonary effort is normal. No respiratory distress.     Breath sounds: Normal breath sounds. No stridor. No wheezing, rhonchi or rales.  Chest:     Chest wall: No tenderness.  Abdominal:     General: Bowel sounds are normal. There is no distension.     Palpations: Abdomen is soft. There is no mass.     Tenderness: There is no abdominal tenderness. There is no right CVA tenderness, left CVA tenderness, guarding or rebound.     Hernia: No hernia is present.  Musculoskeletal:        General: No swelling, tenderness, deformity or signs of injury. Normal range of motion.     Right lower leg: No edema.     Left lower leg: No  edema.  Lymphadenopathy:     Cervical: No cervical adenopathy.  Skin:    General: Skin is warm and dry.     Capillary Refill: Capillary refill takes less than 2 seconds.     Coloration: Skin is not jaundiced or pale.     Findings: No bruising, erythema, lesion or rash.  Neurological:      General: No focal deficit present.     Mental Status: He is alert and oriented to person, place, and time.     Cranial Nerves: No cranial nerve deficit.     Sensory: No sensory deficit.     Motor: No weakness.     Coordination: Coordination normal.     Gait: Gait normal.     Deep Tendon Reflexes: Reflexes normal.  Psychiatric:        Mood and Affect: Mood normal.        Behavior: Behavior normal.        Thought Content: Thought content normal.        Judgment: Judgment normal.       Assessment & Plan:  1. Routine general medical examination at a health care facility - Encouraged lifestyle modifications  - Follow up in 1 year or sooner if needed - CBC with Differential/Platelet; Future - Hemoglobin A1c; Future - Lipid panel; Future - TSH; Future - CMP with eGFR([facility]); Future  2. Hyperlipidemia, unspecified hyperlipidemia type - consider increase in statin  - CBC with Differential/Platelet; Future - Hemoglobin A1c; Future - Lipid panel; Future - TSH; Future - CMP with eGFR([facility]); Future  3. Essential hypertension - Has not been with his BP medications for a few weeks. BP elevated in the office today  - Follow up if not improved at home in the next 2-3 weeks  - CBC with Differential/Platelet; Future - Hemoglobin A1c; Future - Lipid panel; Future - TSH; Future - CMP with eGFR([facility]); Future - amLODipine (NORVASC) 10 MG tablet; TAKE 1 TABLET BY MOUTH DAILY  Dispense: 90 tablet; Refill: 3 - losartan (COZAAR) 100 MG tablet; TAKE 1 TABLET(100 MG) BY MOUTH DAILY  Dispense: 90 tablet; Refill: 3  4. Pre-diabetes - consider metformin  - CBC with Differential/Platelet; Future - Hemoglobin A1c; Future - Lipid panel; Future - TSH; Future - CMP with eGFR([facility]); Future  5. Malignant neoplasm of urinary bladder, unspecified site [facility]) - Follow up with Urology as directed  6. Mild intermittent asthma without complication - Continue with Breo. Encouraged to follow up withy  pulmonary as directed  7. Prostate cancer screening  - PSA; Future  8. Neuropathy  - gabapentin (NEURONTIN) 300 MG capsule; Take 1 capsule (300 mg total) by mouth 2 (two) times daily. TAKE 1 CAPSULE(300 MG) BY MOUTH TWICE DAILY  Dispense: 180 capsule; Refill: 1  Dorothyann Peng, NP

## 2020-04-21 LAB — COMPLETE METABOLIC PANEL WITH GFR
AG Ratio: 1.8 (calc) (ref 1.0–2.5)
ALT: 18 U/L (ref 9–46)
AST: 16 U/L (ref 10–35)
Albumin: 4.4 g/dL (ref 3.6–5.1)
Alkaline phosphatase (APISO): 76 U/L (ref 35–144)
BUN/Creatinine Ratio: 15 (calc) (ref 6–22)
BUN: 19 mg/dL (ref 7–25)
CO2: 23 mmol/L (ref 20–32)
Calcium: 9.9 mg/dL (ref 8.6–10.3)
Chloride: 104 mmol/L (ref 98–110)
Creat: 1.3 mg/dL — ABNORMAL HIGH (ref 0.70–1.18)
GFR, Est African American: 62 mL/min/{1.73_m2} (ref >=60)
GFR, Est Non African American: 53 mL/min/{1.73_m2} — ABNORMAL LOW (ref >=60)
Globulin: 2.5 g/dL (calc) (ref 1.9–3.7)
Glucose, Bld: 98 mg/dL (ref 65–99)
Potassium: 4.6 mmol/L (ref 3.5–5.3)
Sodium: 135 mmol/L (ref 135–146)
Total Bilirubin: 0.6 mg/dL (ref 0.2–1.2)
Total Protein: 6.9 g/dL (ref 6.1–8.1)

## 2020-04-21 LAB — CBC WITH DIFFERENTIAL/PLATELET
Absolute Monocytes: 1142 cells/uL — ABNORMAL HIGH (ref 200–950)
Basophils Absolute: 115 cells/uL (ref 0–200)
Basophils Relative: 1.2 %
Eosinophils Absolute: 518 cells/uL — ABNORMAL HIGH (ref 15–500)
Eosinophils Relative: 5.4 %
HCT: 44.2 % (ref 38.5–50.0)
Hemoglobin: 14.8 g/dL (ref 13.2–17.1)
Lymphs Abs: 2102 cells/uL (ref 850–3900)
MCH: 29.4 pg (ref 27.0–33.0)
MCHC: 33.5 g/dL (ref 32.0–36.0)
MCV: 87.7 fL (ref 80.0–100.0)
MPV: 9.8 fL (ref 7.5–12.5)
Monocytes Relative: 11.9 %
Neutro Abs: 5722 cells/uL (ref 1500–7800)
Neutrophils Relative %: 59.6 %
Platelets: 453 10*3/uL — ABNORMAL HIGH (ref 140–400)
RBC: 5.04 10*6/uL (ref 4.20–5.80)
RDW: 15.4 % — ABNORMAL HIGH (ref 11.0–15.0)
Total Lymphocyte: 21.9 %
WBC: 9.6 10*3/uL (ref 3.8–10.8)

## 2020-04-21 LAB — LIPID PANEL
Cholesterol: 250 mg/dL — ABNORMAL HIGH (ref <200)
HDL: 46 mg/dL (ref >=40)
LDL Cholesterol (Calc): 169 mg/dL (calc) — ABNORMAL HIGH
Non-HDL Cholesterol (Calc): 204 mg/dL (calc) — ABNORMAL HIGH (ref <130)
Total CHOL/HDL Ratio: 5.4 (calc) — ABNORMAL HIGH (ref <5.0)
Triglycerides: 195 mg/dL — ABNORMAL HIGH (ref <150)

## 2020-04-21 LAB — TSH: TSH: 3.43 mIU/L (ref 0.40–4.50)

## 2020-04-21 LAB — HEMOGLOBIN A1C
Hgb A1c MFr Bld: 6 % of total Hgb — ABNORMAL HIGH (ref <5.7)
Mean Plasma Glucose: 126 (calc)
eAG (mmol/L): 7 (calc)

## 2020-04-21 LAB — PSA: PSA: 1.09 ng/mL (ref <=4.0)

## 2020-06-07 ENCOUNTER — Other Ambulatory Visit: Payer: Self-pay

## 2020-06-07 ENCOUNTER — Telehealth (INDEPENDENT_AMBULATORY_CARE_PROVIDER_SITE_OTHER): Payer: Medicare Other | Admitting: Family Medicine

## 2020-06-07 DIAGNOSIS — R059 Cough, unspecified: Secondary | ICD-10-CM | POA: Diagnosis not present

## 2020-06-07 DIAGNOSIS — R509 Fever, unspecified: Secondary | ICD-10-CM | POA: Diagnosis not present

## 2020-06-07 DIAGNOSIS — R0981 Nasal congestion: Secondary | ICD-10-CM | POA: Diagnosis not present

## 2020-06-07 MED ORDER — BENZONATATE 100 MG PO CAPS
100.0000 mg | ORAL_CAPSULE | Freq: Three times a day (TID) | ORAL | 0 refills | Status: DC | PRN
Start: 2020-06-07 — End: 2021-04-16

## 2020-06-07 MED ORDER — DOXYCYCLINE HYCLATE 100 MG PO TABS
100.0000 mg | ORAL_TABLET | Freq: Two times a day (BID) | ORAL | 0 refills | Status: DC
Start: 2020-06-07 — End: 2021-04-16

## 2020-06-07 NOTE — Patient Instructions (Addendum)
  HOME CARE TIPS:  -[place] COVID19 testing information: [email or web address] [place] [phone] Most pharmacies also offer testing and home test kits.  -I sent the medication(s) we discussed to your pharmacy: Meds ordered this encounter  Medications  . benzonatate (TESSALON PERLES) 100 MG capsule    Sig: Take 1 capsule (100 mg total) by mouth 3 (three) times daily as needed.    Dispense:  20 capsule    Refill:  0  . doxycycline (VIBRA-TABS) 100 MG tablet    Sig: Take 1 tablet (100 mg total) by mouth 2 (two) times daily.    Dispense:  20 tablet    Refill:  0     -COVID19 outpatient treatment center: [phone] (only call if your Covid test is positive and you are interested in monoclonal antibody treatment which is available to those with risk factors within 7 days of symptom onset)  -use your inhalers per instructions - seek prompt inperson care if symptoms not responding to your inhaler, difficulty breathing, chest pain or symptoms worsening.  -can use tylenol or aleve if needed for fevers, aches and pains per instructions  -can use nasal saline a few times per day if nasal congestionl  -stay hydrated, drink plenty of fluids and eat small healthy meals - avoid dairy  -can take 1000 IU ( ) Vit D3 daily and Vit C 500mg  daily per instructions  -If the Covid test is positive, schedule follow up with your doctor, check out the [facility] website for more information on home care, transmission and treatment for COVID19  -follow up with your doctor in 2-3 days unless improving and feeling better  -stay home while sick, except to seek medical care, and if you have COVID19 please stay home for a full 10 days since the onset of symptoms PLUS one day of no fever and feeling better.  It was nice to meet you today, and I really hope you are feeling better soon. I help [place] out with telemedicine visits on Tuesdays and Thursdays and am available for  visits on those days. If you have any concerns or questions following this visit please schedule a follow up visit with your Primary Care doctor or seek care at a local urgent care clinic to avoid delays in care.    Seek in person care promptly if your symptoms worsen, new concerns arise or you are not improving with treatment. Call 911 and/or seek emergency care if you symptoms are severe or life threatening.

## 2020-06-07 NOTE — Progress Notes (Signed)
Virtual Visit via Telephone Note  I connected with Connor Shanta Dorvil Sr. on 06/07/20 at 12:40 PM EST by telephone and verified that I am speaking with the correct person using two identifiers.   I discussed the limitations, risks, security and privacy concerns of performing an evaluation and management service by telephone and the availability of in person appointments. I also discussed with the patient that there may be a patient responsible charge related to this service. The patient expressed understanding and agreed to proceed.  Location patient: home, [place] Location provider: work or home office Participants present for the call: patient, provider Patient did not have a visit with me in the prior 7 days to address this/these issue(s).   History of Present Illness:  Acute telemedicine visit for cough and congestion : -Onset: 2 days ago -Symptoms include:lots nasal congestion, cough, low grade fever 100.5 yesterday, chills, sore throat, body aches -Denies: CP, SOB, NVD, loss of taste or smell, no known sick contact -Has tried: used albuterol once the first day, but reports has not needed it since -Pertinent past medical history: asthma, htn  -Pertinent medication allergies: denies drug allergies -COVID-19 vaccine status: not vaccinated, did not have flu shot   Observations/Objective: Patient sounds cheerful and well on the phone. I do not appreciate any SOB. Speech and thought processing are grossly intact. Patient reported vitals:  Assessment and Plan:  Cough  Nasal congestion  Fever, unspecified fever cause  -we discussed possible serious and likely etiologies, options for evaluation and workup, limitations of telemedicine visit vs in person visit, treatment, treatment risks and precautions. Pt prefers to treat via telemedicine empirically rather than in person at this moment.  Query COVID-19, influenza versus other.  Advised prompt COVID-19 testing and discussed testing  options, treatment options, potential complications and precautions given his high epic risk score of 5 and past medical history.  He agrees to pursue testing today.  I provided him with the Covid outpatient treatment center telephone number to call if he gets a positive test.  We discussed the possibility of influenza and the option of Tamiflu, he decided against this after discussion of benefits and risk.  He did want to cough medication and opted to try Tessalon.  We also discussed the possibility of a bacterial respiratory illness and he opted for doxycycline 100 mg twice daily for 7 to 10 days if any further fevers, worsening or discolored mucus or excessive sputum production.  Aside from that, discussed over-the-counter options for symptomatic care, precautions and advised prompt in person evaluation if worsening or any severe symptoms. Scheduled follow up with PCP offered: Declined  -but agrees to schedule follow-up if needed. Advised to seek prompt in person care if worsening, new symptoms arise, or if is not improving with treatment. Advised of options for inperson care in case PCP office not available. Did let the patient know that I only do telemedicine shifts for [facility] on Tuesdays and Thursdays and advised a follow up visit with PCP or at an [facility] if has further questions or concerns.   Follow Up Instructions:  I did not refer this patient for an OV with me in the next 24 hours for this/these issue(s).  I discussed the assessment and treatment plan with the patient. The patient was provided an opportunity to ask questions and all were answered. The patient agreed with the plan and demonstrated an understanding of the instructions.   I spent 15 minutes on the date of this visit in the  care of this patient. See summary of tasks completed to properly care for this patient in the detailed notes above which often include previsit review of PMH, medications, allergies, evaluation of the patient and  ordering and instructing patient on testing and care options.     Lucretia Kern, DO

## 2021-04-16 ENCOUNTER — Encounter: Payer: Self-pay | Admitting: Family Medicine

## 2021-04-16 ENCOUNTER — Other Ambulatory Visit: Payer: Self-pay

## 2021-04-16 ENCOUNTER — Telehealth (INDEPENDENT_AMBULATORY_CARE_PROVIDER_SITE_OTHER): Payer: Medicare Other | Admitting: Family Medicine

## 2021-04-16 DIAGNOSIS — R059 Cough, unspecified: Secondary | ICD-10-CM | POA: Diagnosis not present

## 2021-04-16 MED ORDER — DOXYCYCLINE HYCLATE 100 MG PO TABS: 100.0000 mg | ORAL_TABLET | Freq: Two times a day (BID) | ORAL | 0 refills | Status: DC

## 2021-04-16 NOTE — Patient Instructions (Signed)
-  I sent the medication(s) we discussed to your pharmacy: Meds ordered this encounter  Medications   doxycycline (VIBRA-TABS) 100 MG tablet    Sig: Take 1 tablet (100 mg total) by mouth 2 (two) times daily.    Dispense:  14 tablet    Refill:  0     I hope you are feeling better soon!  Seek in person care promptly if your symptoms worsen, new concerns arise or you are not improving with treatment.  It was nice to meet you today. I help [facility] out with telemedicine visits on Tuesdays and Thursdays and am available for visits on those days. If you have any concerns or questions following this visit please schedule a follow up visit with your Primary Care doctor or seek care at a local urgent care clinic to avoid delays in care.

## 2021-04-16 NOTE — Progress Notes (Signed)
Virtual Visit via Telephone Note  I connected with Connor Jehu Mccauslin Sr. on 04/16/21 at  5:40 PM EST by telephone and verified that I am speaking with the correct person using two identifiers.   I discussed the limitations, risks, security and privacy concerns of performing an evaluation and management service by telephone and the availability of in person appointments. I also discussed with the patient that there may be a patient responsible charge related to this service. The patient expressed understanding and agreed to proceed.  Location patient: home, [place] Location provider: work or home office Participants present for the call: patient, provider Patient did not have a visit with me in the prior 7 days to address this/these issue(s).   History of Present Illness:  Acute telemedicine visit for cough: -Onset: about 2-3 weeks ago -Symptoms include:congestion, cough - started as cold, persistent cough, some thick mucus, has required his albuterol about once daily -Denies:fevers, SOB, CP, NVD, inability to eat/drink, hemoptysis -Has tried: OTC cough medications -Pertinent past medical history:see below - has copd -Pertinent medication allergies: No Known Allergies -COVID-19 vaccine status:  Immunization History  Administered Date(s) Administered   Pneumococcal Polysaccharide-23 02/18/2019    Past Medical History:  Diagnosis Date   Anxiety    pt. denies   Arthritis    and back problems   Cancer ([place])    Bladder Ca 2013, surgically removed   COPD (chronic obstructive pulmonary disease) ([place])    History of kidney stones    once  2013 had surgery   Hypertension    Shortness of breath    with exertion       Observations/Objective: Patient sounds cheerful and well on the phone. I do not appreciate any SOB. Speech and thought processing are grossly intact. Patient reported vitals:  Assessment and Plan:  Cough, unspecified type  -we discussed possible serious and likely  etiologies, options for evaluation and workup, limitations of telemedicine visit vs in person visit, treatment, treatment risks and precautions. Pt prefers to treat via telemedicine empirically rather than in person at this moment. Given duration of symptoms with worsening cough and mucus production he preferred to try empiric tx with doxycycline. Declined prednisone or cough medication at this time.  Advised to seek prompt follow up with [facility] in person care if worsening, new symptoms arise, or if is not improving with treatment. Advised of options for inperson care in case PCP office not available. Did let the patient know that I only do telemedicine shifts for [facility] on Tuesdays and Thursdays and advised a follow up visit with PCP or at an [facility] if has further questions or concerns.   Follow Up Instructions:  I did not refer this patient for an OV with me in the next 24 hours for this/these issue(s).  I discussed the assessment and treatment plan with the patient. The patient was provided an opportunity to ask questions and all were answered. The patient agreed with the plan and demonstrated an understanding of the instructions.   I spent 15 minutes on the date of this visit in the care of this patient. See summary of tasks completed to properly care for this patient in the detailed notes above which also included counseling of above, review of PMH, medications, allergies, evaluation of the patient and ordering and/or  instructing patient on testing and care options.     Lucretia Kern, DO

## 2021-05-01 ENCOUNTER — Ambulatory Visit (HOSPITAL_COMMUNITY)
Admission: EM | Admit: 2021-05-01 | Discharge: 2021-05-01 | Disposition: A | Discharge status: Home / Self Care | Payer: Medicare Other | Attending: Physician Assistant | Admitting: Physician Assistant

## 2021-05-01 ENCOUNTER — Encounter (HOSPITAL_COMMUNITY): Payer: Self-pay | Admitting: Emergency Medicine

## 2021-05-01 ENCOUNTER — Other Ambulatory Visit: Payer: Self-pay

## 2021-05-01 ENCOUNTER — Ambulatory Visit (INDEPENDENT_AMBULATORY_CARE_PROVIDER_SITE_OTHER): Payer: Medicare Other

## 2021-05-01 DIAGNOSIS — J441 Chronic obstructive pulmonary disease with (acute) exacerbation: Secondary | ICD-10-CM

## 2021-05-01 DIAGNOSIS — R0602 Shortness of breath: Secondary | ICD-10-CM

## 2021-05-01 DIAGNOSIS — R062 Wheezing: Secondary | ICD-10-CM

## 2021-05-01 DIAGNOSIS — R059 Cough, unspecified: Secondary | ICD-10-CM

## 2021-05-01 MED ORDER — METHYLPREDNISOLONE SODIUM SUCC 125 MG IJ SOLR
INTRAMUSCULAR | Status: AC
  Filled 2021-05-01: qty 2

## 2021-05-01 MED ORDER — ALBUTEROL SULFATE HFA 108 (90 BASE) MCG/ACT IN AERS
INHALATION_SPRAY | RESPIRATORY_TRACT | Status: AC
  Filled 2021-05-01: qty 6.7

## 2021-05-01 MED ORDER — BUDESONIDE-FORMOTEROL FUMARATE 80-4.5 MCG/ACT IN AERO
2.0000 | INHALATION_SPRAY | Freq: Two times a day (BID) | RESPIRATORY_TRACT | 0 refills | Status: DC
Start: 2021-05-01 — End: 2021-06-14

## 2021-05-01 MED ORDER — PREDNISONE 10 MG (21) PO TBPK: ORAL_TABLET | ORAL | 0 refills | Status: DC

## 2021-05-01 MED ORDER — CEFDINIR 300 MG PO CAPS
300.0000 mg | ORAL_CAPSULE | Freq: Two times a day (BID) | ORAL | 0 refills | Status: DC
Start: 2021-05-01 — End: 2021-06-14

## 2021-05-01 MED ORDER — ALBUTEROL SULFATE HFA 108 (90 BASE) MCG/ACT IN AERS
4.0000 | INHALATION_SPRAY | Freq: Once | RESPIRATORY_TRACT | Status: AC
  Administered 2021-05-01: 4 via RESPIRATORY_TRACT

## 2021-05-01 MED ORDER — METHYLPREDNISOLONE SODIUM SUCC 125 MG IJ SOLR
60.0000 mg | Freq: Once | INTRAMUSCULAR | Status: AC
  Administered 2021-05-01: 60 mg via INTRAMUSCULAR

## 2021-05-01 NOTE — Discharge Instructions (Signed)
I believe that you have a COPD exacerbation.  Your x-ray was normal.  We gave you a shot of steroids today.  Please start prednisone taper tomorrow.  Do not take NSAIDs including aspirin, ibuprofen/Advil, naproxen/Aleve with this medication as cause stomach bleeding.  Start Omnicef twice daily to cover for infection.  It is very important that you use a maintenance medication.  Please start Symbicort.  You will likely need a higher dose of this and please follow-up with your PCP or pulmonologist within a week for medication adjustment.  If you have any persistent or worsening symptoms you need to be reevaluated.

## 2021-05-01 NOTE — ED Triage Notes (Signed)
Per med rec, PT was placed on doxycycline 11/8, took full course.

## 2021-05-01 NOTE — ED Triage Notes (Signed)
PT has COPD, he has had cough, congestion, shortness of breath for 3 weeks. He called his PCP and was placed on "a week of something" which he completed last week, has not had relief.

## 2021-05-01 NOTE — ED Provider Notes (Signed)
[facility]    CSN: [id number] Arrival date & time: 05/01/21  6295      History   Chief Complaint Chief Complaint  Patient presents with   Cough   Shortness of Breath    HPI Connor Jacarie Pate Sr. is a 75 y.o. male.   Patient presents today with 3-week history of productive cough and shortness of breath.  He reports history of COPD.  Was seen by his PCP with symptom onset and given doxycycline which she has since completed without improvement of symptoms.  He has been using albuterol inhaler regularly with minimal improvement of symptoms.  Denies any recent steroid use and denies history of diabetes.  He is a former smoker who quit at the time of COPD diagnosis.  He is prescribed a maintenance medication but is not taking this regularly.  Reports difficulty performing his daily activities as result of cough and wheezing symptoms.  Denies any fever, sore throat, body aches, chest pain.   Past Medical History:  Diagnosis Date   Anxiety    pt. denies   Arthritis    and back problems   Cancer ([place])    Bladder Ca 2013, surgically removed   COPD (chronic obstructive pulmonary disease) ([place])    History of kidney stones    once  2013 had surgery   Hypertension    Shortness of breath    with exertion    Patient Active Problem List   Diagnosis Date Noted   Sinusitis, chronic 10/07/2019   Hyponatremia 02/15/2019   Pyelonephritis 02/14/2019   Metatarsalgia of both feet 11/15/2017   Asthma 10/15/2017   Anxiety 10/01/2017   Acute sinusitis 08/14/2017   BLADDER CANCER 10/02/2007   Hyperlipidemia 10/02/2007   ALCOHOL ABUSE 10/02/2007   EROSIVE ESOPHAGITIS 10/02/2007   ESOPHAGEAL STENOSIS 10/02/2007   HIATAL HERNIA 10/02/2007   FATIGUE 10/02/2007   Essential hypertension 12/26/2006   GERD 12/26/2006   HEADACHE 12/26/2006    Past Surgical History:  Procedure Laterality Date   BACK SURGERY     lower   cancer removed     x 2   EYE SURGERY     bil cataracts    NASAL SEPTOPLASTY W/ TURBINOPLASTY Bilateral 10/07/2019   Procedure: NASAL SEPTOPLASTY WITH TURBINATE REDUCTION;  Surgeon: Jerrell Belfast, MD;  Location: [facility];  Service: ENT;  Laterality: Bilateral;   SINUS ENDO WITH FUSION Bilateral 10/07/2019   Procedure: SINUS ENDO WITH FUSION;  Surgeon: Jerrell Belfast, MD;  Location: [facility];  Service: ENT;  Laterality: Bilateral;   TRANSURETHRAL RESECTION OF BLADDER TUMOR N/A 11/16/2017   Procedure: TRANSURETHRAL RESECTION OF BLADDER TUMOR (TURBT);  Surgeon: Lucas Mallow, MD;  Location: [facility];  Service: Urology;  Laterality: N/A;   TRANSURETHRAL RESECTION OF BLADDER TUMOR WITH MITOMYCIN-C N/A 02/02/2019   Procedure: TRANSURETHRAL RESECTION OF BLADDER TUMOR WITH GEMCITABINE;  Surgeon: Lucas Mallow, MD;  Location: [facility];  Service: Urology;  Laterality: N/A;       Home Medications    Prior to Admission medications   Medication Sig Start Date End Date Taking? Authorizing Provider  budesonide-formoterol (SYMBICORT) 80-4.5 MCG/ACT inhaler Inhale 2 puffs into the lungs in the morning and at bedtime. 05/01/21  Yes Patrick Salemi K, PA-C  cefdinir (OMNICEF) 300 MG capsule Take 1 capsule (300 mg total) by mouth 2 (two) times daily. 05/01/21  Yes Wanetta Funderburke K, PA-C  predniSONE (STERAPRED UNI-PAK 21 TAB) 10 MG (21) TBPK tablet As directed 05/01/21  Yes  Maronda Caison K, PA-C  albuterol (VENTOLIN HFA) 108 (90 Base) MCG/ACT inhaler Inhale 2 puffs into the lungs every 6 (six) hours as needed for wheezing or shortness of breath. 02/23/19   Parrett, Fonnie Mu, NP  amLODipine (NORVASC) 10 MG tablet TAKE 1 TABLET BY MOUTH DAILY 04/20/20   Nafziger, Tommi Rumps, NP  atorvastatin (LIPITOR) 20 MG tablet TAKE 1 TABLET BY MOUTH BEFORE BEDTIME TO REDUCE CHOLESTEROL. 04/20/20   Nafziger, Tommi Rumps, NP  doxycycline (VIBRA-TABS) 100 MG tablet Take 1 tablet (100 mg total) by mouth 2 (two) times daily. 04/16/21   Lucretia Kern, DO  gabapentin (NEURONTIN) 300 MG capsule Take 1 capsule  (300 mg total) by mouth 2 (two) times daily. TAKE 1 CAPSULE(300 MG) BY MOUTH TWICE DAILY 04/20/20 07/19/20  Nafziger, Tommi Rumps, NP  ipratropium-albuterol (DUONEB) 0.5-2.5 (3) MG/3ML SOLN Take 3 mLs by nebulization every 6 (six) hours as needed. For shortness of breath 04/07/17   Rigoberto Noel, MD  losartan (COZAAR) 100 MG tablet TAKE 1 TABLET(100 MG) BY MOUTH DAILY 04/20/20   Dorothyann Peng, NP    Family History Family History  Problem Relation Age of Onset   Cancer Brother    Cancer Father    Cancer Sister     Social History Social History   Tobacco Use   Smoking status: Former    Packs/day: 1.00    Years: 50.00    Pack years: 50.00    Types: Cigarettes    Quit date: 09/21/2013    Years since quitting: 7.6   Smokeless tobacco: Never  Vaping Use   Vaping Use: Never used  Substance Use Topics   Alcohol use: No   Drug use: No     Allergies   Patient has no known allergies.   Review of Systems Review of Systems  Constitutional:  Positive for activity change. Negative for appetite change, fatigue and fever.  HENT:  Negative for congestion, sinus pressure, sneezing and sore throat.   Respiratory:  Positive for cough, chest tightness, shortness of breath and wheezing.   Cardiovascular:  Negative for chest pain.  Gastrointestinal:  Negative for abdominal pain, diarrhea, nausea and vomiting.  Neurological:  Negative for dizziness, light-headedness and headaches.    Physical Exam Triage Vital Signs ED Triage Vitals  Enc Vitals Group     BP 05/01/21 0929 (!) 160/81     Pulse Rate 05/01/21 0928 77     Resp 05/01/21 0928 20     Temp 05/01/21 0928 97.9 F (36.6 C)     Temp Source 05/01/21 0928 Oral     SpO2 05/01/21 0928 94 %     Weight --      Height --      Head Circumference --      Peak Flow --      Pain Score 05/01/21 0926 4     Pain Loc --      Pain Edu? --      Excl. in [place]? --    No data found.  Updated Vital Signs BP (!) 160/81   Pulse 77   Temp 97.9 F  (36.6 C) (Oral)   Resp 20   SpO2 94%   Visual Acuity Right Eye Distance:   Left Eye Distance:   Bilateral Distance:    Right Eye Near:   Left Eye Near:    Bilateral Near:     Physical Exam Vitals reviewed.  Constitutional:      General: He is awake.  Appearance: Normal appearance. He is well-developed. He is not ill-appearing.     Comments: Very pleasant male appears stated age in no acute distress sitting comfortably in exam room  HENT:     Head: Normocephalic and atraumatic.     Mouth/Throat:     Pharynx: Uvula midline. No oropharyngeal exudate, posterior oropharyngeal erythema or uvula swelling.  Cardiovascular:     Rate and Rhythm: Normal rate and regular rhythm.     Heart sounds: Normal heart sounds, S1 normal and S2 normal. No murmur heard. Pulmonary:     Effort: Pulmonary effort is normal.     Breath sounds: No stridor. Wheezing and rhonchi present. No rales.     Comments: Scattered wheezing and rhonchi. Abdominal:     General: Bowel sounds are normal.     Palpations: Abdomen is soft.     Tenderness: There is no abdominal tenderness.  Neurological:     Mental Status: He is alert.  Psychiatric:        Behavior: Behavior is cooperative.     UC Treatments / Results  Labs (all labs ordered are listed, but only abnormal results are displayed) Labs Reviewed - No data to display  EKG   Radiology DG Chest 2 View  Result Date: 05/01/2021 CLINICAL DATA:  Cough EXAM: CHEST - 2 VIEW COMPARISON:  Chest x-ray dated February 14, 2019 FINDINGS: Cardiac and mediastinal contours are unchanged and within normal limits. Lungs are clear. Unchanged blunting of the left costophrenic angle, likely due to pleural thickening. No evidence of pleural effusion or pneumothorax. IMPRESSION: No active cardiopulmonary disease. Electronically Signed   By: Yetta Glassman M.D.   On: 05/01/2021 09:52    Procedures Procedures (including critical care time)  Medications Ordered in  UC Medications  albuterol (VENTOLIN HFA) 108 (90 Base) MCG/ACT inhaler 4 puff (4 puffs Inhalation Provided for home use 05/01/21 1028)  methylPREDNISolone sodium succinate (SOLU-MEDROL) 125 mg/2 mL injection 60 mg (60 mg Intramuscular Given 05/01/21 1028)    Initial Impression / Assessment and Plan / UC Course  I have reviewed the triage vital signs and the nursing notes.  Pertinent labs & imaging results that were available during my care of the patient were reviewed by me and considered in my medical decision making (see chart for details).     Chest x-ray shows no acute disease.  Concern for COPD exacerbation given increased cough/sputum production in the setting of worsening wheezing.  Patient was given Solu-Medrol and albuterol in clinic with improvement but not resolution of symptoms.  He is not currently taking maintenance medication so we will start Symbicort we discussed that this will likely need to be increased so he needs to follow-up with his PCP or pulmonologist within a week for medication adjustment and reevaluation.  We will start [place] given he was recently treated with doxycycline to cover for infection.  We will start prednisone taper and patient was directed not to use NSAIDs with this medication due to risk of GI bleeding.  He can use albuterol as needed for additional symptom relief.  Discussed the importance of using medication as prescribed completing course of medication.  Discussed alarm symptoms that warrant emergent evaluation.  Strict return precautions given to which he expressed understanding.  Final Clinical Impressions(s) / UC Diagnoses   Final diagnoses:  COPD exacerbation ([place])  Shortness of breath  Wheezing     Discharge Instructions      I believe that you have a COPD exacerbation.  Your x-ray  was normal.  We gave you a shot of steroids today.  Please start prednisone taper tomorrow.  Do not take NSAIDs including aspirin, ibuprofen/Advil,  naproxen/Aleve with this medication as cause stomach bleeding.  Start Omnicef twice daily to cover for infection.  It is very important that you use a maintenance medication.  Please start Symbicort.  You will likely need a higher dose of this and please follow-up with your PCP or pulmonologist within a week for medication adjustment.  If you have any persistent or worsening symptoms you need to be reevaluated.     ED Prescriptions     Medication Sig Dispense Auth. Provider   predniSONE (STERAPRED UNI-PAK 21 TAB) 10 MG (21) TBPK tablet As directed 21 tablet Shanesha Bednarz K, PA-C   budesonide-formoterol (SYMBICORT) 80-4.5 MCG/ACT inhaler Inhale 2 puffs into the lungs in the morning and at bedtime. 10.2 g Shanie Mauzy K, PA-C   cefdinir (OMNICEF) 300 MG capsule Take 1 capsule (300 mg total) by mouth 2 (two) times daily. 20 capsule Arrie Zuercher, Derry Skill, PA-C      PDMP not reviewed this encounter.   Terrilee Croak, PA-C 05/01/21 1050

## 2021-06-14 ENCOUNTER — Other Ambulatory Visit: Payer: Self-pay | Admitting: Adult Health

## 2021-06-14 ENCOUNTER — Ambulatory Visit (INDEPENDENT_AMBULATORY_CARE_PROVIDER_SITE_OTHER): Payer: Medicare Other | Admitting: Adult Health

## 2021-06-14 ENCOUNTER — Encounter: Payer: Self-pay | Admitting: Adult Health

## 2021-06-14 VITALS — BP 162/90 | HR 92 | Temp 98.5°F | Ht 66.0 in | Wt 159.0 lb

## 2021-06-14 DIAGNOSIS — Z0000 Encounter for general adult medical examination without abnormal findings: Secondary | ICD-10-CM | POA: Diagnosis not present

## 2021-06-14 DIAGNOSIS — E785 Hyperlipidemia, unspecified: Secondary | ICD-10-CM

## 2021-06-14 DIAGNOSIS — G629 Polyneuropathy, unspecified: Secondary | ICD-10-CM | POA: Diagnosis not present

## 2021-06-14 DIAGNOSIS — R7303 Prediabetes: Secondary | ICD-10-CM | POA: Diagnosis not present

## 2021-06-14 DIAGNOSIS — I10 Essential (primary) hypertension: Secondary | ICD-10-CM

## 2021-06-14 DIAGNOSIS — J441 Chronic obstructive pulmonary disease with (acute) exacerbation: Secondary | ICD-10-CM

## 2021-06-14 DIAGNOSIS — F419 Anxiety disorder, unspecified: Secondary | ICD-10-CM

## 2021-06-14 DIAGNOSIS — Z125 Encounter for screening for malignant neoplasm of prostate: Secondary | ICD-10-CM

## 2021-06-14 DIAGNOSIS — C679 Malignant neoplasm of bladder, unspecified: Secondary | ICD-10-CM

## 2021-06-14 LAB — LIPID PANEL
Cholesterol: 152 mg/dL (ref 0–200)
HDL: 49 mg/dL (ref >39.00)
LDL Cholesterol: 67 mg/dL (ref 0–99)
NonHDL: 102.52
Total CHOL/HDL Ratio: 3
Triglycerides: 180 mg/dL — ABNORMAL HIGH (ref 0.0–149.0)
VLDL: 36 mg/dL (ref 0.0–40.0)

## 2021-06-14 LAB — CBC WITH DIFFERENTIAL/PLATELET
Basophils Absolute: 0.1 10*3/uL (ref 0.0–0.1)
Basophils Relative: 1.1 % (ref 0.0–3.0)
Eosinophils Absolute: 1.1 10*3/uL — ABNORMAL HIGH (ref 0.0–0.7)
Eosinophils Relative: 9.3 % — ABNORMAL HIGH (ref 0.0–5.0)
HCT: 47.6 % (ref 39.0–52.0)
Hemoglobin: 15.6 g/dL (ref 13.0–17.0)
Lymphocytes Relative: 15.7 % (ref 12.0–46.0)
Lymphs Abs: 1.8 10*3/uL (ref 0.7–4.0)
MCHC: 32.8 g/dL (ref 30.0–36.0)
MCV: 90.8 fl (ref 78.0–100.0)
Monocytes Absolute: 1.1 10*3/uL — ABNORMAL HIGH (ref 0.1–1.0)
Monocytes Relative: 9.5 % (ref 3.0–12.0)
Neutro Abs: 7.5 10*3/uL (ref 1.4–7.7)
Neutrophils Relative %: 64.4 % (ref 43.0–77.0)
Platelets: 395 10*3/uL (ref 150.0–400.0)
RBC: 5.24 Mil/uL (ref 4.22–5.81)
RDW: 15.4 % (ref 11.5–15.5)
WBC: 11.6 10*3/uL — ABNORMAL HIGH (ref 4.0–10.5)

## 2021-06-14 LAB — TSH: TSH: 3.78 u[IU]/mL (ref 0.35–5.50)

## 2021-06-14 LAB — COMPREHENSIVE METABOLIC PANEL
ALT: 16 U/L (ref 0–53)
AST: 17 U/L (ref 0–37)
Albumin: 4.6 g/dL (ref 3.5–5.2)
Alkaline Phosphatase: 94 U/L (ref 39–117)
BUN: 15 mg/dL (ref 6–23)
CO2: 24 mEq/L (ref 19–32)
Calcium: 10 mg/dL (ref 8.4–10.5)
Chloride: 101 mEq/L (ref 96–112)
Creatinine, Ser: 1.29 mg/dL (ref 0.40–1.50)
GFR: 53.73 mL/min — ABNORMAL LOW (ref >60.00)
Glucose, Bld: 107 mg/dL — ABNORMAL HIGH (ref 70–99)
Potassium: 4.3 mEq/L (ref 3.5–5.1)
Sodium: 136 mEq/L (ref 135–145)
Total Bilirubin: 0.6 mg/dL (ref 0.2–1.2)
Total Protein: 7.3 g/dL (ref 6.0–8.3)

## 2021-06-14 LAB — PSA: PSA: 1.93 ng/mL (ref 0.10–4.00)

## 2021-06-14 LAB — HEMOGLOBIN A1C: Hgb A1c MFr Bld: 6.5 % (ref 4.6–6.5)

## 2021-06-14 MED ORDER — ALPRAZOLAM 0.5 MG PO TABS: 0.5000 mg | ORAL_TABLET | Freq: Two times a day (BID) | ORAL | 0 refills | Status: AC

## 2021-06-14 MED ORDER — LOSARTAN POTASSIUM 100 MG PO TABS: ORAL_TABLET | ORAL | 3 refills | Status: AC

## 2021-06-14 MED ORDER — PREDNISONE 10 MG PO TABS: ORAL_TABLET | ORAL | 0 refills | Status: DC

## 2021-06-14 MED ORDER — AMLODIPINE BESYLATE 10 MG PO TABS: ORAL_TABLET | ORAL | 3 refills | Status: AC

## 2021-06-14 MED ORDER — ATORVASTATIN CALCIUM 20 MG PO TABS: ORAL_TABLET | ORAL | 3 refills | Status: AC

## 2021-06-14 MED ORDER — BUDESONIDE-FORMOTEROL FUMARATE 80-4.5 MCG/ACT IN AERO: 2.0000 | INHALATION_SPRAY | Freq: Two times a day (BID) | RESPIRATORY_TRACT | 0 refills | Status: DC

## 2021-06-14 MED ORDER — AZITHROMYCIN 250 MG PO TABS: ORAL_TABLET | ORAL | 0 refills | Status: DC

## 2021-06-14 MED ORDER — GABAPENTIN 300 MG PO CAPS: 300.0000 mg | ORAL_CAPSULE | Freq: Two times a day (BID) | ORAL | 1 refills | Status: AC

## 2021-06-14 NOTE — Progress Notes (Signed)
Subjective:    Patient ID: Connor Pontiff Sr., male    DOB: [birth year], 76 y.o.   MRN: [id number]  HPI Patient presents for yearly preventative medicine examination. He is a pleasant 76 year old male who  has a past medical history of Anxiety, Arthritis, Cancer ([place]), COPD (chronic obstructive pulmonary disease) ([place]), History of kidney stones, Hypertension, and Shortness of breath.  HTN -managed with Norvasc 10 mg and losartan 100 mg daily.  He denies dizziness, lightheadedness, chest pain, or shortness of breath. Has been out of his medications  BP Readings from Last 3 Encounters:  06/14/21 (!) 162/90  05/01/21 (!) 160/81  04/20/20 (!) 160/86   Hyperlipidemia -takes Lipitor 20 mg nightly.  He denies myalgia or fatigue Lab Results  Component Value Date   CHOL 250 (H) 04/20/2020   HDL 46 04/20/2020   LDLCALC 169 (H) 04/20/2020   LDLDIRECT 186.0 10/01/2017   TRIG 195 (H) 04/20/2020   CHOLHDL 5.4 (H) 04/20/2020   COPD-is managed by pulmonary. Prescribed Symbicort but does not use it daily.  Does have albuterol rescue inhaler and nebulizer but uses these infrequently.  Had a COPD exacerbation in November 2022.  Reports that his symptoms have returned over the last few weeks.  Has been experiencing shortness of breath, wheezing, chest congestion and cough.  Feels as though "I cannot breathe when I lay down".  Lower extremity neuropathy-managed with gabapentin 300 mg twice daily.  He does feel as though this dose works well for him  History of bladder cancer-has had 3 resections in the past with the most recent being in August 2020.  He is followed by urology but overdue for appointment, as is his last screening for scope.  Over February 2021.  Glucose intolerance-not currently on any medication Lab Results  Component Value Date   HGBA1C 6.0 (H) 04/20/2020   Anxiety/Insomnia -he reports that his son was shot and killed in August 2022.  There has not been any arrest and has monitor.   Patient reports that since his monitor he is felt more anxious and is unable to sleep.  He is upset that the [place] has not been able to solve the case.  All immunizations and health maintenance protocols were reviewed with the patient and needed orders were placed. Refuses vaccinations  Appropriate screening laboratory values were ordered for the patient including screening of hyperlipidemia, renal function and hepatic function. If indicated by BPH, a PSA was ordered.  Medication reconciliation,  past medical history, social history, problem list and allergies were reviewed in detail with the patient  Goals were established with regard to weight loss, exercise, and  diet in compliance with medications  Wt Readings from Last 3 Encounters:  06/14/21 159 lb (72.1 kg)  04/20/20 166 lb 12.8 oz (75.7 kg)  01/04/20 161 lb (73 kg)   Review of Systems  Constitutional: Negative.   HENT: Negative.    Eyes: Negative.   Respiratory:  Positive for cough, chest tightness, shortness of breath and wheezing.   Cardiovascular: Negative.   Gastrointestinal: Negative.   Endocrine: Negative.   Genitourinary: Negative.   Musculoskeletal: Negative.   Skin: Negative.   Allergic/Immunologic: Negative.   Neurological:  Positive for numbness.  Hematological: Negative.   Psychiatric/Behavioral:  Positive for agitation and sleep disturbance. The patient is nervous/anxious.   All other systems reviewed and are negative.  Past Medical History:  Diagnosis Date   Anxiety    pt. denies   Arthritis  and back problems   Cancer [facility])    Bladder Ca 2013, surgically removed   COPD (chronic obstructive pulmonary disease) ([place])    History of kidney stones    once  2013 had surgery   Hypertension    Shortness of breath    with exertion    Social History   Socioeconomic History   Marital status: Married    Spouse name: Not on file   Number of children: 5   Years of education: 6    Highest education level: Not on file  Occupational History   Not on file  Tobacco Use   Smoking status: Former    Packs/day: 1.00    Years: 50.00    Pack years: 50.00    Types: Cigarettes    Quit date: 09/21/2013    Years since quitting: 7.7   Smokeless tobacco: Never  Vaping Use   Vaping Use: Never used  Substance and Sexual Activity   Alcohol use: No   Drug use: No   Sexual activity: Yes    Birth control/protection: None  Other Topics Concern   Not on file  Social History Narrative   Worked as a Games developer - since retired.    Social Determinants of Health   Financial Resource Strain: Not on file  Food Insecurity: Not on file  Transportation Needs: Not on file  Physical Activity: Not on file  Stress: Not on file  Social Connections: Not on file  Intimate Partner Violence: Not on file    Past Surgical History:  Procedure Laterality Date   BACK SURGERY     lower   cancer removed     x 2   EYE SURGERY     bil cataracts   NASAL SEPTOPLASTY W/ TURBINOPLASTY Bilateral 10/07/2019   Procedure: NASAL SEPTOPLASTY WITH TURBINATE REDUCTION;  Surgeon: Jerrell Belfast, MD;  Location: [place];  Service: ENT;  Laterality: Bilateral;   SINUS ENDO WITH FUSION Bilateral 10/07/2019   Procedure: SINUS ENDO WITH FUSION;  Surgeon: Jerrell Belfast, MD;  Location: [place];  Service: ENT;  Laterality: Bilateral;   TRANSURETHRAL RESECTION OF BLADDER TUMOR N/A 11/16/2017   Procedure: TRANSURETHRAL RESECTION OF BLADDER TUMOR (TURBT);  Surgeon: Lucas Mallow, MD;  [facility];  Service: Urology;  Laterality: N/A;   TRANSURETHRAL RESECTION OF BLADDER TUMOR WITH MITOMYCIN-C N/A 02/02/2019   Procedure: TRANSURETHRAL RESECTION OF BLADDER TUMOR WITH GEMCITABINE;  Surgeon: Lucas Mallow, MD;  Location[facility];  Service: Urology;  Laterality: N/A;    Family History  Problem Relation Age of Onset   Cancer Brother    Cancer Father    Cancer Sister     No Known Allergies  Current  Outpatient Medications on File Prior to Visit  Medication Sig Dispense Refill   albuterol (VENTOLIN HFA) 108 (90 Base) MCG/ACT inhaler Inhale 2 puffs into the lungs every 6 (six) hours as needed for wheezing or shortness of breath. 8 g 5   atorvastatin (LIPITOR) 20 MG tablet TAKE 1 TABLET BY MOUTH BEFORE BEDTIME TO REDUCE CHOLESTEROL. 90 tablet 3   ipratropium-albuterol (DUONEB) 0.5-2.5 (3) MG/3ML SOLN Take 3 mLs by nebulization every 6 (six) hours as needed. For shortness of breath 180 mL 3   No current facility-administered medications on file prior to visit.    BP (!) 162/90    Pulse 92    Temp 98.5 F (36.9 C) (Oral)    Ht 5\' 6"  (1.676 m)    Wt 159 lb (  72.1 kg)    SpO2 95%    BMI 25.66 kg/m        Objective:   Physical Exam Vitals and nursing note reviewed.  Constitutional:      General: He is not in acute distress.    Appearance: Normal appearance. He is well-developed and normal weight.  HENT:     Head: Normocephalic and atraumatic.     Right Ear: Tympanic membrane, ear canal and external ear normal. There is no impacted cerumen.     Left Ear: Tympanic membrane, ear canal and external ear normal. There is no impacted cerumen.     Nose: Nose normal. No congestion or rhinorrhea.     Mouth/Throat:     Mouth: Mucous membranes are moist.     Pharynx: Oropharynx is clear. No oropharyngeal exudate or posterior oropharyngeal erythema.  Eyes:     General:        Right eye: No discharge.        Left eye: No discharge.     Extraocular Movements: Extraocular movements intact.     Conjunctiva/sclera: Conjunctivae normal.     Pupils: Pupils are equal, round, and reactive to light.  Neck:     Vascular: No carotid bruit.     Trachea: No tracheal deviation.  Cardiovascular:     Rate and Rhythm: Normal rate and regular rhythm.     Pulses: Normal pulses.     Heart sounds: Normal heart sounds. No murmur heard.   No friction rub. No gallop.  Pulmonary:     Effort: Pulmonary effort is  normal. No respiratory distress.     Breath sounds: Normal breath sounds. Decreased air movement present. No stridor. No decreased breath sounds, wheezing, rhonchi or rales.  Chest:     Chest wall: No tenderness.  Abdominal:     General: Bowel sounds are normal. There is no distension.     Palpations: Abdomen is soft. There is no mass.     Tenderness: There is no abdominal tenderness. There is no right CVA tenderness, left CVA tenderness, guarding or rebound.     Hernia: No hernia is present.  Musculoskeletal:        General: No swelling, tenderness, deformity or signs of injury. Normal range of motion.     Right lower leg: No edema.     Left lower leg: No edema.  Lymphadenopathy:     Cervical: No cervical adenopathy.  Skin:    General: Skin is warm and dry.     Capillary Refill: Capillary refill takes less than 2 seconds.     Coloration: Skin is not jaundiced or pale.     Findings: No bruising, erythema, lesion or rash.  Neurological:     General: No focal deficit present.     Mental Status: He is alert and oriented to person, place, and time.     Cranial Nerves: No cranial nerve deficit.     Sensory: No sensory deficit.     Motor: No weakness.     Coordination: Coordination normal.     Gait: Gait normal.     Deep Tendon Reflexes: Reflexes normal.  Psychiatric:        Mood and Affect: Mood normal.        Behavior: Behavior normal.        Thought Content: Thought content normal.        Judgment: Judgment normal.      Assessment & Plan:  1. Routine general medical examination at a health  care facility  - CBC with Differential/Platelet; Future - Comprehensive metabolic panel; Future - Hemoglobin A1c; Future - Lipid panel; Future - TSH; Future - CBC with Differential/Platelet - Comprehensive metabolic panel - Hemoglobin A1c - Lipid panel - TSH  2. Hyperlipidemia, unspecified hyperlipidemia type - Consider increase in statin  - CBC with Differential/Platelet; Future -  Comprehensive metabolic panel; Future - Hemoglobin A1c; Future - Lipid panel; Future - TSH; Future - CBC with Differential/Platelet - Comprehensive metabolic panel - Hemoglobin A1c - Lipid panel - TSH  3. Essential hypertension - Will refill medications - Encouraged to check BP at home  - CBC with Differential/Platelet; Future - Comprehensive metabolic panel; Future - Hemoglobin A1c; Future - Lipid panel; Future - TSH; Future - losartan (COZAAR) 100 MG tablet; TAKE 1 TABLET(100 MG) BY MOUTH DAILY  Dispense: 90 tablet; Refill: 3 - amLODipine (NORVASC) 10 MG tablet; TAKE 1 TABLET BY MOUTH DAILY  Dispense: 90 tablet; Refill: 3 - CBC with Differential/Platelet - Comprehensive metabolic panel - Hemoglobin A1c - Lipid panel - TSH  4. Pre-diabetes - Consider adding metformin  - CBC with Differential/Platelet; Future - Comprehensive metabolic panel; Future - Hemoglobin A1c; Future - Lipid panel; Future - TSH; Future - CBC with Differential/Platelet - Comprehensive metabolic panel - Hemoglobin A1c - Lipid panel - TSH  5. Malignant neoplasm of urinary bladder, unspecified site [facility]) - Follow up with urology   6. Prostate cancer screening  - PSA; Future - PSA  7. COPD with acute exacerbation ([facility]) -Breath sounds tight but no wheezing today.  We will treat with oral myosin and prednisone taper.  Advised the importance of using his daily inhaler as directed.  We will send in refill of Symbicort until he can be seen by pulmonary - CBC with Differential/Platelet; Future - Comprehensive metabolic panel; Future - Hemoglobin A1c; Future - Lipid panel; Future - TSH; Future - budesonide-formoterol (SYMBICORT) 80-4.5 MCG/ACT inhaler; Inhale 2 puffs into the lungs in the morning and at bedtime.  Dispense: 10.2 g; Refill: 0 - azithromycin (ZITHROMAX Z-PAK) 250 MG tablet; Take 2 tablets on Day 1.  Then take 1 tablet daily.  Dispense: 6 tablet; Refill: 0 - predniSONE (DELTASONE) 10 MG  tablet; 40 mg x 3 days, 20 mg x 3 days, 10 mg x 3 days  Dispense: 21 tablet; Refill: 0 - CBC with Differential/Platelet - Comprehensive metabolic panel - Hemoglobin A1c - Lipid panel - TSH  8. Neuropathy  - gabapentin (NEURONTIN) 300 MG capsule; Take 1 capsule (300 mg total) by mouth 2 (two) times daily. TAKE 1 CAPSULE(300 MG) BY MOUTH TWICE DAILY  Dispense: 180 capsule; Refill: 1  9. Anxiety -My condolences were given.  We will provide a short course of Xanax to help with anxiety and sleeplessness.  Advise follow-up if continued use as needed - ALPRAZolam (XANAX) 0.5 MG tablet; Take 1 tablet (0.5 mg total) by mouth 2 (two) times daily for 15 days.  Dispense: 30 tablet; Refill: 0  Dorothyann Peng, NP

## 2021-06-14 NOTE — Patient Instructions (Signed)
It was great seeing you today   We will follow up with you regarding your lab work   Please let me know if you need anything   I have sent in prednisone and azithromycin for the COPD flare as well as a short course of Xanax to help with anxiety and sleeplessness  Make sure you are using Symbicort every day

## 2021-06-19 ENCOUNTER — Telehealth: Payer: Self-pay | Admitting: Adult Health

## 2021-06-19 NOTE — Telephone Encounter (Signed)
Pt daughter Charleston Ropes is returning call concerning her dad blood work results

## 2021-06-20 ENCOUNTER — Other Ambulatory Visit: Payer: Self-pay

## 2021-06-20 ENCOUNTER — Telehealth: Payer: Self-pay | Admitting: Adult Health

## 2021-06-20 MED ORDER — METFORMIN HCL 500 MG PO TABS: 500.0000 mg | ORAL_TABLET | Freq: Every day | ORAL | 3 refills | Status: AC

## 2021-06-20 NOTE — Telephone Encounter (Signed)
Pt wife call and stated she miss your call and want a call back about his result/mm

## 2021-06-21 NOTE — Telephone Encounter (Signed)
Please see Mychart message.

## 2021-07-02 ENCOUNTER — Other Ambulatory Visit: Payer: Self-pay | Admitting: Adult Health

## 2021-07-12 ENCOUNTER — Ambulatory Visit: Payer: Medicare Other | Admitting: Pulmonary Disease

## 2021-07-22 ENCOUNTER — Other Ambulatory Visit: Payer: Self-pay | Admitting: Adult Health

## 2021-07-22 DIAGNOSIS — J441 Chronic obstructive pulmonary disease with (acute) exacerbation: Secondary | ICD-10-CM

## 2021-08-19 ENCOUNTER — Other Ambulatory Visit: Payer: Self-pay

## 2021-08-19 ENCOUNTER — Encounter: Payer: Self-pay | Admitting: Pulmonary Disease

## 2021-08-19 ENCOUNTER — Ambulatory Visit: Payer: Medicare Other | Admitting: Pulmonary Disease

## 2021-08-19 DIAGNOSIS — I10 Essential (primary) hypertension: Secondary | ICD-10-CM | POA: Diagnosis not present

## 2021-08-19 DIAGNOSIS — J449 Chronic obstructive pulmonary disease, unspecified: Secondary | ICD-10-CM | POA: Diagnosis not present

## 2021-08-19 MED ORDER — BREZTRI AEROSPHERE 160-9-4.8 MCG/ACT IN AERO
2.0000 | INHALATION_SPRAY | Freq: Two times a day (BID) | RESPIRATORY_TRACT | 0 refills | Status: AC
Start: 2021-08-19 — End: ?

## 2021-08-19 NOTE — Assessment & Plan Note (Signed)
He has been maintained on Symbicort but had a recent flareup. ?We will need to step up to triple therapy ?I gave him a sample of Breztri today, he will check on cost and effect and based on this we will decide to continue ?He will continue to use albuterol for rescue. ?We discussed signs and symptoms of COPD/asthma exacerbation and action plan ?

## 2021-08-19 NOTE — Assessment & Plan Note (Signed)
Blood pressure high today. ?He will continue amlodipine and losartan and recheck with PCP ?

## 2021-08-19 NOTE — Progress Notes (Signed)
? ?  Subjective:  ? ? Patient ID: Connor Pontiff Sr., male    DOB: Oct 17, 1945, 76 y.o.   MRN: [id number] ? ?HPI ? ?76 yo ex-smoker Presents for FU of COPD/asthma. ?He smoked a pack a day since the age of 15-about 40 pack years   ?Reversibility of FEV1 suggesting larger component of asthma rather than COPD ?  ?PMH - bladder cancer status post transurethral resection of the bladder tumor and intravesicular instillation of chemotherapy 01/2019, hypertension, chronic kidney disease stage [name] ? ?Last office visit was in 2021, he returns for follow-up after 2 years.  He has been seeing his PCP. ?Recently had a COPD flareup and required Z-Pak and prednisone, now back to baseline. ?Chest x-ray 04/2021 was reviewed which shows hyperinflation without infiltrates or effusions ?He is compliant with Symbicort, uses albuterol MDI as needed really has not needed nebs ? ? ?Significant tests/ events reviewed ? ?CT chest angio 02/2019 neg PE ?Spirometry 11/2013  FEV1 43% - 1.31 - with ratio 53 ?Post bronchodilator, FEV1 improved to 1.56 -significant bronchodilator response.  ?  ?Arlyce Harman 10/2014 FEV 1 69%  ?  ?08/2017 Spirometry- FEV1 at 82%, ratio 63, FVC 95% ?IgE 216, RAST neg ? ? ?Review of Systems ?neg for any significant sore throat, dysphagia, itching, sneezing, nasal congestion or excess/ purulent secretions, fever, chills, sweats, unintended wt loss, pleuritic or exertional cp, hempoptysis, orthopnea pnd or change in chronic leg swelling. Also denies presyncope, palpitations, heartburn, abdominal pain, nausea, vomiting, diarrhea or change in bowel or urinary habits, dysuria,hematuria, rash, arthralgias, visual complaints, headache, numbness weakness or ataxia. ? ?   ?Objective:  ? Physical Exam ? ?Gen. Pleasant, elderly, well-nourished, in no distress ?ENT - no thrush, no pallor/icterus,no post nasal drip ?Neck: No JVD, no thyromegaly, no carotid bruits ?Lungs: no use of accessory muscles, no dullness to percussion, decreased   without rales or rhonchi  ?Cardiovascular: Rhythm regular, heart sounds  normal, no murmurs or gallops, no peripheral edema ?Musculoskeletal: No deformities, no cyanosis or clubbing  ? ? ? ?   ?Assessment & Plan:  ? ? ?

## 2021-08-19 NOTE — Patient Instructions (Signed)
?  X Sample of Breztri - 2 puffs twice daily  & Rx  ? ? ?

## 2021-08-19 NOTE — Addendum Note (Signed)
Addended by: Fran Lowes on: 08/19/2021 03:12 PM ? ? Modules accepted: Orders ? ?

## 2021-10-15 ENCOUNTER — Ambulatory Visit (INDEPENDENT_AMBULATORY_CARE_PROVIDER_SITE_OTHER): Payer: Medicare Other | Admitting: Adult Health

## 2021-10-15 ENCOUNTER — Encounter: Payer: Self-pay | Admitting: Adult Health

## 2021-10-15 VITALS — BP 140/80 | HR 88 | Temp 98.4°F | Ht 66.0 in | Wt 163.0 lb

## 2021-10-15 DIAGNOSIS — L309 Dermatitis, unspecified: Secondary | ICD-10-CM | POA: Diagnosis not present

## 2021-10-15 DIAGNOSIS — J339 Nasal polyp, unspecified: Secondary | ICD-10-CM | POA: Diagnosis not present

## 2021-10-15 MED ORDER — TRIAMCINOLONE ACETONIDE 0.5 % EX CREA: 1.0000 "application " | TOPICAL_CREAM | Freq: Two times a day (BID) | CUTANEOUS | 0 refills | Status: AC

## 2021-10-15 NOTE — Patient Instructions (Signed)
The rash on your hands is called Eczema. I have sent in a strong steroid called Kenalog. Use this twice a day  ? ?Also pick up [employer] working hands and use that throughout the day  ?

## 2021-10-15 NOTE — Progress Notes (Signed)
? ?Subjective:  ? ? Patient ID: Connor Pontiff Sr., male    DOB: [birth year], 76 y.o.   MRN: [id number] ? ?HPI ? ?76 year old male who  has a past medical history of Anxiety, Arthritis, Cancer ([place]), COPD (chronic obstructive pulmonary disease) ([place]), History of kidney stones, Hypertension, and Shortness of breath. ? ?He presents to the office today for 2 separate issues. ? ?First issue is that of a "rash" on bilateral hands.  Reports that over the last month he has developed rough, dry, itchy skin that cracks and become sore and bleeds.  He has tried various over-the-counter hand lotions without improvement.  He denies using any chemicals and cannot think of any substance that he would have come in contact with the cause of dermatitis. ? ?Distantly, he reports that over the last few months he has been experiencing intermittent nosebleeds out of his left nare.  Nosebleeds only last a few minutes and then they are able to be stopped.  He is not using any nasal sprays currently ? ?Review of Systems ?See HPI  ? ?Past Medical History:  ?Diagnosis Date  ? Anxiety   ? pt. denies  ? Arthritis   ? and back problems  ? Cancer [facility])   ? Bladder Ca 2013, surgically removed  ? COPD (chronic obstructive pulmonary disease) ([place])   ? History of kidney stones   ? once  2013 had surgery  ? Hypertension   ? Shortness of breath   ? with exertion  ? ? ?Social History  ? ?Socioeconomic History  ? Marital status: Married  ?  Spouse name: Not on file  ? Number of children: 5  ? Years of education: 6  ? Highest education level: Not on file  ?Occupational History  ? Not on file  ?Tobacco Use  ? Smoking status: Former  ?  Packs/day: 1.00  ?  Years: 50.00  ?  Pack years: 50.00  ?  Types: Cigarettes  ?  Quit date: 09/21/2013  ?  Years since quitting: 8.0  ? Smokeless tobacco: Never  ?Vaping Use  ? Vaping Use: Never used  ?Substance and Sexual Activity  ? Alcohol use: No  ? Drug use: No  ? Sexual activity: Yes  ?  Birth control/protection: None   ?Other Topics Concern  ? Not on file  ?Social History Narrative  ? Worked as a Games developer - since retired.   ? ?Social Determinants of Health  ? ?Financial Resource Strain: Not on file  ?Food Insecurity: Not on file  ?Transportation Needs: Not on file  ?Physical Activity: Not on file  ?Stress: Not on file  ?Social Connections: Not on file  ?Intimate Partner Violence: Not on file  ? ? ?Past Surgical History:  ?Procedure Laterality Date  ? BACK SURGERY    ? lower  ? cancer removed    ? x 2  ? EYE SURGERY    ? bil cataracts  ? NASAL SEPTOPLASTY W/ TURBINOPLASTY Bilateral 10/07/2019  ? Procedure: NASAL SEPTOPLASTY WITH TURBINATE REDUCTION;  Surgeon: Jerrell Belfast, MD;  Location: [place];  Service: ENT;  Laterality: Bilateral;  ? SINUS ENDO WITH FUSION Bilateral 10/07/2019  ? Procedure: SINUS ENDO WITH FUSION;  Surgeon: Jerrell Belfast, MD;  Location: [place];  Service: ENT;  Laterality: Bilateral;  ? TRANSURETHRAL RESECTION OF BLADDER TUMOR N/A 11/16/2017  ? Procedure: TRANSURETHRAL RESECTION OF BLADDER TUMOR (TURBT);  Surgeon: Lucas Mallow, MD;  [facility];  Service: Urology;  Laterality: N/A;  ?  TRANSURETHRAL RESECTION OF BLADDER TUMOR WITH MITOMYCIN-C N/A 02/02/2019  ? Procedure: TRANSURETHRAL RESECTION OF BLADDER TUMOR WITH GEMCITABINE;  Surgeon: Lucas Mallow, MD;  [facility];  Service: Urology;  Laterality: N/A;  ? ? ?Family History  ?Problem Relation Age of Onset  ? Cancer Brother   ? Cancer Father   ? Cancer Sister   ? ? ?No Known Allergies ? ?Current Outpatient Medications on File Prior to Visit  ?Medication Sig Dispense Refill  ? albuterol (VENTOLIN HFA) 108 (90 Base) MCG/ACT inhaler Inhale 2 puffs into the lungs every 6 (six) hours as needed for wheezing or shortness of breath. 8 g 5  ? amLODipine (NORVASC) 10 MG tablet TAKE 1 TABLET BY MOUTH DAILY 90 tablet 3  ? atorvastatin (LIPITOR) 20 MG tablet TAKE 1 TABLET BY MOUTH BEFORE BEDTIME TO REDUCE CHOLESTEROL. 90 tablet 3  ?  Budeson-Glycopyrrol-Formoterol (BREZTRI AEROSPHERE) 160-9-4.8 MCG/ACT AERO Inhale 2 puffs into the lungs in the morning and at bedtime. 10.7 g 0  ? budesonide-formoterol (SYMBICORT) 80-4.5 MCG/ACT inhaler INHALE 2 PUFFS INTO THE LUNGS IN THE MORNING AND 2 PUFFS AT BEDTIME 10.2 g 0  ? gabapentin (NEURONTIN) 300 MG capsule Take 1 capsule (300 mg total) by mouth 2 (two) times daily. TAKE 1 CAPSULE(300 MG) BY MOUTH TWICE DAILY 180 capsule 1  ? ipratropium-albuterol (DUONEB) 0.5-2.5 (3) MG/3ML SOLN Take 3 mLs by nebulization every 6 (six) hours as needed. For shortness of breath 180 mL 3  ? losartan (COZAAR) 100 MG tablet TAKE 1 TABLET(100 MG) BY MOUTH DAILY 90 tablet 3  ? metFORMIN (GLUCOPHAGE) 500 MG tablet Take 1 tablet (500 mg total) by mouth daily with breakfast. 180 tablet 3  ? ?No current facility-administered medications on file prior to visit.  ? ? ?BP 140/80   Pulse 88   Temp 98.4 ?F (36.9 ?C) (Oral)   Ht '5\' 6"'$  (1.676 m)   Wt 163 lb (73.9 kg)   SpO2 95%   BMI 26.31 kg/m?  ? ? ?   ?Objective:  ? Physical Exam ?Vitals and nursing note reviewed.  ?Constitutional:   ?   Appearance: Normal appearance.  ?HENT:  ?   Nose:  ?   Comments: Polyp noted in the left nare ?Skin: ?   General: Skin is warm and dry.  ?   Capillary Refill: Capillary refill takes less than 2 seconds.  ?   Comments: Rough, dry, irritated skin throughout bilateral hands and fingers.  There are are areas of cracking noted.  ?Neurological:  ?   General: No focal deficit present.  ?   Mental Status: He is alert and oriented to person, place, and time.  ?Psychiatric:     ?   Mood and Affect: Mood normal.     ?   Behavior: Behavior normal.     ?   Thought Content: Thought content normal.     ?   Judgment: Judgment normal.  ? ? ?   ?Assessment & Plan:  ?1. Eczema, unspecified type ?- triamcinolone cream (KENALOG) 0.5 %; Apply 1 application. topically 2 (two) times daily.  Dispense: 454 g; Refill: 0 ?-We will have him use Kenalog in conjunction with  [employer] working hands lotion.  He can stop the Kenalog once the rash has resolved. ?-Follow-up if not improving over the next week or 2 ? ?2. Nasal polyp ?-Advise referral to ear nose and throat.  Patient refused at this time ? ?Dorothyann Peng, NP ? ? ? ? ? ?

## 2022-02-24 DIAGNOSIS — L308 Other specified dermatitis: Secondary | ICD-10-CM | POA: Diagnosis not present

## 2022-03-10 DIAGNOSIS — L308 Other specified dermatitis: Secondary | ICD-10-CM | POA: Diagnosis not present

## 2022-04-07 DIAGNOSIS — L308 Other specified dermatitis: Secondary | ICD-10-CM | POA: Diagnosis not present

## 2022-05-05 DIAGNOSIS — L258 Unspecified contact dermatitis due to other agents: Secondary | ICD-10-CM | POA: Diagnosis not present

## 2022-05-09 DIAGNOSIS — L258 Unspecified contact dermatitis due to other agents: Secondary | ICD-10-CM | POA: Diagnosis not present

## 2022-05-14 ENCOUNTER — Ambulatory Visit (HOSPITAL_BASED_OUTPATIENT_CLINIC_OR_DEPARTMENT_OTHER): Payer: Medicare Other | Admitting: Pulmonary Disease

## 2022-06-01 ENCOUNTER — Emergency Department (HOSPITAL_COMMUNITY): Payer: Medicare PPO

## 2022-06-01 ENCOUNTER — Inpatient Hospital Stay (HOSPITAL_COMMUNITY)
Admission: EM | Admit: 2022-06-01 | Discharge: 2022-07-10 | DRG: 207 | Disposition: E | Payer: Medicare PPO | Attending: General Surgery | Admitting: General Surgery

## 2022-06-01 DIAGNOSIS — Z8551 Personal history of malignant neoplasm of bladder: Secondary | ICD-10-CM

## 2022-06-01 DIAGNOSIS — Z87442 Personal history of urinary calculi: Secondary | ICD-10-CM

## 2022-06-01 DIAGNOSIS — M1990 Unspecified osteoarthritis, unspecified site: Secondary | ICD-10-CM | POA: Diagnosis present

## 2022-06-01 DIAGNOSIS — Z66 Do not resuscitate: Secondary | ICD-10-CM | POA: Diagnosis not present

## 2022-06-01 DIAGNOSIS — I4891 Unspecified atrial fibrillation: Secondary | ICD-10-CM | POA: Diagnosis not present

## 2022-06-01 DIAGNOSIS — Z4682 Encounter for fitting and adjustment of non-vascular catheter: Secondary | ICD-10-CM | POA: Diagnosis not present

## 2022-06-01 DIAGNOSIS — R402422 Glasgow coma scale score 9-12, at arrival to emergency department: Secondary | ICD-10-CM | POA: Diagnosis present

## 2022-06-01 DIAGNOSIS — R918 Other nonspecific abnormal finding of lung field: Secondary | ICD-10-CM | POA: Diagnosis not present

## 2022-06-01 DIAGNOSIS — R079 Chest pain, unspecified: Secondary | ICD-10-CM | POA: Diagnosis not present

## 2022-06-01 DIAGNOSIS — I672 Cerebral atherosclerosis: Secondary | ICD-10-CM | POA: Diagnosis not present

## 2022-06-01 DIAGNOSIS — Z23 Encounter for immunization: Secondary | ICD-10-CM

## 2022-06-01 DIAGNOSIS — J939 Pneumothorax, unspecified: Secondary | ICD-10-CM | POA: Diagnosis present

## 2022-06-01 DIAGNOSIS — F1010 Alcohol abuse, uncomplicated: Secondary | ICD-10-CM | POA: Diagnosis not present

## 2022-06-01 DIAGNOSIS — Z9911 Dependence on respirator [ventilator] status: Secondary | ICD-10-CM

## 2022-06-01 DIAGNOSIS — Z1152 Encounter for screening for COVID-19: Secondary | ICD-10-CM

## 2022-06-01 DIAGNOSIS — E8770 Fluid overload, unspecified: Secondary | ICD-10-CM | POA: Diagnosis not present

## 2022-06-01 DIAGNOSIS — J3489 Other specified disorders of nose and nasal sinuses: Secondary | ICD-10-CM | POA: Diagnosis not present

## 2022-06-01 DIAGNOSIS — S270XXA Traumatic pneumothorax, initial encounter: Secondary | ICD-10-CM | POA: Diagnosis not present

## 2022-06-01 DIAGNOSIS — K219 Gastro-esophageal reflux disease without esophagitis: Secondary | ICD-10-CM | POA: Diagnosis present

## 2022-06-01 DIAGNOSIS — Z79899 Other long term (current) drug therapy: Secondary | ICD-10-CM

## 2022-06-01 DIAGNOSIS — R4182 Altered mental status, unspecified: Secondary | ICD-10-CM | POA: Diagnosis not present

## 2022-06-01 DIAGNOSIS — J90 Pleural effusion, not elsewhere classified: Secondary | ICD-10-CM | POA: Diagnosis not present

## 2022-06-01 DIAGNOSIS — J9601 Acute respiratory failure with hypoxia: Secondary | ICD-10-CM | POA: Diagnosis not present

## 2022-06-01 DIAGNOSIS — S0001XA Abrasion of scalp, initial encounter: Secondary | ICD-10-CM | POA: Diagnosis present

## 2022-06-01 DIAGNOSIS — N329 Bladder disorder, unspecified: Secondary | ICD-10-CM | POA: Diagnosis present

## 2022-06-01 DIAGNOSIS — S2242XA Multiple fractures of ribs, left side, initial encounter for closed fracture: Secondary | ICD-10-CM | POA: Diagnosis not present

## 2022-06-01 DIAGNOSIS — S3993XA Unspecified injury of pelvis, initial encounter: Secondary | ICD-10-CM | POA: Diagnosis not present

## 2022-06-01 DIAGNOSIS — J9690 Respiratory failure, unspecified, unspecified whether with hypoxia or hypercapnia: Secondary | ICD-10-CM | POA: Diagnosis not present

## 2022-06-01 DIAGNOSIS — R000 Tachycardia, unspecified: Secondary | ICD-10-CM | POA: Diagnosis present

## 2022-06-01 DIAGNOSIS — I700 Atherosclerosis of aorta: Secondary | ICD-10-CM | POA: Diagnosis not present

## 2022-06-01 DIAGNOSIS — E785 Hyperlipidemia, unspecified: Secondary | ICD-10-CM | POA: Diagnosis present

## 2022-06-01 DIAGNOSIS — R0602 Shortness of breath: Secondary | ICD-10-CM | POA: Diagnosis not present

## 2022-06-01 DIAGNOSIS — M47812 Spondylosis without myelopathy or radiculopathy, cervical region: Secondary | ICD-10-CM | POA: Diagnosis not present

## 2022-06-01 DIAGNOSIS — S022XXA Fracture of nasal bones, initial encounter for closed fracture: Secondary | ICD-10-CM | POA: Diagnosis not present

## 2022-06-01 DIAGNOSIS — J449 Chronic obstructive pulmonary disease, unspecified: Secondary | ICD-10-CM | POA: Diagnosis present

## 2022-06-01 DIAGNOSIS — J151 Pneumonia due to Pseudomonas: Secondary | ICD-10-CM | POA: Diagnosis present

## 2022-06-01 DIAGNOSIS — Y907 Blood alcohol level of 200-239 mg/100 ml: Secondary | ICD-10-CM | POA: Diagnosis present

## 2022-06-01 DIAGNOSIS — S199XXA Unspecified injury of neck, initial encounter: Secondary | ICD-10-CM | POA: Diagnosis not present

## 2022-06-01 DIAGNOSIS — I959 Hypotension, unspecified: Secondary | ICD-10-CM | POA: Diagnosis not present

## 2022-06-01 DIAGNOSIS — S060XAA Concussion with loss of consciousness status unknown, initial encounter: Secondary | ICD-10-CM | POA: Diagnosis not present

## 2022-06-01 DIAGNOSIS — S27322A Contusion of lung, bilateral, initial encounter: Secondary | ICD-10-CM | POA: Diagnosis present

## 2022-06-01 DIAGNOSIS — R451 Restlessness and agitation: Secondary | ICD-10-CM | POA: Diagnosis not present

## 2022-06-01 DIAGNOSIS — S0083XA Contusion of other part of head, initial encounter: Secondary | ICD-10-CM | POA: Diagnosis not present

## 2022-06-01 DIAGNOSIS — R0902 Hypoxemia: Secondary | ICD-10-CM | POA: Diagnosis not present

## 2022-06-01 DIAGNOSIS — T07XXXA Unspecified multiple injuries, initial encounter: Principal | ICD-10-CM

## 2022-06-01 DIAGNOSIS — J811 Chronic pulmonary edema: Secondary | ICD-10-CM | POA: Diagnosis not present

## 2022-06-01 DIAGNOSIS — R509 Fever, unspecified: Secondary | ICD-10-CM | POA: Diagnosis not present

## 2022-06-01 DIAGNOSIS — M25519 Pain in unspecified shoulder: Secondary | ICD-10-CM | POA: Diagnosis not present

## 2022-06-01 DIAGNOSIS — F10129 Alcohol abuse with intoxication, unspecified: Secondary | ICD-10-CM | POA: Diagnosis present

## 2022-06-01 DIAGNOSIS — Z515 Encounter for palliative care: Secondary | ICD-10-CM | POA: Diagnosis not present

## 2022-06-01 DIAGNOSIS — I10 Essential (primary) hypertension: Secondary | ICD-10-CM | POA: Diagnosis present

## 2022-06-01 DIAGNOSIS — J9602 Acute respiratory failure with hypercapnia: Principal | ICD-10-CM | POA: Diagnosis present

## 2022-06-01 DIAGNOSIS — D72829 Elevated white blood cell count, unspecified: Secondary | ICD-10-CM | POA: Diagnosis not present

## 2022-06-01 DIAGNOSIS — K3189 Other diseases of stomach and duodenum: Secondary | ICD-10-CM | POA: Diagnosis not present

## 2022-06-01 DIAGNOSIS — I6523 Occlusion and stenosis of bilateral carotid arteries: Secondary | ICD-10-CM | POA: Diagnosis not present

## 2022-06-01 DIAGNOSIS — S27329A Contusion of lung, unspecified, initial encounter: Secondary | ICD-10-CM

## 2022-06-01 DIAGNOSIS — S0990XA Unspecified injury of head, initial encounter: Secondary | ICD-10-CM | POA: Diagnosis not present

## 2022-06-01 DIAGNOSIS — Z7984 Long term (current) use of oral hypoglycemic drugs: Secondary | ICD-10-CM

## 2022-06-01 DIAGNOSIS — S3991XA Unspecified injury of abdomen, initial encounter: Secondary | ICD-10-CM | POA: Diagnosis not present

## 2022-06-01 DIAGNOSIS — Z7951 Long term (current) use of inhaled steroids: Secondary | ICD-10-CM

## 2022-06-01 DIAGNOSIS — Z87891 Personal history of nicotine dependence: Secondary | ICD-10-CM

## 2022-06-01 DIAGNOSIS — S27321A Contusion of lung, unilateral, initial encounter: Secondary | ICD-10-CM | POA: Diagnosis not present

## 2022-06-01 DIAGNOSIS — F419 Anxiety disorder, unspecified: Secondary | ICD-10-CM | POA: Diagnosis present

## 2022-06-01 DIAGNOSIS — S59901A Unspecified injury of right elbow, initial encounter: Secondary | ICD-10-CM | POA: Diagnosis not present

## 2022-06-01 DIAGNOSIS — F10929 Alcohol use, unspecified with intoxication, unspecified: Secondary | ICD-10-CM | POA: Diagnosis not present

## 2022-06-01 DIAGNOSIS — Z981 Arthrodesis status: Secondary | ICD-10-CM

## 2022-06-01 DIAGNOSIS — J189 Pneumonia, unspecified organism: Secondary | ICD-10-CM | POA: Diagnosis not present

## 2022-06-01 LAB — I-STAT CHEM 8, ED
BUN: 27 mg/dL — ABNORMAL HIGH (ref 8–23)
Calcium, Ion: 1.15 mmol/L (ref 1.15–1.40)
Chloride: 105 mmol/L (ref 98–111)
Creatinine, Ser: 1.8 mg/dL — ABNORMAL HIGH (ref 0.61–1.24)
Glucose, Bld: 144 mg/dL — ABNORMAL HIGH (ref 70–99)
HCT: 46 % (ref 39.0–52.0)
Hemoglobin: 15.6 g/dL (ref 13.0–17.0)
Potassium: 4.3 mmol/L (ref 3.5–5.1)
Sodium: 140 mmol/L (ref 135–145)
TCO2: 22 mmol/L (ref 22–32)

## 2022-06-01 LAB — COMPREHENSIVE METABOLIC PANEL
ALT: 19 U/L (ref 0–44)
AST: 36 U/L (ref 15–41)
Albumin: 3.9 g/dL (ref 3.5–5.0)
Alkaline Phosphatase: 66 U/L (ref 38–126)
Anion gap: 14 (ref 5–15)
BUN: 20 mg/dL (ref 8–23)
CO2: 20 mmol/L — ABNORMAL LOW (ref 22–32)
Calcium: 9.1 mg/dL (ref 8.9–10.3)
Chloride: 105 mmol/L (ref 98–111)
Creatinine, Ser: 1.56 mg/dL — ABNORMAL HIGH (ref 0.61–1.24)
GFR, Estimated: 45 mL/min — ABNORMAL LOW (ref >60)
Glucose, Bld: 148 mg/dL — ABNORMAL HIGH (ref 70–99)
Potassium: 4.3 mmol/L (ref 3.5–5.1)
Sodium: 139 mmol/L (ref 135–145)
Total Bilirubin: 0.8 mg/dL (ref 0.3–1.2)
Total Protein: 6.5 g/dL (ref 6.5–8.1)

## 2022-06-01 LAB — I-STAT ARTERIAL BLOOD GAS, ED
Acid-base deficit: 8 mmol/L — ABNORMAL HIGH (ref 0.0–2.0)
Bicarbonate: 19.1 mmol/L — ABNORMAL LOW (ref 20.0–28.0)
Calcium, Ion: 1.17 mmol/L (ref 1.15–1.40)
HCT: 42 % (ref 39.0–52.0)
Hemoglobin: 14.3 g/dL (ref 13.0–17.0)
O2 Saturation: 100 %
Potassium: 3 mmol/L — ABNORMAL LOW (ref 3.5–5.1)
Sodium: 138 mmol/L (ref 135–145)
TCO2: 20 mmol/L — ABNORMAL LOW (ref 22–32)
pCO2 arterial: 45.1 mmHg (ref 32–48)
pH, Arterial: 7.236 — ABNORMAL LOW (ref 7.35–7.45)
pO2, Arterial: 467 mmHg — ABNORMAL HIGH (ref 83–108)

## 2022-06-01 LAB — URINALYSIS, ROUTINE W REFLEX MICROSCOPIC
Bacteria, UA: NONE SEEN
Bilirubin Urine: NEGATIVE
Glucose, UA: NEGATIVE mg/dL
Ketones, ur: NEGATIVE mg/dL
Leukocytes,Ua: NEGATIVE
Nitrite: NEGATIVE
Protein, ur: 30 mg/dL — AB
Specific Gravity, Urine: 1.01 (ref 1.005–1.030)
pH: 6 (ref 5.0–8.0)

## 2022-06-01 LAB — CBC
HCT: 45.8 % (ref 39.0–52.0)
Hemoglobin: 15 g/dL (ref 13.0–17.0)
MCH: 30.2 pg (ref 26.0–34.0)
MCHC: 32.8 g/dL (ref 30.0–36.0)
MCV: 92.2 fL (ref 80.0–100.0)
Platelets: 414 10*3/uL — ABNORMAL HIGH (ref 150–400)
RBC: 4.97 MIL/uL (ref 4.22–5.81)
RDW: 15.2 % (ref 11.5–15.5)
WBC: 11.9 10*3/uL — ABNORMAL HIGH (ref 4.0–10.5)
nRBC: 0 % (ref 0.0–0.2)

## 2022-06-01 LAB — PROTIME-INR
INR: 1.1 (ref 0.8–1.2)
Prothrombin Time: 13.9 seconds (ref 11.4–15.2)

## 2022-06-01 LAB — SAMPLE TO BLOOD BANK

## 2022-06-01 LAB — LACTIC ACID, PLASMA: Lactic Acid, Venous: 4 mmol/L (ref 0.5–1.9)

## 2022-06-01 LAB — ETHANOL: Alcohol, Ethyl (B): 223 mg/dL — ABNORMAL HIGH (ref <10)

## 2022-06-01 MED ORDER — FENTANYL 2500MCG IN NS 250ML (10MCG/ML) PREMIX INFUSION
0.0000 ug/h | INTRAVENOUS | Status: DC
  Administered 2022-06-02: 325 ug/h via INTRAVENOUS
  Filled 2022-06-01: qty 250

## 2022-06-01 MED ORDER — PROPOFOL 1000 MG/100ML IV EMUL
INTRAVENOUS | Status: AC
  Administered 2022-06-01: 20 ug/kg/min via INTRAVENOUS
  Filled 2022-06-01: qty 100

## 2022-06-01 MED ORDER — MIDAZOLAM-SODIUM CHLORIDE 100-0.9 MG/100ML-% IV SOLN
INTRAVENOUS | Status: AC
  Administered 2022-06-01: 1 mg/h via INTRAVENOUS
  Filled 2022-06-01: qty 100

## 2022-06-01 MED ORDER — HYDRALAZINE HCL 20 MG/ML IJ SOLN: 10.0000 mg | INTRAMUSCULAR | Status: DC | PRN

## 2022-06-01 MED ORDER — CEFAZOLIN SODIUM-DEXTROSE 1-4 GM/50ML-% IV SOLN
1.0000 g | Freq: Once | INTRAVENOUS | Status: AC
  Administered 2022-06-01: 1 g via INTRAVENOUS
  Filled 2022-06-01: qty 50

## 2022-06-01 MED ORDER — ORAL CARE MOUTH RINSE: 15.0000 mL | OROMUCOSAL | Status: DC | PRN

## 2022-06-01 MED ORDER — ROCURONIUM BROMIDE 10 MG/ML (PF) SYRINGE
PREFILLED_SYRINGE | INTRAVENOUS | Status: AC
  Filled 2022-06-01: qty 10

## 2022-06-01 MED ORDER — ONDANSETRON 4 MG PO TBDP: 4.0000 mg | ORAL_TABLET | Freq: Four times a day (QID) | ORAL | Status: DC | PRN

## 2022-06-01 MED ORDER — PROPOFOL 1000 MG/100ML IV EMUL: 5.0000 ug/kg/min | INTRAVENOUS | Status: DC

## 2022-06-01 MED ORDER — ETOMIDATE 2 MG/ML IV SOLN
INTRAVENOUS | Status: AC | PRN
  Administered 2022-06-01: 20 mg via INTRAVENOUS

## 2022-06-01 MED ORDER — ENOXAPARIN SODIUM 30 MG/0.3ML IJ SOSY
30.0000 mg | PREFILLED_SYRINGE | Freq: Two times a day (BID) | INTRAMUSCULAR | Status: DC
  Administered 2022-06-02 – 2022-06-18 (×33): 30 mg via SUBCUTANEOUS
  Filled 2022-06-01 (×33): qty 0.3

## 2022-06-01 MED ORDER — ROCURONIUM BROMIDE 50 MG/5ML IV SOLN
INTRAVENOUS | Status: AC | PRN
  Administered 2022-06-01: 75 mg via INTRAVENOUS

## 2022-06-01 MED ORDER — PROPOFOL 1000 MG/100ML IV EMUL
5.0000 ug/kg/min | INTRAVENOUS | Status: AC
  Administered 2022-06-01: 20 ug/kg/min via INTRAVENOUS
  Filled 2022-06-01: qty 100

## 2022-06-01 MED ORDER — FENTANYL 2500MCG IN NS 250ML (10MCG/ML) PREMIX INFUSION
INTRAVENOUS | Status: AC
  Administered 2022-06-01: 50 ug/h via INTRAVENOUS
  Filled 2022-06-01: qty 250

## 2022-06-01 MED ORDER — TETANUS-DIPHTH-ACELL PERTUSSIS 5-2.5-18.5 LF-MCG/0.5 IM SUSY
0.5000 mL | PREFILLED_SYRINGE | Freq: Once | INTRAMUSCULAR | Status: AC
  Administered 2022-06-01: 0.5 mL via INTRAMUSCULAR
  Filled 2022-06-01: qty 0.5

## 2022-06-01 MED ORDER — MIDAZOLAM-SODIUM CHLORIDE 100-0.9 MG/100ML-% IV SOLN: 0.5000 mg/h | INTRAVENOUS | Status: DC

## 2022-06-01 MED ORDER — ETOMIDATE 2 MG/ML IV SOLN
INTRAVENOUS | Status: AC
  Filled 2022-06-01: qty 10

## 2022-06-01 MED ORDER — LACTATED RINGERS IV SOLN: INTRAVENOUS | Status: DC

## 2022-06-01 MED ORDER — CHLORHEXIDINE GLUCONATE CLOTH 2 % EX PADS
6.0000 | MEDICATED_PAD | Freq: Every day | CUTANEOUS | Status: DC
  Administered 2022-06-01 – 2022-06-18 (×20): 6 via TOPICAL

## 2022-06-01 MED ORDER — ONDANSETRON HCL 4 MG/2ML IJ SOLN: 4.0000 mg | Freq: Four times a day (QID) | INTRAMUSCULAR | Status: DC | PRN

## 2022-06-01 MED ORDER — ORAL CARE MOUTH RINSE
15.0000 mL | OROMUCOSAL | Status: DC
  Administered 2022-06-01 – 2022-06-18 (×204): 15 mL via OROMUCOSAL

## 2022-06-01 MED ORDER — IOHEXOL 350 MG/ML SOLN
75.0000 mL | Freq: Once | INTRAVENOUS | Status: AC | PRN
  Administered 2022-06-01: 75 mL via INTRAVENOUS

## 2022-06-01 NOTE — Progress Notes (Signed)
Date and time results received: 05/28/2022 2300  Test: Lactic Acid Critical Value: 4.0  Name of Provider Notified: [place]  Orders Received? No new orders at this time.
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
## 2022-06-01 NOTE — Progress Notes (Signed)
Pt transported to CT and back to ER 27 on full vent support. No complications noted.

## 2022-06-01 NOTE — ED Notes (Signed)
Report called to ICU nurse at this time. This RN will take patient up.
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
## 2022-06-01 NOTE — Progress Notes (Signed)
   06/02/2022 1900  Clinical Encounter Type  Visited With Patient  Visit Type Initial;ED  Referral From Nurse  Consult/Referral To Chaplain  Spiritual Encounters  Spiritual Needs Prayer  Stress Factors  Patient Stress Factors None identified  Family Stress Factors None identified   Met with patient at bedside and staff alongside patient.  No existential distress exhibited at this time.
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
## 2022-06-02 ENCOUNTER — Inpatient Hospital Stay (HOSPITAL_COMMUNITY): Payer: Medicare PPO

## 2022-06-02 LAB — BASIC METABOLIC PANEL
Anion gap: 10 (ref 5–15)
BUN: 18 mg/dL (ref 8–23)
CO2: 18 mmol/L — ABNORMAL LOW (ref 22–32)
Calcium: 8.2 mg/dL — ABNORMAL LOW (ref 8.9–10.3)
Chloride: 111 mmol/L (ref 98–111)
Creatinine, Ser: 1.45 mg/dL — ABNORMAL HIGH (ref 0.61–1.24)
GFR, Estimated: 50 mL/min — ABNORMAL LOW (ref >60)
Glucose, Bld: 102 mg/dL — ABNORMAL HIGH (ref 70–99)
Potassium: 3.8 mmol/L (ref 3.5–5.1)
Sodium: 139 mmol/L (ref 135–145)

## 2022-06-02 LAB — LACTIC ACID, PLASMA: Lactic Acid, Venous: 2.1 mmol/L (ref 0.5–1.9)

## 2022-06-02 LAB — MAGNESIUM
Magnesium: 1.8 mg/dL (ref 1.7–2.4)
Magnesium: 1.8 mg/dL (ref 1.7–2.4)

## 2022-06-02 LAB — PHOSPHORUS
Phosphorus: 2.2 mg/dL — ABNORMAL LOW (ref 2.5–4.6)
Phosphorus: 2.2 mg/dL — ABNORMAL LOW (ref 2.5–4.6)

## 2022-06-02 LAB — CBC
HCT: 39.7 % (ref 39.0–52.0)
Hemoglobin: 12.8 g/dL — ABNORMAL LOW (ref 13.0–17.0)
MCH: 30 pg (ref 26.0–34.0)
MCHC: 32.2 g/dL (ref 30.0–36.0)
MCV: 93.2 fL (ref 80.0–100.0)
Platelets: 305 10*3/uL (ref 150–400)
RBC: 4.26 MIL/uL (ref 4.22–5.81)
RDW: 15.9 % — ABNORMAL HIGH (ref 11.5–15.5)
WBC: 15 10*3/uL — ABNORMAL HIGH (ref 4.0–10.5)
nRBC: 0.1 % (ref 0.0–0.2)

## 2022-06-02 LAB — GLUCOSE, CAPILLARY
Glucose-Capillary: 107 mg/dL — ABNORMAL HIGH (ref 70–99)
Glucose-Capillary: 117 mg/dL — ABNORMAL HIGH (ref 70–99)
Glucose-Capillary: 127 mg/dL — ABNORMAL HIGH (ref 70–99)
Glucose-Capillary: 140 mg/dL — ABNORMAL HIGH (ref 70–99)

## 2022-06-02 LAB — TRIGLYCERIDES: Triglycerides: 247 mg/dL — ABNORMAL HIGH (ref <150)

## 2022-06-02 MED ORDER — FOLIC ACID 1 MG PO TABS
1.0000 mg | ORAL_TABLET | Freq: Every day | ORAL | Status: DC
  Administered 2022-06-02 – 2022-06-18 (×17): 1 mg
  Filled 2022-06-02 (×17): qty 1

## 2022-06-02 MED ORDER — FENTANYL 2500MCG IN NS 250ML (10MCG/ML) PREMIX INFUSION
0.0000 ug/h | INTRAVENOUS | Status: DC
  Administered 2022-06-02: 325 ug/h via INTRAVENOUS
  Administered 2022-06-02: 200 ug/h via INTRAVENOUS
  Administered 2022-06-03: 50 ug/h via INTRAVENOUS
  Administered 2022-06-05: 30 ug/h via INTRAVENOUS
  Filled 2022-06-02 (×4): qty 250

## 2022-06-02 MED ORDER — LACTATED RINGERS IV BOLUS
1000.0000 mL | Freq: Once | INTRAVENOUS | Status: AC
  Administered 2022-06-02: 1000 mL via INTRAVENOUS

## 2022-06-02 MED ORDER — NOREPINEPHRINE 4 MG/250ML-% IV SOLN
2.0000 ug/min | INTRAVENOUS | Status: DC
  Administered 2022-06-02: 2 ug/min via INTRAVENOUS
  Administered 2022-06-03: 4 ug/min via INTRAVENOUS
  Administered 2022-06-04: 6 ug/min via INTRAVENOUS
  Administered 2022-06-04 – 2022-06-05 (×3): 10 ug/min via INTRAVENOUS
  Administered 2022-06-05: 14 ug/min via INTRAVENOUS
  Administered 2022-06-06: 10 ug/min via INTRAVENOUS
  Administered 2022-06-06: 11 ug/min via INTRAVENOUS
  Administered 2022-06-06: 10 ug/min via INTRAVENOUS
  Administered 2022-06-06: 14 ug/min via INTRAVENOUS
  Administered 2022-06-07: 4 ug/min via INTRAVENOUS
  Administered 2022-06-08: 2 ug/min via INTRAVENOUS
  Filled 2022-06-02 (×13): qty 250

## 2022-06-02 MED ORDER — FENTANYL BOLUS VIA INFUSION
50.0000 ug | INTRAVENOUS | Status: DC | PRN
  Administered 2022-06-03 – 2022-06-05 (×11): 50 ug via INTRAVENOUS

## 2022-06-02 MED ORDER — MIDAZOLAM HCL 2 MG/2ML IJ SOLN
INTRAMUSCULAR | Status: AC
  Administered 2022-06-02: 1 mg via INTRAVENOUS
  Filled 2022-06-02: qty 2

## 2022-06-02 MED ORDER — MIDAZOLAM HCL 2 MG/2ML IJ SOLN: 1.0000 mg | INTRAMUSCULAR | Status: DC | PRN

## 2022-06-02 MED ORDER — METHOCARBAMOL 500 MG PO TABS
1000.0000 mg | ORAL_TABLET | Freq: Three times a day (TID) | ORAL | Status: DC
  Administered 2022-06-02 – 2022-06-18 (×50): 1000 mg
  Filled 2022-06-02 (×49): qty 2

## 2022-06-02 MED ORDER — ACETAMINOPHEN 160 MG/5ML PO SOLN: 100.0000 mg | Freq: Four times a day (QID) | ORAL | Status: DC

## 2022-06-02 MED ORDER — PANTOPRAZOLE SODIUM 40 MG IV SOLR
40.0000 mg | Freq: Every day | INTRAVENOUS | Status: DC
  Administered 2022-06-02 – 2022-06-17 (×16): 40 mg via INTRAVENOUS
  Filled 2022-06-02 (×16): qty 10

## 2022-06-02 MED ORDER — ACETAMINOPHEN 160 MG/5ML PO SOLN
650.0000 mg | Freq: Four times a day (QID) | ORAL | Status: DC | PRN
  Administered 2022-06-02: 650 mg
  Filled 2022-06-02: qty 20.3

## 2022-06-02 MED ORDER — THIAMINE MONONITRATE 100 MG PO TABS
100.0000 mg | ORAL_TABLET | Freq: Every day | ORAL | Status: DC
  Administered 2022-06-02 – 2022-06-18 (×17): 100 mg
  Filled 2022-06-02 (×17): qty 1

## 2022-06-02 MED ORDER — CHLORDIAZEPOXIDE HCL 10 MG PO CAPS: 10.0000 mg | ORAL_CAPSULE | Freq: Three times a day (TID) | ORAL | Status: DC

## 2022-06-02 MED ORDER — CHLORDIAZEPOXIDE HCL 5 MG PO CAPS
10.0000 mg | ORAL_CAPSULE | Freq: Three times a day (TID) | ORAL | Status: DC
  Administered 2022-06-02 (×3): 10 mg
  Filled 2022-06-02 (×4): qty 2

## 2022-06-02 MED ORDER — ACETAMINOPHEN 160 MG/5ML PO SOLN
1000.0000 mg | Freq: Four times a day (QID) | ORAL | Status: DC
  Administered 2022-06-02 – 2022-06-18 (×63): 1000 mg
  Filled 2022-06-02 (×64): qty 40.6

## 2022-06-02 MED ORDER — NOREPINEPHRINE 4 MG/250ML-% IV SOLN
0.0000 ug/min | INTRAVENOUS | Status: DC
  Administered 2022-06-02: 2 ug/min via INTRAVENOUS
  Filled 2022-06-02: qty 250

## 2022-06-02 MED ORDER — OXYCODONE HCL 5 MG PO TABS
5.0000 mg | ORAL_TABLET | ORAL | Status: DC | PRN
  Administered 2022-06-02 – 2022-06-03 (×4): 10 mg
  Administered 2022-06-04: 5 mg
  Administered 2022-06-04 – 2022-06-05 (×3): 10 mg
  Administered 2022-06-05: 5 mg
  Administered 2022-06-06: 10 mg
  Filled 2022-06-02: qty 1
  Filled 2022-06-02 (×9): qty 2

## 2022-06-02 MED ORDER — ADULT MULTIVITAMIN W/MINERALS CH
1.0000 | ORAL_TABLET | Freq: Every day | ORAL | Status: DC
  Administered 2022-06-02 – 2022-06-18 (×17): 1
  Filled 2022-06-02 (×17): qty 1

## 2022-06-02 MED ORDER — VITAL HIGH PROTEIN PO LIQD
1000.0000 mL | ORAL | Status: DC
  Administered 2022-06-02 – 2022-06-03 (×2): 1000 mL

## 2022-06-02 MED ORDER — DEXMEDETOMIDINE HCL IN NACL 400 MCG/100ML IV SOLN
0.4000 ug/kg/h | INTRAVENOUS | Status: DC
  Administered 2022-06-02: 1 ug/kg/h via INTRAVENOUS
  Administered 2022-06-02: 0.4 ug/kg/h via INTRAVENOUS
  Administered 2022-06-02: 1 ug/kg/h via INTRAVENOUS
  Administered 2022-06-02: 1.1 ug/kg/h via INTRAVENOUS
  Administered 2022-06-03: 0.7 ug/kg/h via INTRAVENOUS
  Administered 2022-06-03: 0.8 ug/kg/h via INTRAVENOUS
  Administered 2022-06-03: 0.9 ug/kg/h via INTRAVENOUS
  Administered 2022-06-04: 0.8 ug/kg/h via INTRAVENOUS
  Administered 2022-06-04 (×2): 0.9 ug/kg/h via INTRAVENOUS
  Administered 2022-06-04 – 2022-06-05 (×2): 0.8 ug/kg/h via INTRAVENOUS
  Administered 2022-06-06: 1 ug/kg/h via INTRAVENOUS
  Administered 2022-06-06: 0.5 ug/kg/h via INTRAVENOUS
  Administered 2022-06-06: 1 ug/kg/h via INTRAVENOUS
  Administered 2022-06-06: 0.9 ug/kg/h via INTRAVENOUS
  Administered 2022-06-07 – 2022-06-10 (×11): 1.2 ug/kg/h via INTRAVENOUS
  Administered 2022-06-10: 0.4 ug/kg/h via INTRAVENOUS
  Administered 2022-06-10 – 2022-06-11 (×2): 1.2 ug/kg/h via INTRAVENOUS
  Administered 2022-06-11: 1 ug/kg/h via INTRAVENOUS
  Filled 2022-06-02 (×5): qty 100
  Filled 2022-06-02: qty 200
  Filled 2022-06-02 (×25): qty 100

## 2022-06-02 MED ORDER — SODIUM CHLORIDE 0.9 % IV SOLN
250.0000 mL | INTRAVENOUS | Status: DC
  Administered 2022-06-11 – 2022-06-17 (×3): 250 mL via INTRAVENOUS

## 2022-06-02 MED ORDER — SODIUM CHLORIDE 0.9% FLUSH: 10.0000 mL | INTRAVENOUS | Status: DC | PRN

## 2022-06-02 MED ORDER — LORAZEPAM 2 MG/ML IJ SOLN
1.0000 mg | INTRAMUSCULAR | Status: AC | PRN
  Administered 2022-06-02: 1 mg via INTRAVENOUS
  Administered 2022-06-03: 2 mg via INTRAVENOUS
  Administered 2022-06-03: 1 mg via INTRAVENOUS
  Administered 2022-06-04 (×2): 2 mg via INTRAVENOUS
  Filled 2022-06-02 (×3): qty 1
  Filled 2022-06-02: qty 2
  Filled 2022-06-02 (×3): qty 1

## 2022-06-02 MED ORDER — PROSOURCE TF20 ENFIT COMPATIBL EN LIQD
60.0000 mL | Freq: Every day | ENTERAL | Status: DC
  Administered 2022-06-02 – 2022-06-04 (×3): 60 mL
  Filled 2022-06-02 (×3): qty 60

## 2022-06-02 MED ORDER — LORAZEPAM 1 MG PO TABS
1.0000 mg | ORAL_TABLET | ORAL | Status: AC | PRN
  Administered 2022-06-05: 1 mg
  Filled 2022-06-02: qty 1

## 2022-06-02 MED ORDER — SODIUM CHLORIDE 0.9% FLUSH
10.0000 mL | Freq: Two times a day (BID) | INTRAVENOUS | Status: DC
  Administered 2022-06-02: 10 mL
  Administered 2022-06-02 – 2022-06-03 (×2): 30 mL
  Administered 2022-06-03 – 2022-06-05 (×3): 10 mL
  Administered 2022-06-05: 30 mL
  Administered 2022-06-06 (×2): 10 mL
  Administered 2022-06-07: 30 mL
  Administered 2022-06-08 – 2022-06-10 (×4): 10 mL
  Administered 2022-06-10: 40 mL
  Administered 2022-06-11 (×2): 10 mL
  Administered 2022-06-12: 30 mL
  Administered 2022-06-12 – 2022-06-16 (×8): 10 mL
  Administered 2022-06-17 – 2022-06-18 (×2): 30 mL

## 2022-06-02 MED ORDER — FENTANYL CITRATE PF 50 MCG/ML IJ SOSY
50.0000 ug | PREFILLED_SYRINGE | Freq: Once | INTRAMUSCULAR | Status: DC
  Filled 2022-06-02: qty 1
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
## 2022-06-02 NOTE — Progress Notes (Signed)
Peripherally Inserted Central Catheter Placement  The IV Nurse has discussed with the patient and/or persons authorized to consent for the patient, the purpose of this procedure and the potential benefits and risks involved with this procedure.  The benefits include less needle sticks, lab draws from the catheter, and the patient may be discharged home with the catheter. Risks include, but not limited to, infection, bleeding, blood clot (thrombus formation), and puncture of an artery; nerve damage and irregular heartbeat and possibility to perform a PICC exchange if needed/ordered by physician.  Alternatives to this procedure were also discussed.  Bard Power PICC patient education guide, fact sheet on infection prevention and patient information card has been provided to patient /or left at bedside.    PICC Placement Documentation  PICC Triple Lumen 06/02/22 Right Brachial 39 cm 0 cm (Active)  Site Assessment Clean, Dry, Intact 06/02/22 1159  Lumen #1 Status Flushed;Saline locked;Blood return noted 06/02/22 1159  Lumen #2 Status Flushed;Saline locked;Blood return noted 06/02/22 1159  Lumen #3 Status Flushed;Saline locked;Blood return noted 06/02/22 1159  Dressing Type Transparent;Securing device 06/02/22 1159  Dressing Status Antimicrobial disc in place 06/02/22 1159  Safety Lock Not Applicable 71/24/58 0998  Dressing Change Due 06/09/22 06/02/22 [place] 06/02/2022, 12:01 PM

## 2022-06-03 ENCOUNTER — Inpatient Hospital Stay: Payer: Self-pay

## 2022-06-03 LAB — CBC
HCT: 34.4 % — ABNORMAL LOW (ref 39.0–52.0)
Hemoglobin: 11.7 g/dL — ABNORMAL LOW (ref 13.0–17.0)
MCH: 31.4 pg (ref 26.0–34.0)
MCHC: 34 g/dL (ref 30.0–36.0)
MCV: 92.2 fL (ref 80.0–100.0)
Platelets: 259 10*3/uL (ref 150–400)
RBC: 3.73 MIL/uL — ABNORMAL LOW (ref 4.22–5.81)
RDW: 16 % — ABNORMAL HIGH (ref 11.5–15.5)
WBC: 13.9 10*3/uL — ABNORMAL HIGH (ref 4.0–10.5)
nRBC: 0 % (ref 0.0–0.2)

## 2022-06-03 LAB — GLUCOSE, CAPILLARY
Glucose-Capillary: 114 mg/dL — ABNORMAL HIGH (ref 70–99)
Glucose-Capillary: 116 mg/dL — ABNORMAL HIGH (ref 70–99)
Glucose-Capillary: 130 mg/dL — ABNORMAL HIGH (ref 70–99)
Glucose-Capillary: 130 mg/dL — ABNORMAL HIGH (ref 70–99)
Glucose-Capillary: 160 mg/dL — ABNORMAL HIGH (ref 70–99)
Glucose-Capillary: 165 mg/dL — ABNORMAL HIGH (ref 70–99)

## 2022-06-03 LAB — BASIC METABOLIC PANEL
Anion gap: 6 (ref 5–15)
BUN: 21 mg/dL (ref 8–23)
CO2: 23 mmol/L (ref 22–32)
Calcium: 8 mg/dL — ABNORMAL LOW (ref 8.9–10.3)
Chloride: 108 mmol/L (ref 98–111)
Creatinine, Ser: 1.4 mg/dL — ABNORMAL HIGH (ref 0.61–1.24)
GFR, Estimated: 52 mL/min — ABNORMAL LOW (ref >60)
Glucose, Bld: 135 mg/dL — ABNORMAL HIGH (ref 70–99)
Potassium: 3.8 mmol/L (ref 3.5–5.1)
Sodium: 137 mmol/L (ref 135–145)

## 2022-06-03 LAB — MAGNESIUM
Magnesium: 1.9 mg/dL (ref 1.7–2.4)
Magnesium: 1.8 mg/dL (ref 1.7–2.4)

## 2022-06-03 LAB — PHOSPHORUS
Phosphorus: 2.5 mg/dL (ref 2.5–4.6)
Phosphorus: 1.8 mg/dL — ABNORMAL LOW (ref 2.5–4.6)

## 2022-06-03 MED ORDER — CHLORDIAZEPOXIDE HCL 5 MG PO CAPS
15.0000 mg | ORAL_CAPSULE | Freq: Three times a day (TID) | ORAL | Status: DC
  Administered 2022-06-03 – 2022-06-08 (×15): 15 mg
  Filled 2022-06-03 (×16): qty 3

## 2022-06-03 MED ORDER — QUETIAPINE FUMARATE 25 MG PO TABS
50.0000 mg | ORAL_TABLET | Freq: Two times a day (BID) | ORAL | Status: DC
  Administered 2022-06-03 – 2022-06-05 (×6): 50 mg
  Filled 2022-06-03 (×7): qty 2

## 2022-06-03 MED ORDER — LACTATED RINGERS IV SOLN: INTRAVENOUS | Status: DC

## 2022-06-03 NOTE — ED Notes (Signed)
..  Trauma Event Note    Reason for Call :  Recived call from Primary RN re CBG of 165. No SS ordered and pt is on tube feedings.  MD notified, call MD if BS exceeds 180.   Last imported Vital Signs BP (!) 115/54   Pulse 69   Temp 97.9 F (36.6 C) (Axillary)   Resp 20   Ht '5\' 6"'$  (1.676 m)   Wt 167 lb 8.8 oz (76 kg)   SpO2 96%   BMI 27.04 kg/m   Trending CBC Recent Labs    05/20/2022 1723 05/21/2022 1737 05/31/2022 1837 06/02/22 0359 06/03/22 0435  WBC 11.9*  --   --  15.0* 13.9*  HGB 15.0   < > 14.3 12.8* 11.7*  HCT 45.8   < > 42.0 39.7 34.4*  PLT 414*  --   --  305 259   < > = values in this interval not displayed.    Trending Coag's Recent Labs    05/27/2022 1723  INR 1.1    Trending BMET Recent Labs    05/29/2022 1723 05/26/2022 1737 05/18/2022 1837 06/02/22 0359 06/03/22 0435  NA 139 140 138 139 137  K 4.3 4.3 3.0* 3.8 3.8  CL 105 105  --  111 108  CO2 20*  --   --  18* 23  BUN 20 27*  --  18 21  CREATININE 1.56* 1.80*  --  1.45* 1.40*  GLUCOSE 148* 144*  --  102* 135*      [name]  Trauma Response RN  Please call TRN at [phone] for further assistance.
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
## 2022-06-03 NOTE — TOC CAGE-AID Note (Signed)
Transition of Care (TOC) - CAGE-AID Screening   Patient Details  Name: Connor Medlen Sr. MRN: [id number] Date of Birth: 18-Aug-1945  Transition of Care [facility] CM/SW Contact:    Benard Halsted, LCSW Phone Number: 06/03/2022, 11:24 AM   Clinical Narrative: Patient unable to participate in screening at this time (intubated).    CAGE-AID Screening: Substance Abuse Screening unable to be completed due to: : Patient unable to participate

## 2022-06-04 ENCOUNTER — Inpatient Hospital Stay (HOSPITAL_COMMUNITY): Payer: Medicare PPO

## 2022-06-04 LAB — BASIC METABOLIC PANEL
Anion gap: 5 (ref 5–15)
BUN: 18 mg/dL (ref 8–23)
CO2: 21 mmol/L — ABNORMAL LOW (ref 22–32)
Calcium: 7.8 mg/dL — ABNORMAL LOW (ref 8.9–10.3)
Chloride: 108 mmol/L (ref 98–111)
Creatinine, Ser: 1.08 mg/dL (ref 0.61–1.24)
GFR, Estimated: >60 mL/min (ref >60)
Glucose, Bld: 210 mg/dL — ABNORMAL HIGH (ref 70–99)
Potassium: 3.4 mmol/L — ABNORMAL LOW (ref 3.5–5.1)
Sodium: 134 mmol/L — ABNORMAL LOW (ref 135–145)

## 2022-06-04 LAB — CBC
HCT: 27.6 % — ABNORMAL LOW (ref 39.0–52.0)
Hemoglobin: 9.1 g/dL — ABNORMAL LOW (ref 13.0–17.0)
MCH: 30.5 pg (ref 26.0–34.0)
MCHC: 33 g/dL (ref 30.0–36.0)
MCV: 92.6 fL (ref 80.0–100.0)
Platelets: 221 10*3/uL (ref 150–400)
RBC: 2.98 MIL/uL — ABNORMAL LOW (ref 4.22–5.81)
RDW: 16.4 % — ABNORMAL HIGH (ref 11.5–15.5)
WBC: 7.4 10*3/uL (ref 4.0–10.5)
nRBC: 0.3 % — ABNORMAL HIGH (ref 0.0–0.2)

## 2022-06-04 LAB — GLUCOSE, CAPILLARY
Glucose-Capillary: 114 mg/dL — ABNORMAL HIGH (ref 70–99)
Glucose-Capillary: 117 mg/dL — ABNORMAL HIGH (ref 70–99)
Glucose-Capillary: 117 mg/dL — ABNORMAL HIGH (ref 70–99)
Glucose-Capillary: 134 mg/dL — ABNORMAL HIGH (ref 70–99)
Glucose-Capillary: 105 mg/dL — ABNORMAL HIGH (ref 70–99)
Glucose-Capillary: 110 mg/dL — ABNORMAL HIGH (ref 70–99)

## 2022-06-04 MED ORDER — SODIUM PHOSPHATES 45 MMOLE/15ML IV SOLN: 15.0000 mmol | Freq: Once | INTRAVENOUS | Status: DC

## 2022-06-04 MED ORDER — FUROSEMIDE 10 MG/ML IJ SOLN
20.0000 mg | Freq: Once | INTRAMUSCULAR | Status: AC
  Administered 2022-06-04: 20 mg via INTRAVENOUS
  Filled 2022-06-04: qty 2

## 2022-06-04 MED ORDER — POTASSIUM PHOSPHATES 15 MMOLE/5ML IV SOLN
30.0000 mmol | Freq: Once | INTRAVENOUS | Status: AC
  Administered 2022-06-04: 30 mmol via INTRAVENOUS
  Filled 2022-06-04: qty 10

## 2022-06-04 MED ORDER — PIVOT 1.5 CAL PO LIQD
1000.0000 mL | ORAL | Status: DC
  Administered 2022-06-04 – 2022-06-17 (×10): 1000 mL
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
## 2022-06-05 LAB — CBC
HCT: 30.8 % — ABNORMAL LOW (ref 39.0–52.0)
Hemoglobin: 9.9 g/dL — ABNORMAL LOW (ref 13.0–17.0)
MCH: 30 pg (ref 26.0–34.0)
MCHC: 32.1 g/dL (ref 30.0–36.0)
MCV: 93.3 fL (ref 80.0–100.0)
Platelets: 241 10*3/uL (ref 150–400)
RBC: 3.3 MIL/uL — ABNORMAL LOW (ref 4.22–5.81)
RDW: 16.8 % — ABNORMAL HIGH (ref 11.5–15.5)
WBC: 9.6 10*3/uL (ref 4.0–10.5)
nRBC: 2.5 % — ABNORMAL HIGH (ref 0.0–0.2)

## 2022-06-05 LAB — COMPREHENSIVE METABOLIC PANEL
ALT: 18 U/L (ref 0–44)
AST: 38 U/L (ref 15–41)
Albumin: 1.6 g/dL — ABNORMAL LOW (ref 3.5–5.0)
Alkaline Phosphatase: 78 U/L (ref 38–126)
Anion gap: 8 (ref 5–15)
BUN: 23 mg/dL (ref 8–23)
CO2: 24 mmol/L (ref 22–32)
Calcium: 8.4 mg/dL — ABNORMAL LOW (ref 8.9–10.3)
Chloride: 106 mmol/L (ref 98–111)
Creatinine, Ser: 1.24 mg/dL (ref 0.61–1.24)
GFR, Estimated: 60 mL/min — ABNORMAL LOW (ref >60)
Glucose, Bld: 129 mg/dL — ABNORMAL HIGH (ref 70–99)
Potassium: 3.8 mmol/L (ref 3.5–5.1)
Sodium: 138 mmol/L (ref 135–145)
Total Bilirubin: 0.8 mg/dL (ref 0.3–1.2)
Total Protein: 4.8 g/dL — ABNORMAL LOW (ref 6.5–8.1)

## 2022-06-05 LAB — GLUCOSE, CAPILLARY
Glucose-Capillary: 140 mg/dL — ABNORMAL HIGH (ref 70–99)
Glucose-Capillary: 136 mg/dL — ABNORMAL HIGH (ref 70–99)
Glucose-Capillary: 136 mg/dL — ABNORMAL HIGH (ref 70–99)
Glucose-Capillary: 125 mg/dL — ABNORMAL HIGH (ref 70–99)
Glucose-Capillary: 148 mg/dL — ABNORMAL HIGH (ref 70–99)
Glucose-Capillary: 123 mg/dL — ABNORMAL HIGH (ref 70–99)

## 2022-06-05 LAB — BASIC METABOLIC PANEL
Anion gap: 5 (ref 5–15)
BUN: 23 mg/dL (ref 8–23)
CO2: 24 mmol/L (ref 22–32)
Calcium: 8 mg/dL — ABNORMAL LOW (ref 8.9–10.3)
Chloride: 107 mmol/L (ref 98–111)
Creatinine, Ser: 1.46 mg/dL — ABNORMAL HIGH (ref 0.61–1.24)
GFR, Estimated: 49 mL/min — ABNORMAL LOW (ref >60)
Glucose, Bld: 129 mg/dL — ABNORMAL HIGH (ref 70–99)
Potassium: 3.8 mmol/L (ref 3.5–5.1)
Sodium: 136 mmol/L (ref 135–145)

## 2022-06-05 LAB — MAGNESIUM: Magnesium: 2 mg/dL (ref 1.7–2.4)

## 2022-06-05 MED ORDER — LORAZEPAM 2 MG/ML IJ SOLN
1.0000 mg | INTRAMUSCULAR | Status: AC | PRN
  Administered 2022-06-05 – 2022-06-08 (×11): 2 mg via INTRAVENOUS
  Filled 2022-06-05 (×11): qty 1

## 2022-06-05 MED ORDER — LORAZEPAM 1 MG PO TABS: 1.0000 mg | ORAL_TABLET | ORAL | Status: AC | PRN

## 2022-06-05 MED ORDER — SENNOSIDES 8.8 MG/5ML PO SYRP
5.0000 mL | ORAL_SOLUTION | Freq: Two times a day (BID) | ORAL | Status: DC
  Administered 2022-06-05 – 2022-06-17 (×12): 5 mL
  Filled 2022-06-05 (×14): qty 5

## 2022-06-05 MED ORDER — FUROSEMIDE 10 MG/ML IJ SOLN
20.0000 mg | Freq: Once | INTRAMUSCULAR | Status: AC
  Administered 2022-06-05: 20 mg via INTRAVENOUS
  Filled 2022-06-05: qty 2

## 2022-06-05 MED ORDER — BISACODYL 10 MG RE SUPP
10.0000 mg | Freq: Every day | RECTAL | Status: DC | PRN
  Administered 2022-06-05: 10 mg via RECTAL
  Filled 2022-06-05: qty 1

## 2022-06-05 MED ORDER — AMIODARONE IV BOLUS ONLY 150 MG/100ML
150.0000 mg | Freq: Once | INTRAVENOUS | Status: AC
  Administered 2022-06-05: 150 mg via INTRAVENOUS
  Filled 2022-06-05: qty 100

## 2022-06-05 MED ORDER — POLYETHYLENE GLYCOL 3350 17 G PO PACK
17.0000 g | PACK | Freq: Every day | ORAL | Status: DC
  Administered 2022-06-05 – 2022-06-14 (×7): 17 g
  Filled 2022-06-05 (×9): qty 1
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
## 2022-06-05 NOTE — Progress Notes (Signed)
Called back and alerted MD Byerly that patient's HR was still 120-140.  Was given orders to give '150mg'$  amiodraone bolus.  Was also given orders to give 2g mag sulfate if Magnesium lab came back less than 1.5.

## 2022-06-05 NOTE — Progress Notes (Signed)
Patient's HR converted to Afib, at a rat of 120-140.  RN performed EKG then called Dr. Barry Dienes.  Orders were given to draw labs and give IV lasix.  Rn will perform these tasks and continue to assess and act accordingly.

## 2022-06-06 ENCOUNTER — Inpatient Hospital Stay (HOSPITAL_COMMUNITY): Payer: Medicare PPO

## 2022-06-06 LAB — CBC
HCT: 29.2 % — ABNORMAL LOW (ref 39.0–52.0)
Hemoglobin: 10.1 g/dL — ABNORMAL LOW (ref 13.0–17.0)
MCH: 31.5 pg (ref 26.0–34.0)
MCHC: 34.6 g/dL (ref 30.0–36.0)
MCV: 91 fL (ref 80.0–100.0)
Platelets: 233 10*3/uL (ref 150–400)
RBC: 3.21 MIL/uL — ABNORMAL LOW (ref 4.22–5.81)
RDW: 17.4 % — ABNORMAL HIGH (ref 11.5–15.5)
WBC: 13.2 10*3/uL — ABNORMAL HIGH (ref 4.0–10.5)
nRBC: 6 % — ABNORMAL HIGH (ref 0.0–0.2)

## 2022-06-06 LAB — RESP PANEL BY RT-PCR (RSV, FLU A&B, COVID)  RVPGX2
Influenza A by PCR: NEGATIVE
Influenza B by PCR: NEGATIVE
Resp Syncytial Virus by PCR: NEGATIVE
SARS Coronavirus 2 by RT PCR: NEGATIVE

## 2022-06-06 LAB — BASIC METABOLIC PANEL
Anion gap: 11 (ref 5–15)
Anion gap: 7 (ref 5–15)
BUN: 25 mg/dL — ABNORMAL HIGH (ref 8–23)
BUN: 27 mg/dL — ABNORMAL HIGH (ref 8–23)
CO2: 25 mmol/L (ref 22–32)
CO2: 26 mmol/L (ref 22–32)
Calcium: 8.3 mg/dL — ABNORMAL LOW (ref 8.9–10.3)
Calcium: 7.8 mg/dL — ABNORMAL LOW (ref 8.9–10.3)
Chloride: 102 mmol/L (ref 98–111)
Chloride: 103 mmol/L (ref 98–111)
Creatinine, Ser: 1.36 mg/dL — ABNORMAL HIGH (ref 0.61–1.24)
Creatinine, Ser: 1.41 mg/dL — ABNORMAL HIGH (ref 0.61–1.24)
GFR, Estimated: 54 mL/min — ABNORMAL LOW (ref >60)
GFR, Estimated: 51 mL/min — ABNORMAL LOW (ref >60)
Glucose, Bld: 183 mg/dL — ABNORMAL HIGH (ref 70–99)
Glucose, Bld: 285 mg/dL — ABNORMAL HIGH (ref 70–99)
Potassium: 3.6 mmol/L (ref 3.5–5.1)
Potassium: 3.3 mmol/L — ABNORMAL LOW (ref 3.5–5.1)
Sodium: 138 mmol/L (ref 135–145)
Sodium: 136 mmol/L (ref 135–145)

## 2022-06-06 LAB — GLUCOSE, CAPILLARY
Glucose-Capillary: 134 mg/dL — ABNORMAL HIGH (ref 70–99)
Glucose-Capillary: 138 mg/dL — ABNORMAL HIGH (ref 70–99)
Glucose-Capillary: 154 mg/dL — ABNORMAL HIGH (ref 70–99)
Glucose-Capillary: 161 mg/dL — ABNORMAL HIGH (ref 70–99)
Glucose-Capillary: 168 mg/dL — ABNORMAL HIGH (ref 70–99)
Glucose-Capillary: 202 mg/dL — ABNORMAL HIGH (ref 70–99)

## 2022-06-06 MED ORDER — FUROSEMIDE 10 MG/ML IJ SOLN
20.0000 mg | Freq: Once | INTRAMUSCULAR | Status: AC
  Administered 2022-06-06: 20 mg via INTRAVENOUS
  Filled 2022-06-06: qty 2

## 2022-06-06 MED ORDER — OXYCODONE HCL 5 MG PO TABS
10.0000 mg | ORAL_TABLET | Freq: Four times a day (QID) | ORAL | Status: DC
  Administered 2022-06-06 – 2022-06-10 (×15): 10 mg
  Filled 2022-06-06 (×18): qty 2

## 2022-06-06 MED ORDER — FENTANYL CITRATE PF 50 MCG/ML IJ SOSY
50.0000 ug | PREFILLED_SYRINGE | INTRAMUSCULAR | Status: DC | PRN
  Administered 2022-06-06 – 2022-06-09 (×9): 50 ug via INTRAVENOUS
  Filled 2022-06-06 (×10): qty 1

## 2022-06-06 MED ORDER — INSULIN ASPART 100 UNIT/ML IJ SOLN
0.0000 [IU] | INTRAMUSCULAR | Status: DC
  Administered 2022-06-06 – 2022-06-07 (×2): 3 [IU] via SUBCUTANEOUS
  Administered 2022-06-07: 4 [IU] via SUBCUTANEOUS
  Administered 2022-06-08 – 2022-06-11 (×12): 2 [IU] via SUBCUTANEOUS
  Administered 2022-06-11: 3 [IU] via SUBCUTANEOUS
  Administered 2022-06-12 (×2): 2 [IU] via SUBCUTANEOUS
  Administered 2022-06-12 (×2): 3 [IU] via SUBCUTANEOUS
  Administered 2022-06-12: 2 [IU] via SUBCUTANEOUS
  Administered 2022-06-13: 3 [IU] via SUBCUTANEOUS
  Administered 2022-06-13: 2 [IU] via SUBCUTANEOUS
  Administered 2022-06-13: 3 [IU] via SUBCUTANEOUS
  Administered 2022-06-13: 2 [IU] via SUBCUTANEOUS
  Administered 2022-06-13: 1 [IU] via SUBCUTANEOUS
  Administered 2022-06-13 – 2022-06-16 (×13): 2 [IU] via SUBCUTANEOUS
  Administered 2022-06-16: 3 [IU] via SUBCUTANEOUS
  Administered 2022-06-16 – 2022-06-18 (×7): 2 [IU] via SUBCUTANEOUS

## 2022-06-06 MED ORDER — BISACODYL 10 MG RE SUPP
10.0000 mg | Freq: Every day | RECTAL | Status: DC
  Administered 2022-06-07 – 2022-06-09 (×3): 10 mg via RECTAL
  Filled 2022-06-06 (×3): qty 1

## 2022-06-06 MED ORDER — POTASSIUM CHLORIDE 20 MEQ PO PACK
40.0000 meq | PACK | Freq: Once | ORAL | Status: AC
  Administered 2022-06-06: 40 meq
  Filled 2022-06-06: qty 2

## 2022-06-06 MED ORDER — SODIUM CHLORIDE 0.9 % IV SOLN
2.0000 g | Freq: Two times a day (BID) | INTRAVENOUS | Status: DC
  Administered 2022-06-07 – 2022-06-08 (×4): 2 g via INTRAVENOUS
  Filled 2022-06-06 (×4): qty 12.5

## 2022-06-06 MED ORDER — QUETIAPINE FUMARATE 25 MG PO TABS
50.0000 mg | ORAL_TABLET | Freq: Once | ORAL | Status: AC
  Administered 2022-06-06: 50 mg

## 2022-06-06 MED ORDER — QUETIAPINE FUMARATE 100 MG PO TABS
100.0000 mg | ORAL_TABLET | Freq: Two times a day (BID) | ORAL | Status: DC
  Administered 2022-06-06 – 2022-06-07 (×3): 100 mg
  Filled 2022-06-06 (×4): qty 1

## 2022-06-06 MED ORDER — DOCUSATE SODIUM 50 MG/5ML PO LIQD
100.0000 mg | Freq: Two times a day (BID) | ORAL | Status: DC
  Administered 2022-06-06 – 2022-06-17 (×12): 100 mg
  Filled 2022-06-06 (×13): qty 10
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
## 2022-06-07 ENCOUNTER — Inpatient Hospital Stay (HOSPITAL_COMMUNITY): Payer: Medicare PPO

## 2022-06-07 LAB — MRSA NEXT GEN BY PCR, NASAL: MRSA by PCR Next Gen: NOT DETECTED

## 2022-06-07 LAB — CBC
HCT: 28.3 % — ABNORMAL LOW (ref 39.0–52.0)
Hemoglobin: 9.7 g/dL — ABNORMAL LOW (ref 13.0–17.0)
MCH: 31 pg (ref 26.0–34.0)
MCHC: 34.3 g/dL (ref 30.0–36.0)
MCV: 90.4 fL (ref 80.0–100.0)
Platelets: 245 10*3/uL (ref 150–400)
RBC: 3.13 MIL/uL — ABNORMAL LOW (ref 4.22–5.81)
RDW: 18.1 % — ABNORMAL HIGH (ref 11.5–15.5)
WBC: 14.9 10*3/uL — ABNORMAL HIGH (ref 4.0–10.5)
nRBC: 10 % — ABNORMAL HIGH (ref 0.0–0.2)

## 2022-06-07 LAB — BASIC METABOLIC PANEL
Anion gap: 8 (ref 5–15)
BUN: 28 mg/dL — ABNORMAL HIGH (ref 8–23)
CO2: 25 mmol/L (ref 22–32)
Calcium: 8.6 mg/dL — ABNORMAL LOW (ref 8.9–10.3)
Chloride: 105 mmol/L (ref 98–111)
Creatinine, Ser: 1.22 mg/dL (ref 0.61–1.24)
GFR, Estimated: >60 mL/min (ref >60)
Glucose, Bld: 137 mg/dL — ABNORMAL HIGH (ref 70–99)
Potassium: 4 mmol/L (ref 3.5–5.1)
Sodium: 138 mmol/L (ref 135–145)

## 2022-06-07 LAB — GLUCOSE, CAPILLARY
Glucose-Capillary: 118 mg/dL — ABNORMAL HIGH (ref 70–99)
Glucose-Capillary: 118 mg/dL — ABNORMAL HIGH (ref 70–99)
Glucose-Capillary: 115 mg/dL — ABNORMAL HIGH (ref 70–99)
Glucose-Capillary: 131 mg/dL — ABNORMAL HIGH (ref 70–99)
Glucose-Capillary: 120 mg/dL — ABNORMAL HIGH (ref 70–99)
Glucose-Capillary: 104 mg/dL — ABNORMAL HIGH (ref 70–99)

## 2022-06-07 LAB — PHOSPHORUS: Phosphorus: 1.9 mg/dL — ABNORMAL LOW (ref 2.5–4.6)

## 2022-06-07 MED ORDER — SODIUM PHOSPHATES 45 MMOLE/15ML IV SOLN
30.0000 mmol | Freq: Once | INTRAVENOUS | Status: AC
  Administered 2022-06-07: 30 mmol via INTRAVENOUS
  Filled 2022-06-07: qty 10

## 2022-06-07 MED ORDER — GLYCOPYRROLATE 0.2 MG/ML IJ SOLN
0.1000 mg | Freq: Three times a day (TID) | INTRAMUSCULAR | Status: DC | PRN
  Administered 2022-06-07 – 2022-06-18 (×4): 0.1 mg via INTRAVENOUS
  Filled 2022-06-07 (×5): qty 1

## 2022-06-07 NOTE — Care Management (Signed)
Patient had episode of vomiting while RN was doing patient care. 1600 dose of Librium & Oxy held. Night RN to assess next dose. MD Kinsinger notified.   TF continue to be held due to minimal stool output per discussion with MD Kinsinger.   Daisey Must, RN
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
## 2022-06-08 ENCOUNTER — Inpatient Hospital Stay (HOSPITAL_COMMUNITY): Payer: Medicare PPO

## 2022-06-08 LAB — BASIC METABOLIC PANEL
Anion gap: 6 (ref 5–15)
BUN: 31 mg/dL — ABNORMAL HIGH (ref 8–23)
CO2: 26 mmol/L (ref 22–32)
Calcium: 8.9 mg/dL (ref 8.9–10.3)
Chloride: 106 mmol/L (ref 98–111)
Creatinine, Ser: 1.16 mg/dL (ref 0.61–1.24)
GFR, Estimated: >60 mL/min (ref >60)
Glucose, Bld: 108 mg/dL — ABNORMAL HIGH (ref 70–99)
Potassium: 3.9 mmol/L (ref 3.5–5.1)
Sodium: 138 mmol/L (ref 135–145)

## 2022-06-08 LAB — GLUCOSE, CAPILLARY
Glucose-Capillary: 109 mg/dL — ABNORMAL HIGH (ref 70–99)
Glucose-Capillary: 94 mg/dL (ref 70–99)
Glucose-Capillary: 122 mg/dL — ABNORMAL HIGH (ref 70–99)
Glucose-Capillary: 117 mg/dL — ABNORMAL HIGH (ref 70–99)
Glucose-Capillary: 104 mg/dL — ABNORMAL HIGH (ref 70–99)
Glucose-Capillary: 139 mg/dL — ABNORMAL HIGH (ref 70–99)

## 2022-06-08 LAB — CBC
HCT: 28.5 % — ABNORMAL LOW (ref 39.0–52.0)
Hemoglobin: 9.5 g/dL — ABNORMAL LOW (ref 13.0–17.0)
MCH: 30 pg (ref 26.0–34.0)
MCHC: 33.3 g/dL (ref 30.0–36.0)
MCV: 89.9 fL (ref 80.0–100.0)
Platelets: 271 10*3/uL (ref 150–400)
RBC: 3.17 MIL/uL — ABNORMAL LOW (ref 4.22–5.81)
RDW: 17.8 % — ABNORMAL HIGH (ref 11.5–15.5)
WBC: 17.3 10*3/uL — ABNORMAL HIGH (ref 4.0–10.5)
nRBC: 10.4 % — ABNORMAL HIGH (ref 0.0–0.2)

## 2022-06-08 LAB — PATHOLOGIST SMEAR REVIEW

## 2022-06-08 MED ORDER — LORAZEPAM 2 MG/ML IJ SOLN
1.0000 mg | Freq: Once | INTRAMUSCULAR | Status: AC
  Administered 2022-06-08: 1 mg via INTRAVENOUS
  Filled 2022-06-08: qty 1

## 2022-06-08 MED ORDER — MIDAZOLAM HCL 2 MG/2ML IJ SOLN
2.0000 mg | INTRAMUSCULAR | Status: DC | PRN
  Administered 2022-06-08 (×2): 2 mg via INTRAVENOUS
  Filled 2022-06-08 (×2): qty 2

## 2022-06-08 MED ORDER — POLYVINYL ALCOHOL 1.4 % OP SOLN
1.0000 [drp] | OPHTHALMIC | Status: DC | PRN
  Administered 2022-06-08 – 2022-06-10 (×2): 1 [drp] via OPHTHALMIC
  Filled 2022-06-08: qty 15

## 2022-06-08 MED ORDER — QUETIAPINE FUMARATE 25 MG PO TABS
150.0000 mg | ORAL_TABLET | Freq: Two times a day (BID) | ORAL | Status: DC
  Administered 2022-06-08 – 2022-06-09 (×4): 150 mg
  Filled 2022-06-08 (×5): qty 2

## 2022-06-08 MED ORDER — SODIUM CHLORIDE 0.9 % IV SOLN
1.0000 g | Freq: Three times a day (TID) | INTRAVENOUS | Status: DC
  Administered 2022-06-08 – 2022-06-13 (×15): 1 g via INTRAVENOUS
  Filled 2022-06-08 (×16): qty 1

## 2022-06-08 MED ORDER — LORAZEPAM 2 MG/ML IJ SOLN
INTRAMUSCULAR | Status: AC
  Administered 2022-06-08: 1 mg
  Filled 2022-06-08: qty 1

## 2022-06-08 MED ORDER — CHLORDIAZEPOXIDE HCL 5 MG PO CAPS
20.0000 mg | ORAL_CAPSULE | Freq: Four times a day (QID) | ORAL | Status: DC
  Administered 2022-06-08 – 2022-06-09 (×4): 20 mg
  Filled 2022-06-08 (×4): qty 4

## 2022-06-08 MED ORDER — VANCOMYCIN HCL 1750 MG/350ML IV SOLN
1750.0000 mg | Freq: Once | INTRAVENOUS | Status: AC
  Administered 2022-06-08: 1750 mg via INTRAVENOUS
  Filled 2022-06-08: qty 350

## 2022-06-08 MED ORDER — FUROSEMIDE 10 MG/ML IJ SOLN
40.0000 mg | Freq: Once | INTRAMUSCULAR | Status: AC
  Administered 2022-06-08: 40 mg via INTRAVENOUS
  Filled 2022-06-08: qty 4

## 2022-06-08 MED ORDER — LORAZEPAM 2 MG/ML IJ SOLN: 1.0000 mg | Freq: Once | INTRAMUSCULAR | Status: DC

## 2022-06-08 MED ORDER — LORAZEPAM 2 MG/ML IJ SOLN
1.0000 mg | INTRAMUSCULAR | Status: DC | PRN
  Administered 2022-06-08 – 2022-06-09 (×2): 1 mg via INTRAVENOUS
  Filled 2022-06-08 (×2): qty 1

## 2022-06-08 MED ORDER — VANCOMYCIN HCL 1250 MG/250ML IV SOLN: 1250.0000 mg | INTRAVENOUS | Status: DC
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
## 2022-06-08 NOTE — Progress Notes (Signed)
Tube feeds restarted at 20, do not increase per Trauma.
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
## 2022-06-08 NOTE — Progress Notes (Signed)
Trauma Event Note   Received call from primary RN, pt awake on vent. Max dose of precedex infusing, ativan given last at 1844 and unable to give more for four hours. Notified Dr. Zenia Resides, received order for ativan '1MG'$  once now, initiate propofol if ativan is not effective.   Last imported Vital Signs BP (!) 125/59   Pulse 73   Temp (!) 100.5 F (38.1 C) (Axillary) Comment: RN notified  Resp (!) 34   Ht '5\' 6"'$  (1.676 m)   Wt 180 lb 12.4 oz (82 kg)   SpO2 96%   BMI 29.18 kg/m   Trending CBC Recent Labs    06/06/22 0500 06/07/22 0637 06/08/22 0500  WBC 13.2* 14.9* 17.3*  HGB 10.1* 9.7* 9.5*  HCT 29.2* 28.3* 28.5*  PLT 233 245 271    Trending Coag's No results for input(s): "APTT", "INR" in the last 72 hours.  Trending BMET Recent Labs    06/06/22 1650 06/07/22 0637 06/08/22 0500  NA 136 138 138  K 3.3* 4.0 3.9  CL 103 105 106  CO2 '26 25 26  '$ BUN 27* 28* 31*  CREATININE 1.41* 1.22 1.16  GLUCOSE 285* 137* 108*      Jaelle Campanile O Letoya Stallone  Trauma Response RN  Please call TRN at [phone] for further assistance.

## 2022-06-09 ENCOUNTER — Inpatient Hospital Stay (HOSPITAL_COMMUNITY): Payer: Medicare PPO

## 2022-06-09 LAB — CBC
HCT: 24.8 % — ABNORMAL LOW (ref 39.0–52.0)
Hemoglobin: 8.6 g/dL — ABNORMAL LOW (ref 13.0–17.0)
MCH: 31.2 pg (ref 26.0–34.0)
MCHC: 34.7 g/dL (ref 30.0–36.0)
MCV: 89.9 fL (ref 80.0–100.0)
Platelets: 291 10*3/uL (ref 150–400)
RBC: 2.76 MIL/uL — ABNORMAL LOW (ref 4.22–5.81)
RDW: 17.2 % — ABNORMAL HIGH (ref 11.5–15.5)
WBC: 22.9 10*3/uL — ABNORMAL HIGH (ref 4.0–10.5)
nRBC: 8.5 % — ABNORMAL HIGH (ref 0.0–0.2)

## 2022-06-09 LAB — BASIC METABOLIC PANEL
Anion gap: 4 — ABNORMAL LOW (ref 5–15)
BUN: 37 mg/dL — ABNORMAL HIGH (ref 8–23)
CO2: 25 mmol/L (ref 22–32)
Calcium: 8.1 mg/dL — ABNORMAL LOW (ref 8.9–10.3)
Chloride: 110 mmol/L (ref 98–111)
Creatinine, Ser: 1.16 mg/dL (ref 0.61–1.24)
GFR, Estimated: >60 mL/min (ref >60)
Glucose, Bld: 123 mg/dL — ABNORMAL HIGH (ref 70–99)
Potassium: 3.1 mmol/L — ABNORMAL LOW (ref 3.5–5.1)
Sodium: 139 mmol/L (ref 135–145)

## 2022-06-09 LAB — GLUCOSE, CAPILLARY
Glucose-Capillary: 110 mg/dL — ABNORMAL HIGH (ref 70–99)
Glucose-Capillary: 127 mg/dL — ABNORMAL HIGH (ref 70–99)
Glucose-Capillary: 115 mg/dL — ABNORMAL HIGH (ref 70–99)
Glucose-Capillary: 131 mg/dL — ABNORMAL HIGH (ref 70–99)
Glucose-Capillary: 126 mg/dL — ABNORMAL HIGH (ref 70–99)
Glucose-Capillary: 111 mg/dL — ABNORMAL HIGH (ref 70–99)

## 2022-06-09 LAB — CULTURE, RESPIRATORY W GRAM STAIN

## 2022-06-09 LAB — PHOSPHORUS: Phosphorus: 2.7 mg/dL (ref 2.5–4.6)

## 2022-06-09 MED ORDER — SODIUM CHLORIDE 0.9 % IV BOLUS
500.0000 mL | Freq: Once | INTRAVENOUS | Status: AC
  Administered 2022-06-09: 500 mL via INTRAVENOUS

## 2022-06-09 MED ORDER — ACETYLCYSTEINE 20 % IN SOLN
3.0000 mL | Freq: Three times a day (TID) | RESPIRATORY_TRACT | Status: DC
  Administered 2022-06-09: 3 mL
  Filled 2022-06-09 (×4): qty 4

## 2022-06-09 MED ORDER — LORAZEPAM 2 MG/ML IJ SOLN
1.0000 mg | INTRAMUSCULAR | Status: DC | PRN
  Administered 2022-06-10 – 2022-06-18 (×12): 2 mg via INTRAVENOUS
  Filled 2022-06-09 (×13): qty 1

## 2022-06-09 MED ORDER — PROPOFOL 1000 MG/100ML IV EMUL
5.0000 ug/kg/min | INTRAVENOUS | Status: DC
  Administered 2022-06-09 (×4): 40 ug/kg/min via INTRAVENOUS
  Administered 2022-06-09: 30 ug/kg/min via INTRAVENOUS
  Administered 2022-06-10: 40 ug/kg/min via INTRAVENOUS
  Filled 2022-06-09 (×5): qty 100

## 2022-06-09 MED ORDER — POTASSIUM CHLORIDE 20 MEQ PO PACK
40.0000 meq | PACK | Freq: Two times a day (BID) | ORAL | Status: DC
  Administered 2022-06-09 – 2022-06-11 (×5): 40 meq
  Filled 2022-06-09 (×5): qty 2

## 2022-06-09 MED ORDER — PROPOFOL 1000 MG/100ML IV EMUL
INTRAVENOUS | Status: AC
  Filled 2022-06-09: qty 100

## 2022-06-09 MED ORDER — CHLORDIAZEPOXIDE HCL 25 MG PO CAPS
50.0000 mg | ORAL_CAPSULE | Freq: Four times a day (QID) | ORAL | Status: DC
  Administered 2022-06-09 – 2022-06-13 (×16): 50 mg
  Filled 2022-06-09: qty 10
  Filled 2022-06-09 (×2): qty 2
  Filled 2022-06-09 (×2): qty 10
  Filled 2022-06-09 (×2): qty 2
  Filled 2022-06-09 (×2): qty 10
  Filled 2022-06-09: qty 2
  Filled 2022-06-09: qty 10
  Filled 2022-06-09 (×5): qty 2

## 2022-06-09 MED ORDER — NOREPINEPHRINE 4 MG/250ML-% IV SOLN
0.0000 ug/min | INTRAVENOUS | Status: DC
  Administered 2022-06-09: 5 ug/min via INTRAVENOUS
  Administered 2022-06-09: 2 ug/min via INTRAVENOUS
  Filled 2022-06-09 (×2): qty 250

## 2022-06-09 MED ORDER — CHLORDIAZEPOXIDE HCL 5 MG PO CAPS
30.0000 mg | ORAL_CAPSULE | Freq: Once | ORAL | Status: AC
  Administered 2022-06-09: 30 mg
  Filled 2022-06-09: qty 6
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
## 2022-06-09 DEATH — deceased

## 2022-06-10 ENCOUNTER — Inpatient Hospital Stay (HOSPITAL_COMMUNITY): Payer: Medicare PPO

## 2022-06-10 LAB — BASIC METABOLIC PANEL
Anion gap: 11 (ref 5–15)
BUN: 34 mg/dL — ABNORMAL HIGH (ref 8–23)
CO2: 23 mmol/L (ref 22–32)
Calcium: 8.5 mg/dL — ABNORMAL LOW (ref 8.9–10.3)
Chloride: 109 mmol/L (ref 98–111)
Creatinine, Ser: 1.18 mg/dL (ref 0.61–1.24)
GFR, Estimated: >60 mL/min (ref >60)
Glucose, Bld: 129 mg/dL — ABNORMAL HIGH (ref 70–99)
Potassium: 4 mmol/L (ref 3.5–5.1)
Sodium: 143 mmol/L (ref 135–145)

## 2022-06-10 LAB — TRIGLYCERIDES: Triglycerides: 280 mg/dL — ABNORMAL HIGH (ref <150)

## 2022-06-10 LAB — CBC
HCT: 27.1 % — ABNORMAL LOW (ref 39.0–52.0)
Hemoglobin: 8.6 g/dL — ABNORMAL LOW (ref 13.0–17.0)
MCH: 29.7 pg (ref 26.0–34.0)
MCHC: 31.7 g/dL (ref 30.0–36.0)
MCV: 93.4 fL (ref 80.0–100.0)
Platelets: 352 10*3/uL (ref 150–400)
RBC: 2.9 MIL/uL — ABNORMAL LOW (ref 4.22–5.81)
RDW: 18.1 % — ABNORMAL HIGH (ref 11.5–15.5)
WBC: 27.3 10*3/uL — ABNORMAL HIGH (ref 4.0–10.5)
nRBC: 6.8 % — ABNORMAL HIGH (ref 0.0–0.2)

## 2022-06-10 LAB — GLUCOSE, CAPILLARY
Glucose-Capillary: 109 mg/dL — ABNORMAL HIGH (ref 70–99)
Glucose-Capillary: 116 mg/dL — ABNORMAL HIGH (ref 70–99)
Glucose-Capillary: 124 mg/dL — ABNORMAL HIGH (ref 70–99)
Glucose-Capillary: 107 mg/dL — ABNORMAL HIGH (ref 70–99)
Glucose-Capillary: 133 mg/dL — ABNORMAL HIGH (ref 70–99)
Glucose-Capillary: 135 mg/dL — ABNORMAL HIGH (ref 70–99)

## 2022-06-10 LAB — AMMONIA: Ammonia: 23 umol/L (ref 9–35)

## 2022-06-10 MED ORDER — IOHEXOL 350 MG/ML SOLN
75.0000 mL | Freq: Once | INTRAVENOUS | Status: AC | PRN
  Administered 2022-06-10: 75 mL via INTRAVENOUS

## 2022-06-10 MED ORDER — ALBUTEROL SULFATE (2.5 MG/3ML) 0.083% IN NEBU
2.5000 mg | INHALATION_SOLUTION | RESPIRATORY_TRACT | Status: DC | PRN
  Filled 2022-06-10: qty 3

## 2022-06-10 MED ORDER — FUROSEMIDE 10 MG/ML IJ SOLN
20.0000 mg | Freq: Once | INTRAMUSCULAR | Status: AC
  Administered 2022-06-10: 20 mg via INTRAVENOUS
  Filled 2022-06-10: qty 2

## 2022-06-10 MED ORDER — FENTANYL CITRATE PF 50 MCG/ML IJ SOSY
50.0000 ug | PREFILLED_SYRINGE | INTRAMUSCULAR | Status: DC | PRN
  Administered 2022-06-10 (×3): 100 ug via INTRAVENOUS
  Administered 2022-06-11: 50 ug via INTRAVENOUS
  Administered 2022-06-11 (×2): 100 ug via INTRAVENOUS
  Administered 2022-06-16 (×2): 50 ug via INTRAVENOUS
  Administered 2022-06-16 – 2022-06-17 (×2): 100 ug via INTRAVENOUS
  Filled 2022-06-10: qty 2
  Filled 2022-06-10 (×2): qty 1
  Filled 2022-06-10 (×6): qty 2
  Filled 2022-06-10: qty 1
  Filled 2022-06-10: qty 2

## 2022-06-10 MED ORDER — OXYCODONE HCL 5 MG PO TABS
5.0000 mg | ORAL_TABLET | ORAL | Status: DC | PRN
  Administered 2022-06-10: 10 mg
  Administered 2022-06-10: 5 mg
  Administered 2022-06-11: 10 mg
  Filled 2022-06-10: qty 1
  Filled 2022-06-10 (×2): qty 2

## 2022-06-10 MED ORDER — QUETIAPINE FUMARATE 100 MG PO TABS
100.0000 mg | ORAL_TABLET | Freq: Two times a day (BID) | ORAL | Status: DC
  Administered 2022-06-10 (×2): 100 mg
  Filled 2022-06-10 (×2): qty 1

## 2022-06-10 MED ORDER — ACETYLCYSTEINE 20 % IN SOLN
3.0000 mL | Freq: Three times a day (TID) | RESPIRATORY_TRACT | Status: DC
  Filled 2022-06-10 (×2): qty 4
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
## 2022-06-10 NOTE — Progress Notes (Signed)
Pt transported to and from 4N21 to CT3 on vent. Pt stable throughout with no complications. VS WNL

## 2022-06-11 ENCOUNTER — Inpatient Hospital Stay (HOSPITAL_COMMUNITY): Payer: Medicare PPO

## 2022-06-11 LAB — BASIC METABOLIC PANEL
Anion gap: 11 (ref 5–15)
BUN: 36 mg/dL — ABNORMAL HIGH (ref 8–23)
CO2: 26 mmol/L (ref 22–32)
Calcium: 8.4 mg/dL — ABNORMAL LOW (ref 8.9–10.3)
Chloride: 108 mmol/L (ref 98–111)
Creatinine, Ser: 0.93 mg/dL (ref 0.61–1.24)
GFR, Estimated: >60 mL/min (ref >60)
Glucose, Bld: 131 mg/dL — ABNORMAL HIGH (ref 70–99)
Potassium: 4.1 mmol/L (ref 3.5–5.1)
Sodium: 145 mmol/L (ref 135–145)

## 2022-06-11 LAB — GLUCOSE, CAPILLARY
Glucose-Capillary: 133 mg/dL — ABNORMAL HIGH (ref 70–99)
Glucose-Capillary: 143 mg/dL — ABNORMAL HIGH (ref 70–99)
Glucose-Capillary: 148 mg/dL — ABNORMAL HIGH (ref 70–99)
Glucose-Capillary: 153 mg/dL — ABNORMAL HIGH (ref 70–99)
Glucose-Capillary: 146 mg/dL — ABNORMAL HIGH (ref 70–99)
Glucose-Capillary: 132 mg/dL — ABNORMAL HIGH (ref 70–99)

## 2022-06-11 LAB — PHOSPHORUS: Phosphorus: 2.4 mg/dL — ABNORMAL LOW (ref 2.5–4.6)

## 2022-06-11 LAB — MAGNESIUM: Magnesium: 2.8 mg/dL — ABNORMAL HIGH (ref 1.7–2.4)

## 2022-06-11 MED ORDER — DEXMEDETOMIDINE HCL IN NACL 400 MCG/100ML IV SOLN
0.4000 ug/kg/h | INTRAVENOUS | Status: DC
  Administered 2022-06-11: 1.2 ug/kg/h via INTRAVENOUS
  Administered 2022-06-11: 1.6 ug/kg/h via INTRAVENOUS
  Administered 2022-06-11 (×3): 1.8 ug/kg/h via INTRAVENOUS
  Administered 2022-06-12 (×2): 1.6 ug/kg/h via INTRAVENOUS
  Administered 2022-06-12: 1 ug/kg/h via INTRAVENOUS
  Administered 2022-06-12: 1.5 ug/kg/h via INTRAVENOUS
  Administered 2022-06-12: 1 ug/kg/h via INTRAVENOUS
  Administered 2022-06-12: 1.3 ug/kg/h via INTRAVENOUS
  Administered 2022-06-13: 1 ug/kg/h via INTRAVENOUS
  Administered 2022-06-17: 0.4 ug/kg/h via INTRAVENOUS
  Administered 2022-06-17 (×2): 0.7 ug/kg/h via INTRAVENOUS
  Administered 2022-06-18: 0.8 ug/kg/h via INTRAVENOUS
  Administered 2022-06-18: 1.2 ug/kg/h via INTRAVENOUS
  Administered 2022-06-18: 0.6 ug/kg/h via INTRAVENOUS
  Filled 2022-06-11 (×8): qty 100
  Filled 2022-06-11: qty 200
  Filled 2022-06-11 (×6): qty 100

## 2022-06-11 MED ORDER — OXYCODONE HCL 5 MG PO TABS
10.0000 mg | ORAL_TABLET | Freq: Four times a day (QID) | ORAL | Status: DC
  Administered 2022-06-11 – 2022-06-18 (×29): 10 mg
  Filled 2022-06-11 (×29): qty 2

## 2022-06-11 MED ORDER — QUETIAPINE FUMARATE 200 MG PO TABS
200.0000 mg | ORAL_TABLET | Freq: Two times a day (BID) | ORAL | Status: DC
  Administered 2022-06-11 – 2022-06-18 (×15): 200 mg
  Filled 2022-06-11 (×15): qty 1

## 2022-06-11 MED ORDER — POTASSIUM PHOSPHATES 15 MMOLE/5ML IV SOLN
15.0000 mmol | Freq: Once | INTRAVENOUS | Status: AC
  Administered 2022-06-11: 15 mmol via INTRAVENOUS
  Filled 2022-06-11: qty 5

## 2022-06-11 NOTE — Procedures (Signed)
Cortrak  Person Inserting Tube:  Ranell Patrick D, RD Tube Type:  Cortrak - 43 inches Tube Size:  10 Tube Location:  Left nare Secured by: Bridle Technique Used to Measure Tube Placement:  Marking at nare/corner of mouth Cortrak Secured At:  67 cm Procedure Comments:  Cortrak Tube Team Note:  Consult received to place a Cortrak feeding tube.   X-ray is required, abdominal x-ray has been ordered by the Cortrak team. Please confirm tube placement before using the Cortrak tube.   If the tube becomes dislodged please keep the tube and contact the Cortrak team at [email or web address] for replacement.  If after hours and replacement cannot be delayed, place a NG tube and confirm placement with an abdominal x-ray.    Ranell Patrick, RD, LDN Clinical Dietitian RD pager # available in [place]  After hours/weekend pager # available in [facility]
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
## 2022-06-12 ENCOUNTER — Inpatient Hospital Stay (HOSPITAL_COMMUNITY): Payer: Medicare PPO

## 2022-06-12 LAB — BASIC METABOLIC PANEL
Anion gap: 6 (ref 5–15)
BUN: 32 mg/dL — ABNORMAL HIGH (ref 8–23)
CO2: 23 mmol/L (ref 22–32)
Calcium: 7.6 mg/dL — ABNORMAL LOW (ref 8.9–10.3)
Chloride: 111 mmol/L (ref 98–111)
Creatinine, Ser: 0.86 mg/dL (ref 0.61–1.24)
GFR, Estimated: >60 mL/min (ref >60)
Glucose, Bld: 127 mg/dL — ABNORMAL HIGH (ref 70–99)
Potassium: 4.1 mmol/L (ref 3.5–5.1)
Sodium: 140 mmol/L (ref 135–145)

## 2022-06-12 LAB — CBC
HCT: 25.8 % — ABNORMAL LOW (ref 39.0–52.0)
Hemoglobin: 8.6 g/dL — ABNORMAL LOW (ref 13.0–17.0)
MCH: 30.6 pg (ref 26.0–34.0)
MCHC: 33.3 g/dL (ref 30.0–36.0)
MCV: 91.8 fL (ref 80.0–100.0)
Platelets: 537 10*3/uL — ABNORMAL HIGH (ref 150–400)
RBC: 2.81 MIL/uL — ABNORMAL LOW (ref 4.22–5.81)
RDW: 17.6 % — ABNORMAL HIGH (ref 11.5–15.5)
WBC: 31.5 10*3/uL — ABNORMAL HIGH (ref 4.0–10.5)
nRBC: 1.9 % — ABNORMAL HIGH (ref 0.0–0.2)

## 2022-06-12 LAB — PHOSPHORUS: Phosphorus: 3 mg/dL (ref 2.5–4.6)

## 2022-06-12 LAB — GLUCOSE, CAPILLARY
Glucose-Capillary: 139 mg/dL — ABNORMAL HIGH (ref 70–99)
Glucose-Capillary: 110 mg/dL — ABNORMAL HIGH (ref 70–99)
Glucose-Capillary: 161 mg/dL — ABNORMAL HIGH (ref 70–99)
Glucose-Capillary: 152 mg/dL — ABNORMAL HIGH (ref 70–99)
Glucose-Capillary: 129 mg/dL — ABNORMAL HIGH (ref 70–99)
Glucose-Capillary: 146 mg/dL — ABNORMAL HIGH (ref 70–99)

## 2022-06-12 MED ORDER — BETHANECHOL CHLORIDE 25 MG PO TABS
25.0000 mg | ORAL_TABLET | Freq: Three times a day (TID) | ORAL | Status: DC
  Administered 2022-06-12 – 2022-06-18 (×19): 25 mg
  Filled 2022-06-12 (×19): qty 1

## 2022-06-12 MED ORDER — BANATROL TF EN LIQD
60.0000 mL | Freq: Two times a day (BID) | ENTERAL | Status: DC
  Administered 2022-06-12 – 2022-06-17 (×11): 60 mL
  Filled 2022-06-12 (×11): qty 60

## 2022-06-12 MED ORDER — FUROSEMIDE 10 MG/ML IJ SOLN
40.0000 mg | Freq: Once | INTRAMUSCULAR | Status: AC
  Administered 2022-06-12: 40 mg via INTRAVENOUS
  Filled 2022-06-12: qty 4
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
## 2022-06-12 NOTE — Progress Notes (Signed)
RT attempted to get sputum sample, not enough obtained through suctioning at this time. Will continue to obtain during next vent checks.
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
## 2022-06-13 ENCOUNTER — Inpatient Hospital Stay (HOSPITAL_COMMUNITY): Payer: Medicare PPO

## 2022-06-13 LAB — BASIC METABOLIC PANEL
Anion gap: 8 (ref 5–15)
BUN: 39 mg/dL — ABNORMAL HIGH (ref 8–23)
CO2: 29 mmol/L (ref 22–32)
Calcium: 8.1 mg/dL — ABNORMAL LOW (ref 8.9–10.3)
Chloride: 107 mmol/L (ref 98–111)
Creatinine, Ser: 0.99 mg/dL (ref 0.61–1.24)
GFR, Estimated: >60 mL/min (ref >60)
Glucose, Bld: 123 mg/dL — ABNORMAL HIGH (ref 70–99)
Potassium: 4.4 mmol/L (ref 3.5–5.1)
Sodium: 144 mmol/L (ref 135–145)

## 2022-06-13 LAB — GLUCOSE, CAPILLARY
Glucose-Capillary: 124 mg/dL — ABNORMAL HIGH (ref 70–99)
Glucose-Capillary: 139 mg/dL — ABNORMAL HIGH (ref 70–99)
Glucose-Capillary: 179 mg/dL — ABNORMAL HIGH (ref 70–99)
Glucose-Capillary: 172 mg/dL — ABNORMAL HIGH (ref 70–99)
Glucose-Capillary: 148 mg/dL — ABNORMAL HIGH (ref 70–99)
Glucose-Capillary: 147 mg/dL — ABNORMAL HIGH (ref 70–99)

## 2022-06-13 LAB — CBC
HCT: 27 % — ABNORMAL LOW (ref 39.0–52.0)
Hemoglobin: 8.8 g/dL — ABNORMAL LOW (ref 13.0–17.0)
MCH: 29.7 pg (ref 26.0–34.0)
MCHC: 32.6 g/dL (ref 30.0–36.0)
MCV: 91.2 fL (ref 80.0–100.0)
Platelets: 718 10*3/uL — ABNORMAL HIGH (ref 150–400)
RBC: 2.96 MIL/uL — ABNORMAL LOW (ref 4.22–5.81)
RDW: 17.2 % — ABNORMAL HIGH (ref 11.5–15.5)
WBC: 28.4 10*3/uL — ABNORMAL HIGH (ref 4.0–10.5)
nRBC: 0.7 % — ABNORMAL HIGH (ref 0.0–0.2)

## 2022-06-13 MED ORDER — SUCCINYLCHOLINE CHLORIDE 200 MG/10ML IV SOSY
100.0000 mg | PREFILLED_SYRINGE | Freq: Once | INTRAVENOUS | Status: AC
  Administered 2022-06-13: 100 mg via INTRAVENOUS

## 2022-06-13 MED ORDER — FUROSEMIDE 10 MG/ML IJ SOLN
40.0000 mg | Freq: Once | INTRAMUSCULAR | Status: AC
  Administered 2022-06-13: 40 mg via INTRAVENOUS
  Filled 2022-06-13: qty 4

## 2022-06-13 MED ORDER — SULFAMETHOXAZOLE-TRIMETHOPRIM 400-80 MG/5ML IV SOLN
20.0000 mg/kg/d | Freq: Four times a day (QID) | INTRAVENOUS | Status: DC
  Administered 2022-06-13 – 2022-06-18 (×20): 389.44 mg via INTRAVENOUS
  Filled 2022-06-13: qty 10
  Filled 2022-06-13 (×3): qty 24.34
  Filled 2022-06-13: qty 10
  Filled 2022-06-13 (×20): qty 24.34

## 2022-06-13 MED ORDER — FUROSEMIDE 10 MG/ML IJ SOLN
60.0000 mg | Freq: Once | INTRAMUSCULAR | Status: AC
  Administered 2022-06-13: 60 mg via INTRAVENOUS
  Filled 2022-06-13: qty 6

## 2022-06-13 MED ORDER — CHLORDIAZEPOXIDE HCL 25 MG PO CAPS
25.0000 mg | ORAL_CAPSULE | Freq: Four times a day (QID) | ORAL | Status: DC
  Administered 2022-06-13 – 2022-06-14 (×3): 25 mg
  Filled 2022-06-13 (×3): qty 1

## 2022-06-13 MED ORDER — SODIUM CHLORIDE 0.9 % IV SOLN
2.0000 g | Freq: Three times a day (TID) | INTRAVENOUS | Status: DC
  Administered 2022-06-13 – 2022-06-18 (×15): 2 g via INTRAVENOUS
  Filled 2022-06-13 (×18): qty 2

## 2022-06-13 MED ORDER — SUCCINYLCHOLINE CHLORIDE 200 MG/10ML IV SOSY
PREFILLED_SYRINGE | INTRAVENOUS | Status: AC
  Filled 2022-06-13: qty 10

## 2022-06-13 NOTE — Progress Notes (Signed)
Audible cuff leak heard from pts doorway. Attempted to add air with no success. RT x2 and Dr. Bobbye Morton exchanged pts ETT to another size 8.0 at 26 @ the lip.

## 2022-06-13 NOTE — Procedures (Signed)
Intubation Procedure Note  Connor Cromartie Sr.  [id number]  Oct 12, 1945  Date:06/13/22  Time:4:45 PM   Provider Performing: Jesusita Oka    Procedure: Intubation (83419)  Indication(s) Blown ETT cuff, tube exchange  Consent Risks of the procedure as well as the alternatives and risks of each were explained to the patient and/or caregiver.  Consent for the procedure was obtained and is signed in the bedside chart   Anesthesia Succinylcholine   Time Out Verified patient identification, verified procedure, site/side was marked, verified correct patient position, special equipment/implants available, medications/allergies/relevant history reviewed, required imaging and test results available.   Sterile Technique Usual hand hygeine, masks, and gloves were used   Procedure Description Patient positioned in bed supine.  Sedation given as noted above.  Patient was intubated with endotracheal tube using Glidescope after removal of existing ETT.  View was Grade 1 full glottis .  Number of attempts was 1.  Colorimetric CO2 detector was consistent with tracheal placement.   Complications/Tolerance None; patient tolerated the procedure well. Chest X-ray is ordered to verify placement.   EBL None   Specimen(s) None
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
## 2022-06-13 NOTE — Progress Notes (Signed)
RT noticed ETT at 23 at the lip, when it is supposed to be at 26 at the lip. RT x2 advanced ETT back to 26 @ the lip.

## 2022-06-14 LAB — GLUCOSE, CAPILLARY
Glucose-Capillary: 121 mg/dL — ABNORMAL HIGH (ref 70–99)
Glucose-Capillary: 119 mg/dL — ABNORMAL HIGH (ref 70–99)
Glucose-Capillary: 148 mg/dL — ABNORMAL HIGH (ref 70–99)
Glucose-Capillary: 148 mg/dL — ABNORMAL HIGH (ref 70–99)
Glucose-Capillary: 117 mg/dL — ABNORMAL HIGH (ref 70–99)
Glucose-Capillary: 130 mg/dL — ABNORMAL HIGH (ref 70–99)

## 2022-06-14 LAB — CBC
HCT: 24.6 % — ABNORMAL LOW (ref 39.0–52.0)
Hemoglobin: 7.8 g/dL — ABNORMAL LOW (ref 13.0–17.0)
MCH: 29.8 pg (ref 26.0–34.0)
MCHC: 31.7 g/dL (ref 30.0–36.0)
MCV: 93.9 fL (ref 80.0–100.0)
Platelets: 817 10*3/uL — ABNORMAL HIGH (ref 150–400)
RBC: 2.62 MIL/uL — ABNORMAL LOW (ref 4.22–5.81)
RDW: 18 % — ABNORMAL HIGH (ref 11.5–15.5)
WBC: 23.4 10*3/uL — ABNORMAL HIGH (ref 4.0–10.5)
nRBC: 0.4 % — ABNORMAL HIGH (ref 0.0–0.2)

## 2022-06-14 LAB — BASIC METABOLIC PANEL
Anion gap: 9 (ref 5–15)
BUN: 44 mg/dL — ABNORMAL HIGH (ref 8–23)
CO2: 30 mmol/L (ref 22–32)
Calcium: 7.8 mg/dL — ABNORMAL LOW (ref 8.9–10.3)
Chloride: 103 mmol/L (ref 98–111)
Creatinine, Ser: 1.12 mg/dL (ref 0.61–1.24)
GFR, Estimated: >60 mL/min (ref >60)
Glucose, Bld: 149 mg/dL — ABNORMAL HIGH (ref 70–99)
Potassium: 4.3 mmol/L (ref 3.5–5.1)
Sodium: 142 mmol/L (ref 135–145)

## 2022-06-14 MED ORDER — CHLORDIAZEPOXIDE HCL 25 MG PO CAPS
25.0000 mg | ORAL_CAPSULE | Freq: Three times a day (TID) | ORAL | Status: DC
  Administered 2022-06-14 – 2022-06-18 (×13): 25 mg
  Filled 2022-06-14 (×13): qty 1

## 2022-06-14 MED ORDER — FUROSEMIDE 10 MG/ML IJ SOLN
60.0000 mg | Freq: Once | INTRAMUSCULAR | Status: AC
  Administered 2022-06-14: 60 mg via INTRAVENOUS
  Filled 2022-06-14: qty 6
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
## 2022-06-15 LAB — CULTURE, RESPIRATORY W GRAM STAIN

## 2022-06-15 LAB — GLUCOSE, CAPILLARY
Glucose-Capillary: 149 mg/dL — ABNORMAL HIGH (ref 70–99)
Glucose-Capillary: 139 mg/dL — ABNORMAL HIGH (ref 70–99)
Glucose-Capillary: 130 mg/dL — ABNORMAL HIGH (ref 70–99)
Glucose-Capillary: 122 mg/dL — ABNORMAL HIGH (ref 70–99)
Glucose-Capillary: 146 mg/dL — ABNORMAL HIGH (ref 70–99)
Glucose-Capillary: 122 mg/dL — ABNORMAL HIGH (ref 70–99)

## 2022-06-15 MED ORDER — FUROSEMIDE 10 MG/ML IJ SOLN
40.0000 mg | Freq: Once | INTRAMUSCULAR | Status: AC
  Administered 2022-06-15: 40 mg via INTRAVENOUS
  Filled 2022-06-15: qty 4

## 2022-06-15 NOTE — Progress Notes (Signed)
Noted patient's urine to be darker, cloudier, and have a significant amount of sediment in it compared to the previous two nights.  Notified on call Trauma MD, Dr. Dema Severin.  No new orders at this time.  Per MD, will defer to rounding Trauma team in the morning.
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
## 2022-06-16 ENCOUNTER — Inpatient Hospital Stay (HOSPITAL_COMMUNITY): Payer: Medicare PPO

## 2022-06-16 LAB — GLUCOSE, CAPILLARY
Glucose-Capillary: 121 mg/dL — ABNORMAL HIGH (ref 70–99)
Glucose-Capillary: 117 mg/dL — ABNORMAL HIGH (ref 70–99)
Glucose-Capillary: 162 mg/dL — ABNORMAL HIGH (ref 70–99)
Glucose-Capillary: 133 mg/dL — ABNORMAL HIGH (ref 70–99)
Glucose-Capillary: 130 mg/dL — ABNORMAL HIGH (ref 70–99)
Glucose-Capillary: 131 mg/dL — ABNORMAL HIGH (ref 70–99)
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
## 2022-06-16 NOTE — Progress Notes (Signed)
Patient ID: Connor Pontiff Sr., male   DOB: 1945-11-21, 77 y.o.   MRN: [id number] Dr. Bobbye Morton, [place] nurse for today, and I met with multiple family members to discuss goals of care. After a detailed discussion, the family wishes to proceed with one way extubation, possibly tomorrow. If he does well with extubation, he will continue to be supported. If he does not do well, we will transition to comfort care at that time. For now we will make him DNR.  Georganna Skeans, MD, MPH, FACS Please use [email or web address] to contact on call provider

## 2022-06-17 LAB — GLUCOSE, CAPILLARY
Glucose-Capillary: 138 mg/dL — ABNORMAL HIGH (ref 70–99)
Glucose-Capillary: 135 mg/dL — ABNORMAL HIGH (ref 70–99)
Glucose-Capillary: 108 mg/dL — ABNORMAL HIGH (ref 70–99)
Glucose-Capillary: 119 mg/dL — ABNORMAL HIGH (ref 70–99)
Glucose-Capillary: 110 mg/dL — ABNORMAL HIGH (ref 70–99)
Glucose-Capillary: 131 mg/dL — ABNORMAL HIGH (ref 70–99)

## 2022-06-17 LAB — BASIC METABOLIC PANEL
Anion gap: 10 (ref 5–15)
Anion gap: 9 (ref 5–15)
BUN: 27 mg/dL — ABNORMAL HIGH (ref 8–23)
BUN: 29 mg/dL — ABNORMAL HIGH (ref 8–23)
CO2: 27 mmol/L (ref 22–32)
CO2: 27 mmol/L (ref 22–32)
Calcium: 8.6 mg/dL — ABNORMAL LOW (ref 8.9–10.3)
Calcium: 8.3 mg/dL — ABNORMAL LOW (ref 8.9–10.3)
Chloride: 96 mmol/L — ABNORMAL LOW (ref 98–111)
Chloride: 96 mmol/L — ABNORMAL LOW (ref 98–111)
Creatinine, Ser: 1.03 mg/dL (ref 0.61–1.24)
Creatinine, Ser: 0.97 mg/dL (ref 0.61–1.24)
GFR, Estimated: >60 mL/min (ref >60)
GFR, Estimated: >60 mL/min (ref >60)
Glucose, Bld: 113 mg/dL — ABNORMAL HIGH (ref 70–99)
Glucose, Bld: 130 mg/dL — ABNORMAL HIGH (ref 70–99)
Potassium: 5.9 mmol/L — ABNORMAL HIGH (ref 3.5–5.1)
Potassium: 5.5 mmol/L — ABNORMAL HIGH (ref 3.5–5.1)
Sodium: 133 mmol/L — ABNORMAL LOW (ref 135–145)
Sodium: 132 mmol/L — ABNORMAL LOW (ref 135–145)

## 2022-06-17 LAB — CBC
HCT: 28.1 % — ABNORMAL LOW (ref 39.0–52.0)
Hemoglobin: 9.5 g/dL — ABNORMAL LOW (ref 13.0–17.0)
MCH: 30.8 pg (ref 26.0–34.0)
MCHC: 33.8 g/dL (ref 30.0–36.0)
MCV: 91.2 fL (ref 80.0–100.0)
Platelets: 1400 10*3/uL (ref 150–400)
RBC: 3.08 MIL/uL — ABNORMAL LOW (ref 4.22–5.81)
RDW: 16.8 % — ABNORMAL HIGH (ref 11.5–15.5)
WBC: 20 10*3/uL — ABNORMAL HIGH (ref 4.0–10.5)
nRBC: 0.1 % (ref 0.0–0.2)

## 2022-06-17 MED ORDER — NUTRISOURCE FIBER PO PACK
1.0000 | PACK | Freq: Three times a day (TID) | ORAL | Status: DC
  Administered 2022-06-17 – 2022-06-18 (×3): 1
  Filled 2022-06-17 (×3): qty 1

## 2022-06-17 MED ORDER — SODIUM ZIRCONIUM CYCLOSILICATE 10 G PO PACK
10.0000 g | PACK | Freq: Once | ORAL | Status: AC
  Administered 2022-06-17: 10 g
  Filled 2022-06-17: qty 1
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
## 2022-06-17 NOTE — Progress Notes (Signed)
Platelets 1400. Trauma notified.
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
## 2022-06-18 LAB — BASIC METABOLIC PANEL
Anion gap: 7 (ref 5–15)
BUN: 33 mg/dL — ABNORMAL HIGH (ref 8–23)
CO2: 26 mmol/L (ref 22–32)
Calcium: 8.5 mg/dL — ABNORMAL LOW (ref 8.9–10.3)
Chloride: 100 mmol/L (ref 98–111)
Creatinine, Ser: 1.1 mg/dL (ref 0.61–1.24)
GFR, Estimated: >60 mL/min (ref >60)
Glucose, Bld: 110 mg/dL — ABNORMAL HIGH (ref 70–99)
Potassium: 5.7 mmol/L — ABNORMAL HIGH (ref 3.5–5.1)
Sodium: 133 mmol/L — ABNORMAL LOW (ref 135–145)

## 2022-06-18 LAB — GLUCOSE, CAPILLARY
Glucose-Capillary: 116 mg/dL — ABNORMAL HIGH (ref 70–99)
Glucose-Capillary: 124 mg/dL — ABNORMAL HIGH (ref 70–99)
Glucose-Capillary: 108 mg/dL — ABNORMAL HIGH (ref 70–99)

## 2022-06-18 LAB — PATHOLOGIST SMEAR REVIEW

## 2022-06-18 MED ORDER — MORPHINE 100MG IN NS 100ML (1MG/ML) PREMIX INFUSION
0.0000 mg/h | INTRAVENOUS | Status: DC
  Administered 2022-06-18: 5 mg/h via INTRAVENOUS
  Filled 2022-06-18: qty 100

## 2022-06-18 MED ORDER — GLYCOPYRROLATE 1 MG PO TABS: 1.0000 mg | ORAL_TABLET | ORAL | Status: DC | PRN

## 2022-06-18 MED ORDER — MORPHINE BOLUS VIA INFUSION: 2.0000 mg | INTRAVENOUS | Status: DC | PRN

## 2022-06-18 MED ORDER — SODIUM CHLORIDE 0.9 % IV SOLN: INTRAVENOUS | Status: DC

## 2022-06-18 MED ORDER — GLYCOPYRROLATE 0.2 MG/ML IJ SOLN
0.2000 mg | INTRAMUSCULAR | Status: DC | PRN
  Administered 2022-06-18: 0.2 mg via INTRAVENOUS

## 2022-06-18 MED ORDER — SODIUM CHLORIDE 1 G PO TABS: 2.0000 g | ORAL_TABLET | Freq: Two times a day (BID) | ORAL | Status: DC

## 2022-06-18 MED ORDER — GLYCOPYRROLATE 0.2 MG/ML IJ SOLN: 0.2000 mg | INTRAMUSCULAR | Status: DC | PRN

## 2022-06-18 MED ORDER — ACETAMINOPHEN 650 MG RE SUPP: 650.0000 mg | Freq: Four times a day (QID) | RECTAL | Status: DC | PRN

## 2022-06-18 MED ORDER — ACETAMINOPHEN 325 MG PO TABS: 650.0000 mg | ORAL_TABLET | Freq: Four times a day (QID) | ORAL | Status: DC | PRN

## 2022-06-18 MED ORDER — IPRATROPIUM-ALBUTEROL 0.5-2.5 (3) MG/3ML IN SOLN
3.0000 mL | RESPIRATORY_TRACT | Status: DC
  Administered 2022-06-18: 3 mL via RESPIRATORY_TRACT
  Filled 2022-06-18 (×2): qty 3

## 2022-06-18 MED ORDER — SODIUM ZIRCONIUM CYCLOSILICATE 10 G PO PACK
10.0000 g | PACK | Freq: Once | ORAL | Status: AC
  Administered 2022-06-18: 10 g
  Filled 2022-06-18: qty 1

## 2022-06-18 MED ORDER — POLYVINYL ALCOHOL 1.4 % OP SOLN: 1.0000 [drp] | Freq: Four times a day (QID) | OPHTHALMIC | Status: DC | PRN

## 2022-06-18 NOTE — Progress Notes (Signed)
Patient deteriorating despite aggressive pulmonary toilet, now on NRB. Sats okay, but significantly increased WOB with accessory muscle use. Expect impending respiratory failure. Discussed with family that bipap is not an option due to facial hair, cortrak, and decreased mentation prevention ability to remove. We discussed transitioning him to comfort care and family is agreeable.  Additional critical care time: 73mn  AJesusita Oka MD General and [facility]
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
## 2022-06-20 LAB — CBC
HCT: 25.4 % — ABNORMAL LOW (ref 39.0–52.0)
Hemoglobin: 8.1 g/dL — ABNORMAL LOW (ref 13.0–17.0)
MCH: 29.7 pg (ref 26.0–34.0)
MCHC: 31.9 g/dL (ref 30.0–36.0)
MCV: 93 fL (ref 80.0–100.0)
Platelets: 1431 10*3/uL (ref 150–400)
RBC: 2.73 MIL/uL — ABNORMAL LOW (ref 4.22–5.81)
RDW: 16.4 % — ABNORMAL HIGH (ref 11.5–15.5)
WBC: 17.6 10*3/uL — ABNORMAL HIGH (ref 4.0–10.5)
nRBC: 0.2 % (ref 0.0–0.2)
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
## 2022-07-10 NOTE — Progress Notes (Signed)
Patient went asystole on 06/18/22 at 2339. Confirmed with Kennieth Rad RN. Dr Michaelle Birks notified.

## 2022-07-10 DEATH — deceased
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
## 2023-05-06 IMAGING — DX DG CHEST 2V
2 series · 2 of 2 positions shown · non-contrast
Comparison: Chest x-ray dated February 14, 2019

CLINICAL DATA: Cough

EXAM:
CHEST - 2 VIEW

[chest lat]
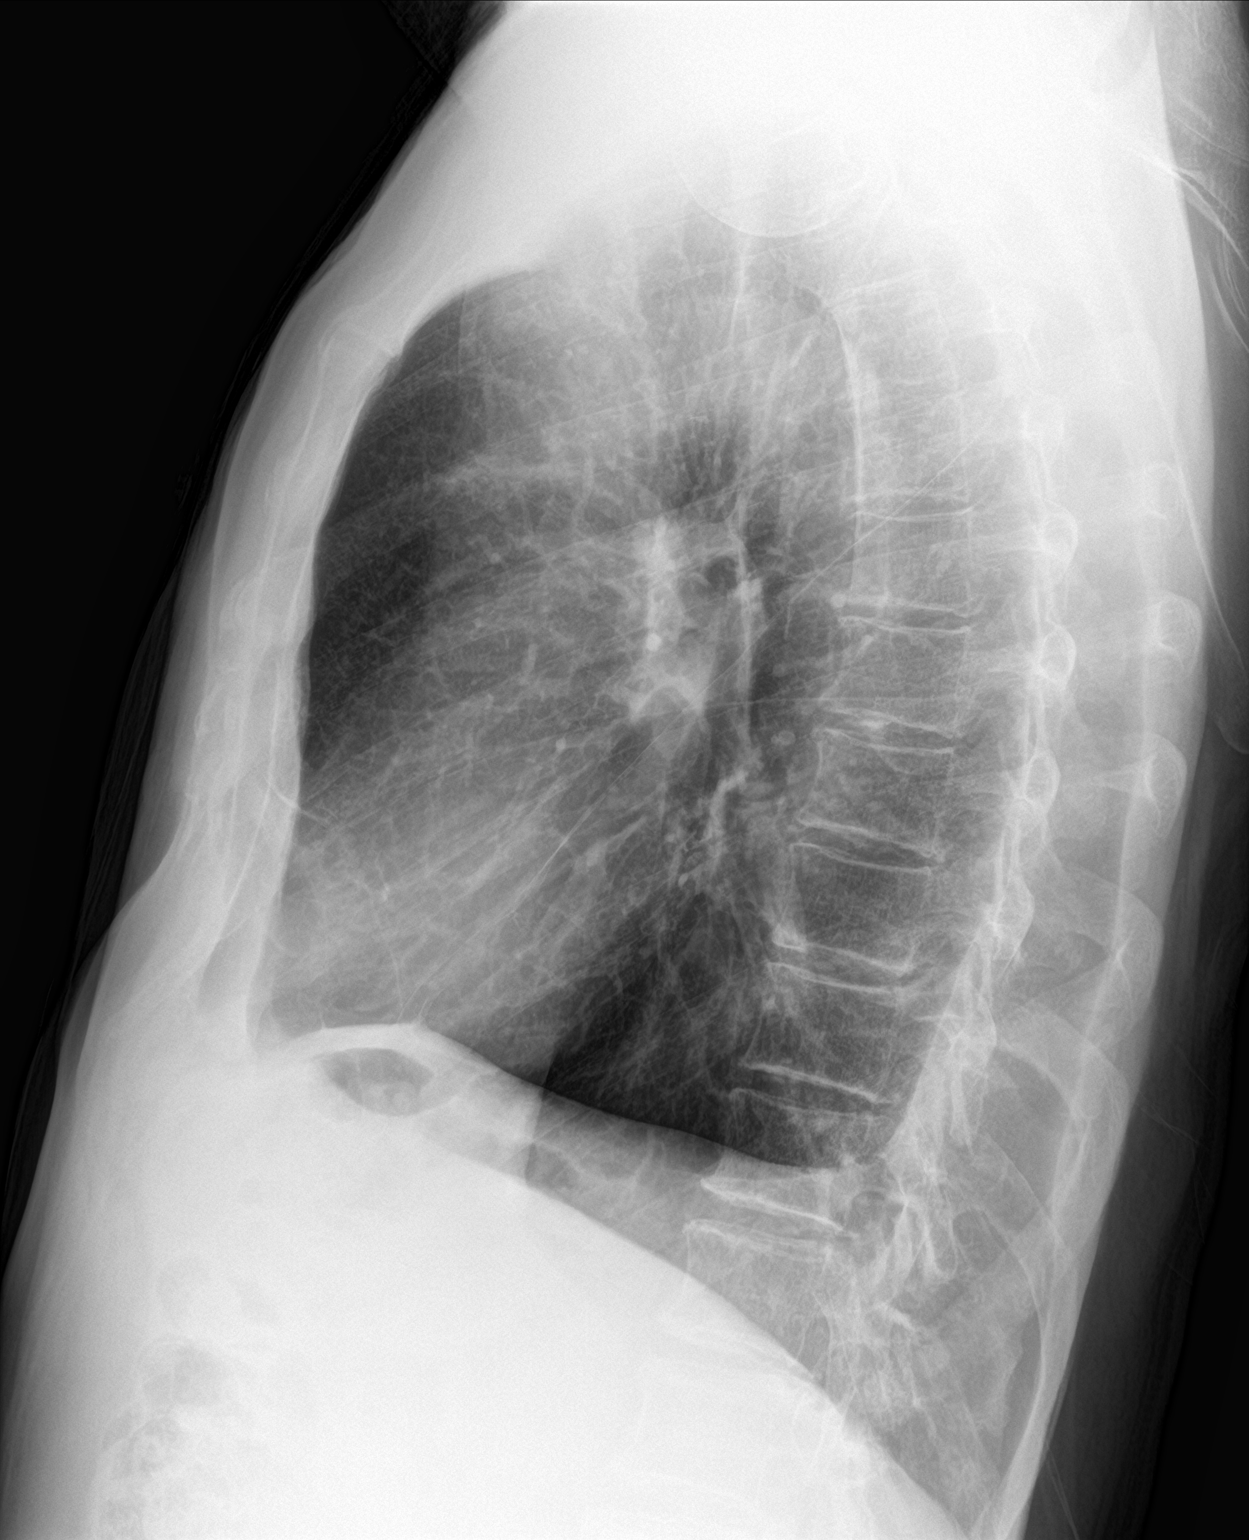

[chest pa]
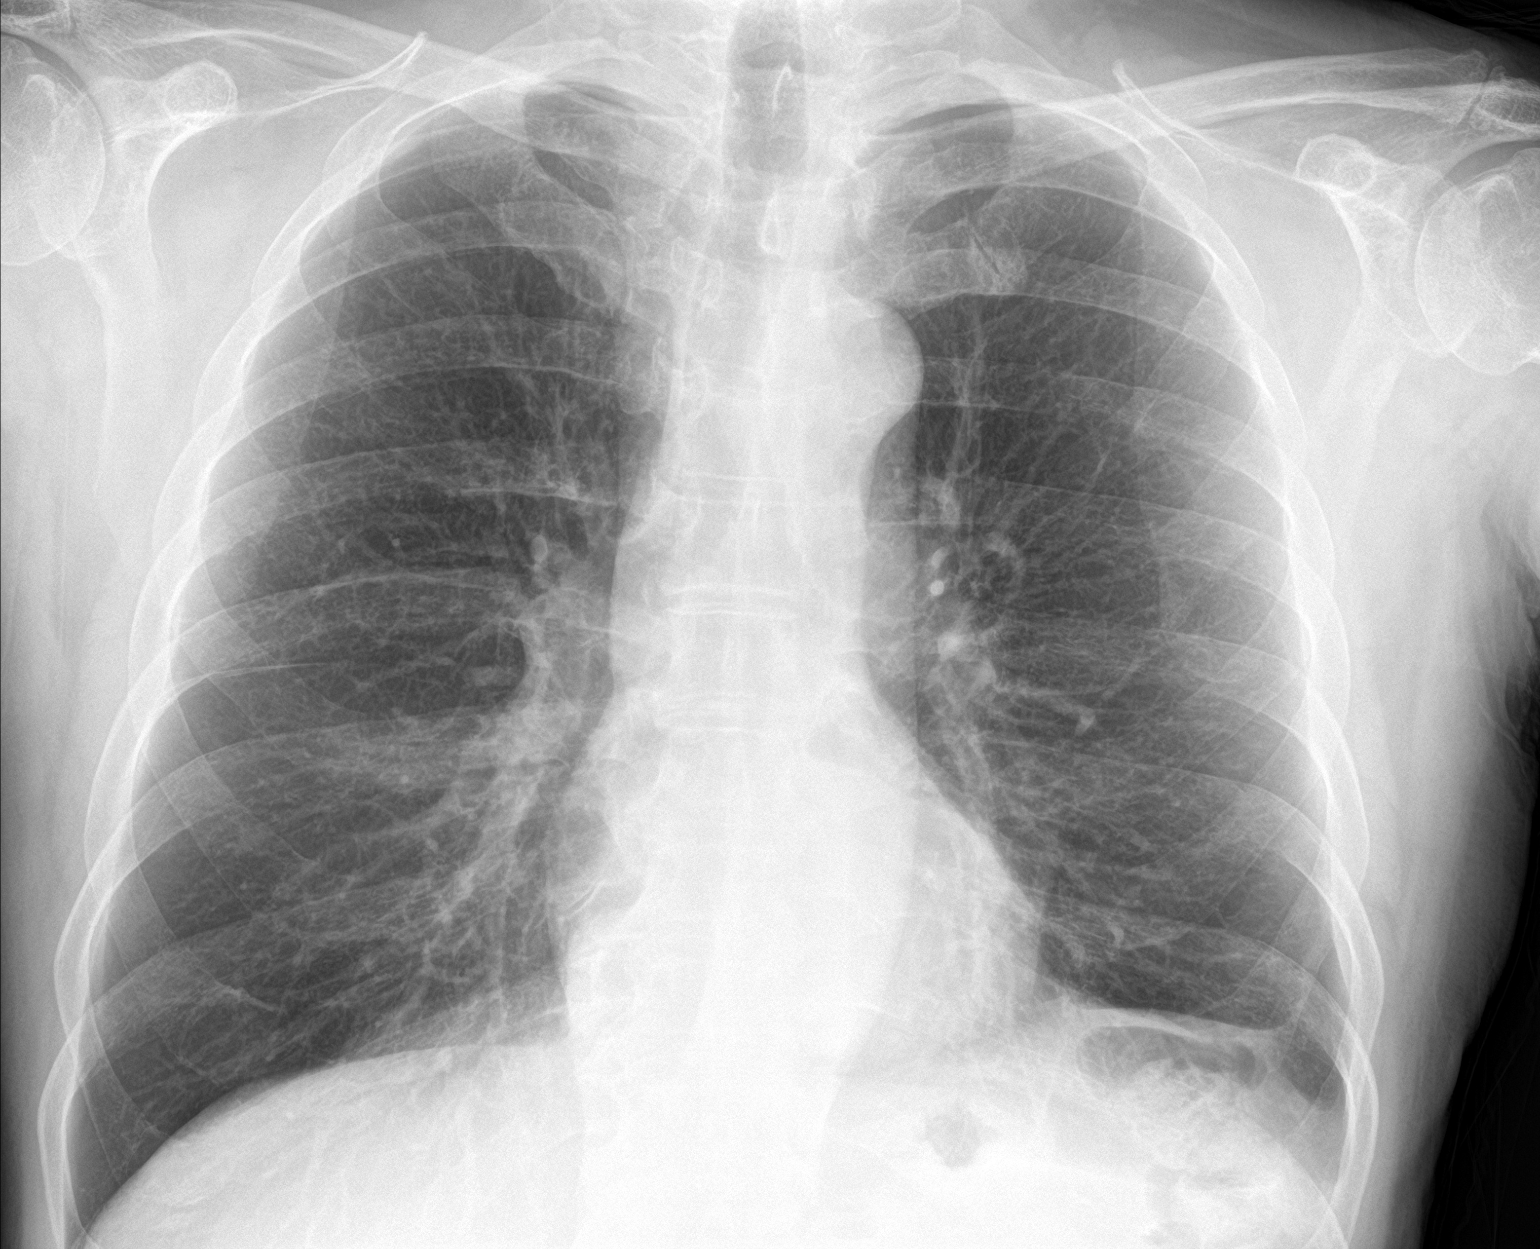

[2 of 2 positions shown; findings below may reference images not displayed]

FINDINGS: Cardiac and mediastinal contours are unchanged and within normal
limits. Lungs are clear. Unchanged blunting of the left costophrenic
angle, likely due to pleural thickening. No evidence of pleural
effusion or pneumothorax.
IMPRESSION: No active cardiopulmonary disease.
